# Patient Record
Sex: Female | Born: 1955 | Race: White | Hispanic: No | Smoking: Never smoker
Health system: Southern US, Community
[De-identification: ages and names within clinical notes are randomized; demographics above are authoritative.]

## PROBLEM LIST (undated history)

## (undated) DIAGNOSIS — L02211 Cutaneous abscess of abdominal wall: Secondary | ICD-10-CM

## (undated) DIAGNOSIS — Z9884 Bariatric surgery status: Secondary | ICD-10-CM

## (undated) DIAGNOSIS — E782 Mixed hyperlipidemia: Secondary | ICD-10-CM

## (undated) DIAGNOSIS — I10 Essential (primary) hypertension: Secondary | ICD-10-CM

## (undated) DIAGNOSIS — F419 Anxiety disorder, unspecified: Secondary | ICD-10-CM

## (undated) DIAGNOSIS — M797 Fibromyalgia: Secondary | ICD-10-CM

## (undated) DIAGNOSIS — F4323 Adjustment disorder with mixed anxiety and depressed mood: Secondary | ICD-10-CM

## (undated) HISTORY — PX: TONSILLECTOMY: SUR1361

## (undated) HISTORY — DX: Bariatric surgery status: Z98.84

## (undated) HISTORY — PX: APPENDECTOMY: SHX54

## (undated) HISTORY — DX: Fibromyalgia: M79.7

## (undated) HISTORY — DX: Essential (primary) hypertension: I10

## (undated) HISTORY — DX: Mixed hyperlipidemia: E78.2

## (undated) HISTORY — DX: Adjustment disorder with mixed anxiety and depressed mood: F43.23

## (undated) HISTORY — PX: GASTRIC BYPASS: SHX52

## (undated) HISTORY — DX: Anxiety disorder, unspecified: F41.9

## (undated) HISTORY — PX: WISDOM TOOTH EXTRACTION: SHX21

## (undated) HISTORY — PX: OTHER SURGICAL HISTORY: SHX169

---

## 2001-02-22 ENCOUNTER — Encounter: Admission: RE | Admit: 2001-02-22 | Discharge: 2001-02-22 | Payer: Self-pay | Admitting: Family Medicine

## 2001-02-22 ENCOUNTER — Encounter: Payer: Self-pay | Admitting: Family Medicine

## 2001-03-16 ENCOUNTER — Encounter: Payer: Self-pay | Admitting: Family Medicine

## 2001-03-16 ENCOUNTER — Encounter: Admission: RE | Admit: 2001-03-16 | Discharge: 2001-03-16 | Payer: Self-pay | Admitting: Family Medicine

## 2002-05-02 ENCOUNTER — Encounter: Payer: Self-pay | Admitting: Family Medicine

## 2002-05-02 ENCOUNTER — Encounter: Admission: RE | Admit: 2002-05-02 | Discharge: 2002-05-02 | Payer: Self-pay | Admitting: Family Medicine

## 2003-10-28 ENCOUNTER — Ambulatory Visit (HOSPITAL_COMMUNITY): Admission: RE | Admit: 2003-10-28 | Discharge: 2003-10-28 | Payer: Self-pay | Admitting: Internal Medicine

## 2004-02-07 ENCOUNTER — Other Ambulatory Visit: Admission: RE | Admit: 2004-02-07 | Discharge: 2004-02-07 | Payer: Self-pay | Admitting: Internal Medicine

## 2004-10-18 DIAGNOSIS — M797 Fibromyalgia: Secondary | ICD-10-CM

## 2004-10-18 HISTORY — DX: Fibromyalgia: M79.7

## 2004-10-18 HISTORY — PX: EXPLORATORY LAPAROTOMY: SUR591

## 2004-12-21 ENCOUNTER — Ambulatory Visit (HOSPITAL_COMMUNITY): Admission: RE | Admit: 2004-12-21 | Discharge: 2004-12-21 | Payer: Self-pay | Admitting: Internal Medicine

## 2005-01-05 ENCOUNTER — Ambulatory Visit (HOSPITAL_COMMUNITY): Admission: RE | Admit: 2005-01-05 | Discharge: 2005-01-05 | Payer: Self-pay | Admitting: Surgery

## 2005-01-05 ENCOUNTER — Encounter (INDEPENDENT_AMBULATORY_CARE_PROVIDER_SITE_OTHER): Payer: Self-pay | Admitting: Cardiology

## 2005-01-05 ENCOUNTER — Ambulatory Visit (HOSPITAL_COMMUNITY): Admission: RE | Admit: 2005-01-05 | Discharge: 2005-01-05 | Payer: Self-pay | Admitting: Internal Medicine

## 2005-01-05 ENCOUNTER — Ambulatory Visit (HOSPITAL_BASED_OUTPATIENT_CLINIC_OR_DEPARTMENT_OTHER): Admission: RE | Admit: 2005-01-05 | Discharge: 2005-01-05 | Payer: Self-pay | Admitting: Surgery

## 2005-01-05 ENCOUNTER — Ambulatory Visit: Payer: Self-pay | Admitting: Internal Medicine

## 2005-01-05 ENCOUNTER — Encounter: Admission: RE | Admit: 2005-01-05 | Discharge: 2005-04-05 | Payer: Self-pay | Admitting: Surgery

## 2005-03-02 ENCOUNTER — Inpatient Hospital Stay (HOSPITAL_COMMUNITY): Admission: RE | Admit: 2005-03-02 | Discharge: 2005-03-05 | Payer: Self-pay | Admitting: Surgery

## 2005-03-02 HISTORY — PX: GASTRIC BYPASS: SHX52

## 2005-04-16 ENCOUNTER — Encounter: Admission: RE | Admit: 2005-04-16 | Discharge: 2005-07-15 | Payer: Self-pay | Admitting: Surgery

## 2005-08-13 ENCOUNTER — Encounter: Admission: RE | Admit: 2005-08-13 | Discharge: 2005-09-17 | Payer: Self-pay | Admitting: Surgery

## 2005-08-18 HISTORY — PX: JEJUNOSTOMY REVISION: SHX1854

## 2005-09-10 ENCOUNTER — Inpatient Hospital Stay (HOSPITAL_COMMUNITY): Admission: EM | Admit: 2005-09-10 | Discharge: 2005-09-23 | Payer: Self-pay | Admitting: Emergency Medicine

## 2005-09-11 ENCOUNTER — Ambulatory Visit: Payer: Self-pay | Admitting: Internal Medicine

## 2005-11-22 ENCOUNTER — Encounter: Admission: RE | Admit: 2005-11-22 | Discharge: 2006-02-20 | Payer: Self-pay | Admitting: Surgery

## 2006-01-06 ENCOUNTER — Encounter: Admission: RE | Admit: 2006-01-06 | Discharge: 2006-01-06 | Payer: Self-pay | Admitting: Surgery

## 2006-01-07 ENCOUNTER — Inpatient Hospital Stay (HOSPITAL_COMMUNITY): Admission: RE | Admit: 2006-01-07 | Discharge: 2006-01-15 | Payer: Self-pay | Admitting: Surgery

## 2006-01-20 ENCOUNTER — Ambulatory Visit (HOSPITAL_COMMUNITY): Admission: RE | Admit: 2006-01-20 | Discharge: 2006-01-20 | Payer: Self-pay | Admitting: *Deleted

## 2006-03-29 ENCOUNTER — Encounter: Admission: RE | Admit: 2006-03-29 | Discharge: 2006-03-29 | Payer: Self-pay | Admitting: Surgery

## 2007-02-16 ENCOUNTER — Other Ambulatory Visit: Admission: RE | Admit: 2007-02-16 | Discharge: 2007-02-16 | Payer: Self-pay | Admitting: Internal Medicine

## 2007-02-21 ENCOUNTER — Ambulatory Visit (HOSPITAL_COMMUNITY): Admission: RE | Admit: 2007-02-21 | Discharge: 2007-02-21 | Payer: Self-pay | Admitting: Internal Medicine

## 2007-04-04 ENCOUNTER — Encounter: Admission: RE | Admit: 2007-04-04 | Discharge: 2007-04-04 | Payer: Self-pay | Admitting: Surgery

## 2007-04-07 ENCOUNTER — Encounter: Admission: RE | Admit: 2007-04-07 | Discharge: 2007-04-07 | Payer: Self-pay | Admitting: Internal Medicine

## 2007-06-13 ENCOUNTER — Encounter: Admission: RE | Admit: 2007-06-13 | Discharge: 2007-06-13 | Payer: Self-pay | Admitting: Surgery

## 2011-03-05 NOTE — H&P (Signed)
NAMECHEREESE, CILENTO               ACCOUNT NO.:  [id number]   MEDICAL RECORD NO.:  [id number]          PATIENT TYPE:  INP   LOCATION:  1830                         FACILITY:  [facility]   PHYSICIAN:  Sandria Bales. Ezzard Standing, M.D.  DATE OF BIRTH:  [birth year]   DATE OF ADMISSION:  09/10/2005  DATE OF DISCHARGE:                                HISTORY & PHYSICAL   HISTORY OF PRESENT ILLNESS:  This is a 55 year old white female whose  primary doctor is Dr. Marisue Brooklyn.  She underwent a successful bariatric  Roux-en-Y gastric bypass by Dr. Wenda Low in May, 2006.  Her preoperative  weight was estimated at 311.  She says she currently weighs about 200  pounds, so she has lost over 100 pounds since her initial surgery.  She was  doing well until yesterday evening when she started having severe abdominal  pain.  Now presents to the [facility] emergency room with worsening severe  abdominal pain.  She is accompanied by her partner, Eulogio Bear.   She has had no nausea or vomiting.  She has had no fever.  She has really  done very well from the surgery and has been very pleased with her surgery  until this acute episode.   She spoke with our office earlier today and they called in some Vicodin.  She took some Roxicet, but none of these seemed to help her abdominal pain.   Her only other prior abdominal surgery was an appendectomy.   PAST MEDICAL HISTORY:  She says ERYTHROMYCIN upsets her stomach but is not a  true allergy.  Again, she has been taking pain medicines as really her only  medicines and her bariatric vitamin pills, etc.   REVIEW OF SYSTEMS:  NEUROLOGIC:  No seizure or loss of consciousness.  PULMONARY:  Does not smoke cigarettes.  No pneumonia or tuberculosis.  CARDIAC:  No history of heart disease, chest pain, or hypertension.  GASTROINTESTINAL:  See history of present illness.  UROLOGIC:  No kidney stones or kidney infections.  She is accompanied by her  friend, Eulogio Bear.   PHYSICAL EXAMINATION:  VITAL SIGNS:  Blood pressure 90/60, pulse 120.  GENERAL:  She is alert but complaining of severe abdominal pain.  HEENT:  Unremarkable.  NECK:  Supple.  I find no mass or thyromegaly.  LUNGS:  Clear to auscultation, but poor inspiration secondary to abdominal  pain.  HEART:  Regular rhythm.  She is tachycardic.  ABDOMEN:  Very quiet.  Diffusely tender with guarding and rebound.  Mildly  distended.   I have evaluated her in the emergency room.  I have no labs back nor x-rays,  but she has a surgical abdomen and it is my concern that she has one of  two things.  Most likely, she has a perforated viscus, which is probably  secondary to a marginal ulcer.  We will plan labs and chest x-ray and will  be going immediately to the operating room.  Secondly, she has had ischemic  bowel.  I think with a sudden onset and no nausea or vomiting, I think  it is  more likely a perforated ulcer.   DIAGNOSIS:  1.  Acute surgical abdomen, probably perforated marginal ulcer:  I will plan      to take her to the operating room, first for a laparoscopic exploration.      I explained to the patient and her friend that if I cannot do this      laparoscopically, it has to be open.  There is a chance I would have to      revise her pouch, again, depending on the findings at the time of      surgery.  Risks include bleeding, infection (which she already has),      bowel resection/injury, and open surgery.  2.  Successful weight loss after Roux-en-Y gastric bypass.  Approximately      110 pounds.      Sandria Bales. Ezzard Standing, M.D.  Electronically Signed     DHN/MEDQ  D:  09/10/2005  T:  09/10/2005  Job:  [id number]   cc:   Lovenia Kim, D.O.  Fax: [phone]   [place] Daphine Deutscher, MD  [address], [address]  [place]  [place] [address]

## 2011-03-05 NOTE — Procedures (Signed)
Nicole Padilla, Nicole Padilla               ACCOUNT NO.:  [id number]   MEDICAL RECORD NO.:  [id number]          PATIENT TYPE:  OUT   LOCATION:  SLEEP CENTER                 FACILITY:  [facility]   PHYSICIAN:  Clinton D. Maple Hudson, M.D. DATE OF BIRTH:  [birth year]   DATE OF STUDY:  01/05/2005                              NOCTURNAL POLYSOMNOGRAM   REFERRING PHYSICIAN:  Dr. Luretha Murphy.   INDICATION FOR STUDY:  Hypersomnia with sleep apnea.   EPWORTH SLEEPINESS SCORE:  16/24   BMI:  51.   WEIGHT:  302 pounds.   SLEEP ARCHITECTURE:  Total sleep time 381 minutes with sleep efficiency 83%.  Stage I was 17%, Stage II 68%, Stages III and IV 2%, REM was 13% of total  sleep time. Sleep latency 13 minutes, REM latency 277 minutes.  Awake after  sleep onset 65 minutes, arousal index 18.   RESPIRATORY DATA:  Split study protocol.  Respiratory disturbance index  (RDI) 54.7 obstructive events per hour indicating severe obstructive sleep  apnea/hypopnea syndrome before CPAP.  There were 14 obstructive apneas and  105 hypopneas before CPAP control.  Most events and most initial sleep were  while supine.  REM RDI 3.6.  CPAP was titrated to 18 CWP mainly to control  snoring.  Adequate control of obstructive events was achieved by 12 CWP,  which would be more comfortable.  A medium Respironics Comfort II full face  mask was used with heated humidifier.   OXYGEN DATA:  The patient arrived with nasal congestion and a cough.  She  said it is chronic. She took Nyquil to help with this at the beginning of  sleep.  Snoring was noted with oxygen desaturation during studies to a nadir  of 85% with events.  After CPAP control, oxygen saturation held 96-98% on  room air.   CARDIAC DATA:  Sinus rhythm with occasional PAC.   MOVEMENT/PARASOMNIA:  A total of 122 limb jerks were recorded, of which 14  were associated with arousal or awakening for periodic limb movement with  arousal index of 2.2/hr, which is probably not  significant.   IMPRESSION/RECOMMENDATION:  1.  Severe obstructive sleep apnea/hypopnea syndrome.  Respiratory      disturbance index 54.7/hr with oxygen desaturation to 85% with snoring.  2.  CPAP titration to a suggested initial pressure of 12 CWP, respiratory      disturbance index 0/hr using a medium Respironics Comfort II full face      mask with heated humidifier.  3.  No comments about nasal congestion and cough, for which the patient took      Nyquil at the beginning of the study.      CDY/MEDQ  D:  01/10/2005 10:44:42  T:  01/11/2005 07:24:45  Job:  [id number]

## 2011-03-05 NOTE — Consult Note (Signed)
Nicole Padilla, Nicole Padilla               ACCOUNT NO.:  [id number]   MEDICAL RECORD NO.:  [id number]          PATIENT TYPE:  INP   LOCATION:  2310                         FACILITY:  [facility]   PHYSICIAN:  Madeleine B. Vanstory, M.D.DATE OF BIRTH:  24-May-1956   DATE OF CONSULTATION:  09/15/2005  DATE OF DISCHARGE:                                   CONSULTATION   CHIEF COMPLAINT:  Acute renal failure.   HISTORY OF PRESENT ILLNESS:  This is a 55 year old female admitted with  severe abdominal pain thought to be a perforated marginal ulcer. She is  status post gastric bypass in May 2006. The patient received surgical repair  of her jejunal anastomosis perforation with omental patch on September 10, 2005. The patient was septic after the procedure. The patient developed  acute renal failure. Today's creatinine was 4.0, down from 4.3, from 4.1,  baseline creatinine was 0.7 in May 2006. The patient is not currently on any  nephrotoxins and has not received contrast. The patient does have a history  of hypertension, somewhat uncontrolled and occasional NSAID use about 800 mg  no more than twice a week. No previous kidney disease.   PAST MEDICAL HISTORY:  1.  Obstructive sleep apnea.  2.  Obesity.  3.  Status post gastric bypass in May 2006.  4.  Hypertension.   MEDICATIONS:  The patient's medications in the hospital include Xigris,  heparin, hydrocortisone, Protonix, Zosyn, morphine. The patient has received  Lasix 80 mg IV times three; was previously on Levophed which has now been  weaned off. The patient was on lisinopril as an outpatient.   FAMILY HISTORY:  Negative for kidney disease. Questionable CAD and  hypertension.   SOCIAL HISTORY:  The patient lives with partner, Kennyth Arnold; enjoys gardening.   REVIEW OF SYSTEMS:  She is thirsty and has diffuse abdominal pain, but no  chest pain or shortness of breath, fever, chills.   PHYSICAL EXAMINATION:  VITAL SIGNS: Temperature 98.2, blood  pressure 110 to  130 over 50s to 60s, heart rate 70s, respiratory rate 15. She is 100% on two  liters nasal cannula.  GENERAL: She is a Caucasian female in no acute distress, alert and oriented,  but tangential.  HEENT: Dry mucous membranes. Extraocular movements intact. Pupils equal,  round, and reactive to light.  CARDIOVASCULAR: Regular rate and rhythm. No murmurs, rubs, or gallops.  RESPIRATORY: Clear to auscultation bilaterally, anteriorly.  ABDOMEN: Her dressing is clean, dry, and intact. She is appropriately tender  status post surgery.  EXTREMITIES: No edema.  NEUROLOGIC: Cranial nerves II-XII intact.  MUSCULOSKELETAL: 5+ strength bilaterally.   LABORATORY DATA:  Sodium 150, potassium 2.7, creatinine 4.0, glucose 123,  calcium 7.6.  Hemoglobin 8.6, white count 8.4.   ASSESSMENT/PLAN:  A 55 year old Caucasian female with:  1.  Acute renal failure likely prerenal in a patient with abdominal pain and      decreased p.o. intake prior to admission. She is status post surgery,      likely dehydrated. The patient is KVO with dry mucous membranes.      Creatinine baseline of  0.7 and currently 4.0. Urine output was scant,      previously oliguric on the 24th. Now, urine output 200 mL to 300 mL an      hour. She has been out 1 liter in the last 8 hours. The patient will      improve with IV fluids and/or increased p.o. Will follow creatinine and      urine output.  2.  Hypokalemia. Repleting her primary potassium.  3.  Sepsis. On Xigris for ___________.  4.  Anemia. Will follow.      Tawnya Crook Erenest Rasher, M.D.     MBV/MEDQ  D:  09/15/2005  T:  09/15/2005  Job:  [id number]

## 2011-03-05 NOTE — Discharge Summary (Signed)
NAMECRYSTALYNN, Nicole Padilla               ACCOUNT NO.:  [id number]   MEDICAL RECORD NO.:  [id number]          PATIENT TYPE:  INP   LOCATION:  1506                         FACILITY:  [facility]   PHYSICIAN:  Thornton Park. Daphine Deutscher, MD  DATE OF BIRTH:  November 06, 1955   DATE OF ADMISSION:  01/07/2006  DATE OF DISCHARGE:  01/15/2006                                 DISCHARGE SUMMARY   CHIEF COMPLAINT:  Pelvic infection.   HISTORY:  This is a 55 year old white female who got a perforated  jejunojejunostomy 6 months out from a lap gastric bypass.  She was operated  on by Dr. Ovidio Kin at [facility] in November with laparotomy and  irrigation.  Recently, she has been feeling tired and slow and began having  more pain in her pelvis, and a CT scan showed fluid collections consistent  with possible abscess, and she was admitted for aspiration and potential  drainage.   COURSE IN THE HOSPITAL:  The patient underwent an aspiration initially and  was put on broad spectrum antibiotics including Unasyn and subsequently for  a while vancomycin, thinking she might be growing some MRSA, but this did  not prove to be the case seeing as her gram-positive cocci in her pelvis  were probably microaerophilic strep and spectrum labs unfortunately could  give [place] no better delineation of their species than that.  She was kept on  Unasyn throughout her hospitalization.  Her drains were removed on January 14, 2006, which had been placed and draining two areas of loculation; one  inferior and deeper and another one was more superficial that this one came  in the left lower quadrant.  She was discharged to home on Augmentin elixir  875 b.i.d., Roxicet elixir for pain, to follow up in the office, and to have  radiology repeat CT scan on January 20, 2006.   FINAL DIAGNOSIS:  Pelvic abscess, status post percutaneous aspiration and  drainage, growing microaerophilic strep.      Thornton Park Daphine Deutscher, MD  Electronically Signed     MBM/MEDQ  D:  01/23/2006  T:  01/24/2006  Job:  [id number]   cc:   Lovenia Kim, D.O.  Fax: [phone]

## 2011-03-05 NOTE — Op Note (Signed)
Nicole Padilla, DELANCEY               ACCOUNT NO.:  [id number]   MEDICAL RECORD NO.:  [id number]          PATIENT TYPE:  INP   LOCATION:  2550                         FACILITY:  [facility]   PHYSICIAN:  Sandria Bales. Ezzard Standing, M.D.  DATE OF BIRTH:  April 07, 1956   DATE OF PROCEDURE:  09/10/2005  DATE OF DISCHARGE:                                 OPERATIVE REPORT   PREOPERATIVE DIAGNOSIS:  Pneumoperitoneum/Surgical abdomen   POSTOPERATIVE DIAGNOSIS:  Perforation at jejunojejunal anastomosis with an  approximate 1-cm defect.   PROCEDURES:  1.  Laparoscopic converted into open laparotomy, oversew of jejunojejunal      perforation with omental patch, irrigation of approximately 25 L of      saline.  2.  Esophagogastroscopy.   SURGEON:  Sandria Bales. Ezzard Standing, M.D.   FIRST ASSISTANT:  Angelia Mould. Derrell Lolling, M.D.   ANESTHESIA:  General endotracheal anesthesia.   ESTIMATED BLOOD LOSS:  100 mL.   DRAINS LEFT IN:  None.   INDICATION FOR PROCEDURE:  Mr. Bentivegna is a 55 year old white female who  underwent a laparoscopic Roux-en-Y gastric bypass by Dr. Wenda Low in May  of 2006 for morbid obesity.  She has lost approximately 110 pounds from an  initial weight of 311 down to approximately 200 pounds  now.  She was doing  well until yesterday, when she developed acute abdominal pain, which has  worsened.  She presented today to the emergency room after talking to me on  the phone.  She had diffuse peritoneal signs and I felt she had a risk of  either perforated viscus or ischemic bowel, and we talked about going  straight to the operating room.   The indications and potential complications were explained to the patient;  the potential complications include, but are not limited to, bleeding,  infection, the need for a trial at laparoscopic or possible open surgery and  the life-threatening nature of this illness.   She is with her significant-other.   OPERATIVE NOTE:  The patient was placed in a supine  position and given a  general endotracheal anesthetic and supervised by Dr. Sharee Holster.  Both  her arms were out to her side.  She had a Foley catheter in place and PAS  stockings in place.  Dr. Jacklynn Bue put in a central line for [place] initially at  the procedure.   I prepped her abdomen with Betadine solution and sterilely draped it.  I  started out with an infraumbilical incision and with sharp dissection,  carried it down to the abdominal cavity and inserted a 10-mm laparoscope  through a 12-mm Hasson trocar, and the Hasson trocar was secured with a 0  Vicryl suture.  I explored her abdomen; she had massive contamination of  foul-smelling bile-stained fluid in the left upper quadrant, right upper  quadrant, left lower quadrant and right lower quadrant.  I put a 10-mm in  the right upper quadrant to see if I could at all identify where the  perforation was, but she had so much contamination, it was clear for her to  be adequately irrigated out and have  to do an open laparotomy, so I  converted her to an open laparotomy.   I took out the trocars, made an extended midline incision and with sharp  dissection, carried down to the abdominal cavity.  Upon entering the  abdominal cavity, the patient had an adhesive band to her jejunojejunostomy  and it was at this point where she had a perforation of about a 1- to 1.2-cm  hole in the anterior bowel.  There was no evidence of ischemia, there was no  evidence of any obstruction and this perforation was on the antimesenteric  side of the bowel at the site of the anastomosis of the jejuno-jejunostomy.  The jejuno-jejunostomy was widely patent.   The first thing I did was to control the soilage, I placed about 5 or 6  sutures of 2-0 silk to close the perforation.  I then spent the next hour  irrigating out the abdomen, placing about 25 L of saline.  She had  particulate matter, food matter, broccoli, corn, grapes, all kind of food  matter in  all 4 quadrants.  After irrigating the abdomen out thoroughly, I  then was able to run the bowel.  I was able to get up into the gastric  pouch, which looked good.  Her gastrojejunostomy looked good.  I ran her  small bowel down to the jejunojejunostomy; this all looked good; again,  there was no dilatation, there was no obstruction, there was no obvious  inflammation.  I then ran back to the ligament of Treitz with the biliary  limbs, which were unremarkable, then went distally down to the terminal  ileum and this was unremarkable.  The reason for the perforation was unclear  to me from what I could see.   I then did an upper endoscopy to make sure her gastric pouch looked okay and  there was no other evidence of ulcer disease.  I was able to scope her with  an Olympus endoscope passed per oral; this took about 10 minutes.  I got  down into the gastric pouch.  Dr. Derrell Lolling clamped off the small bowel so I  did not distend her whole small bowel.  I visualized her esophagogastric  junction at about 38-39 cm.  I identified her gastrojejunal anastomosis at  about 45 cm for about a 6-cm pouch.  The pouch was tubular, I did not think  overly large and the gastric mucosa and the anastomosis looked good without  any evidence of ulceration.  I then cannulated both the blind pouch and the  distal pouch and went down probably 15-20 cm into the small bowel, and saw  no ulcer disease or inflammation in this area of small bowel.   So I saw no proximal cause of her ulcer disease.  I then withdraw the scope  while we decompressed the air.  I then rescrubbed.  We had already closed  the defect.  I then placed about 2.5 mL of Tisseel over the perforation, and  then I put a patch of omentum over this.   I then closed the abdomen in 2 running #1 PDS sutures.  I put some  interrupted #1 PDS sutures to help as internal retentions, then I placed 3 retention sutures using #5 Ethibond sutures over bridges.   Because of the  massive contamination, I left her wound packed opened with Betadine gauze.  I closed the single port here the 10-mm had been to look in the belly with a  single 2-0  nylon suture.   The patient tolerated the procedure well, was extubated and transported to  the recovery room.  Sponge and needle counts were correct during the case.  I think the patient is a lot better postop.      Sandria Bales. Ezzard Standing, M.D.  Electronically Signed     DHN/MEDQ  D:  09/10/2005  T:  09/11/2005  Job:  [id number]   cc:   Lovenia Kim, D.O.  Fax: [phone]   Thornton Park Daphine Deutscher, MD  [address]  [place]  [place] [address]

## 2013-07-05 ENCOUNTER — Other Ambulatory Visit (HOSPITAL_COMMUNITY)
Admission: RE | Admit: 2013-07-05 | Discharge: 2013-07-05 | Disposition: A | Discharge status: Home / Self Care | Payer: Managed Care, Other (non HMO) | Source: Ambulatory Visit | Attending: Internal Medicine | Admitting: Internal Medicine

## 2013-07-05 DIAGNOSIS — Z01419 Encounter for gynecological examination (general) (routine) without abnormal findings: Secondary | ICD-10-CM | POA: Insufficient documentation

## 2013-09-20 ENCOUNTER — Encounter: Payer: Self-pay | Admitting: Emergency Medicine

## 2013-11-18 ENCOUNTER — Encounter: Payer: Self-pay | Admitting: *Deleted

## 2013-11-18 DIAGNOSIS — Z9884 Bariatric surgery status: Secondary | ICD-10-CM | POA: Insufficient documentation

## 2013-11-18 DIAGNOSIS — M797 Fibromyalgia: Secondary | ICD-10-CM | POA: Insufficient documentation

## 2013-11-22 ENCOUNTER — Ambulatory Visit (INDEPENDENT_AMBULATORY_CARE_PROVIDER_SITE_OTHER): Payer: Managed Care, Other (non HMO) | Admitting: Emergency Medicine

## 2013-11-22 ENCOUNTER — Encounter: Payer: Self-pay | Admitting: Emergency Medicine

## 2013-11-22 VITALS — BP 150/98 | HR 94 | Temp 98.6°F | Resp 18 | Ht 63.75 in | Wt 254.0 lb

## 2013-11-22 DIAGNOSIS — E782 Mixed hyperlipidemia: Secondary | ICD-10-CM

## 2013-11-22 DIAGNOSIS — R5381 Other malaise: Secondary | ICD-10-CM

## 2013-11-22 DIAGNOSIS — R5383 Other fatigue: Secondary | ICD-10-CM

## 2013-11-22 DIAGNOSIS — L918 Other hypertrophic disorders of the skin: Secondary | ICD-10-CM

## 2013-11-22 DIAGNOSIS — L919 Hypertrophic disorder of the skin, unspecified: Secondary | ICD-10-CM

## 2013-11-22 DIAGNOSIS — I10 Essential (primary) hypertension: Secondary | ICD-10-CM

## 2013-11-22 DIAGNOSIS — R7309 Other abnormal glucose: Secondary | ICD-10-CM

## 2013-11-22 DIAGNOSIS — R350 Frequency of micturition: Secondary | ICD-10-CM

## 2013-11-22 DIAGNOSIS — N3000 Acute cystitis without hematuria: Secondary | ICD-10-CM

## 2013-11-22 DIAGNOSIS — E559 Vitamin D deficiency, unspecified: Secondary | ICD-10-CM

## 2013-11-22 DIAGNOSIS — Z79899 Other long term (current) drug therapy: Secondary | ICD-10-CM

## 2013-11-22 DIAGNOSIS — L909 Atrophic disorder of skin, unspecified: Secondary | ICD-10-CM

## 2013-11-22 DIAGNOSIS — D229 Melanocytic nevi, unspecified: Secondary | ICD-10-CM

## 2013-11-22 DIAGNOSIS — D239 Other benign neoplasm of skin, unspecified: Secondary | ICD-10-CM

## 2013-11-22 DIAGNOSIS — F4323 Adjustment disorder with mixed anxiety and depressed mood: Secondary | ICD-10-CM

## 2013-11-22 LAB — CBC WITH DIFFERENTIAL/PLATELET
Basophils Absolute: 0 10*3/uL (ref 0.0–0.1)
Basophils Relative: 0 % (ref 0–1)
Eosinophils Absolute: 0 10*3/uL (ref 0.0–0.7)
Eosinophils Relative: 1 % (ref 0–5)
HEMATOCRIT: 41.4 % (ref 36.0–46.0)
Hemoglobin: 13.8 g/dL (ref 12.0–15.0)
LYMPHS PCT: 27 % (ref 12–46)
Lymphs Abs: 2.2 10*3/uL (ref 0.7–4.0)
MCH: 27.1 pg (ref 26.0–34.0)
MCHC: 33.3 g/dL (ref 30.0–36.0)
MCV: 81.2 fL (ref 78.0–100.0)
MONO ABS: 0.4 10*3/uL (ref 0.1–1.0)
Monocytes Relative: 5 % (ref 3–12)
Neutro Abs: 5.5 10*3/uL (ref 1.7–7.7)
Neutrophils Relative %: 67 % (ref 43–77)
Platelets: 302 10*3/uL (ref 150–400)
RBC: 5.1 MIL/uL (ref 3.87–5.11)
RDW: 14.7 % (ref 11.5–15.5)
WBC: 8.1 10*3/uL (ref 4.0–10.5)

## 2013-11-22 LAB — HEMOGLOBIN A1C
HEMOGLOBIN A1C: 5.9 % — AB (ref <5.7)
MEAN PLASMA GLUCOSE: 123 mg/dL — AB (ref <117)

## 2013-11-22 MED ORDER — CIPROFLOXACIN HCL 500 MG PO TABS: 500.0000 mg | ORAL_TABLET | Freq: Two times a day (BID) | ORAL | Status: AC

## 2013-11-22 MED ORDER — CIPROFLOXACIN HCL 500 MG PO TABS
500.0000 mg | ORAL_TABLET | Freq: Two times a day (BID) | ORAL | Status: DC
Start: 2013-11-22 — End: 2013-11-22

## 2013-11-22 MED ORDER — DIAZEPAM 2 MG PO TABS: 2.0000 mg | ORAL_TABLET | Freq: Three times a day (TID) | ORAL | Status: DC | PRN

## 2013-11-22 NOTE — Progress Notes (Signed)
Subjective:    Patient ID: Nicole Padilla, female    DOB: [birth year], 58 y.o.   MRN: [id number]  HPI Comments: 58 yo obese female presents for 3 month F/U for Cholesterol, Pre-Dm, D. Deficient. She is trying to eat healthier. She has tried to increase activity outside of work but does not seem to keep routine schedule. She is very active with work. LAST LABS T 180 TG 62 L 101 MAG 2 A1C 5.8 D 89   She also wants/ needs several moles/ skin tags removed. She was noted to have dark moles at last OV and needs to have them removed and sent for pathology. She notes + change of pigment. She notes the skin tags are underneath left breast and stay irritated due to her bra rubbing them.  She has had increased stress with 2nd job which increases her FM pain and makes her feel in less control. She notes these increase her anxiety. She notes she has a good life and she needs to d/c 2nd job but feels guilty about letting it go.   She has been more fatigued with stress but has also noted increased urine frequency with mild odor. She denies any other UTI symptoms or fever. She has been drinking more water and trying to decrease soda/ tea.     Review of Systems  Constitutional: Positive for fatigue.  Genitourinary: Positive for frequency.  Musculoskeletal: Positive for arthralgias and myalgias.  Skin: Positive for color change.  Psychiatric/Behavioral: Negative for suicidal ideas. The patient is nervous/anxious.   All other systems reviewed and are negative.       Objective:   Physical Exam  Nursing note and vitals reviewed. Constitutional: She is oriented to person, place, and time. She appears well-developed and well-nourished. No distress.  Obese  HENT:  Head: Normocephalic and atraumatic.  Right Ear: External ear normal.  Left Ear: External ear normal.  Nose: Nose normal.  Mouth/Throat: Oropharynx is clear and moist.  Eyes: Conjunctivae and EOM are normal.  Neck: Normal range of motion.  Neck supple. No JVD present. No thyromegaly present.  Cardiovascular: Normal rate, regular rhythm, normal heart sounds and intact distal pulses.   Pulmonary/Chest: Effort normal and breath sounds normal.  Abdominal: Soft. Bowel sounds are normal. She exhibits no distension and no mass. There is no tenderness. There is no rebound and no guarding.  Musculoskeletal: Normal range of motion. She exhibits no edema and no tenderness.  Lymphadenopathy:    She has no cervical adenopathy.  Neurological: She is alert and oriented to person, place, and time. No cranial nerve deficit.  Skin: Skin is warm and dry. No rash noted. No erythema. No pallor.     Moles-Right post shoulder and right chest flat dark irreg borders approx 3-29mm Each. Skin tags- Erythematous with bra irritation present. Under left breast x 3 removed with electrocautery   Psychiatric: She has a normal mood and affect. Her behavior is normal. Judgment and thought content normal.  Tearful, but appropriate responses. She seems overwhelmed.           Assessment & Plan:  1.  Irreg/ atypical moles- Verbal permission obtained. Areas prepped with alcohol. 1% lidocaine used at each area approx 1 cc ea. Shave excision performed with clear borders obtained and sent for pathology. Area bandaged with Neosporin/ guaze/ paper tape. Wound hygiene given. SX of infection explained pt w/c with any concerns. 2. Skin tags- Area prepped with Alcohol and .5cc of Lidocaine and electrocautery used to  remove. Areas bandaged as above. 3. 3 month F/U for Obesity, Cholesterol, Pre-Dm, D. Deficient. Needs healthy diet, cardio QD and obtain healthy weight. Check Labs, Check BP if >130/80 call office 4. FM/ Depression/ anxiety vs Fatigue- check labs, increase activity and H2O. Advised needs counseling and to d/c 2nd job which seems to be increasing stress load. May switch from [facility] to valium to see if any improvement with symptoms vs add on mood stabilizer if no  relief 5. Frequency- Check labs, increase H20, bland diet   OVER 40 minutes of exam, counseling, chart review, referral performed

## 2013-11-22 NOTE — Patient Instructions (Signed)
Wound Care Wound care helps prevent pain and infection.  You may need a tetanus shot if:  You cannot remember when you had your last tetanus shot.  You have never had a tetanus shot.  The injury broke your skin. If you need a tetanus shot and you choose not to have one, you may get tetanus. Sickness from tetanus can be serious. HOME CARE   Only take medicine as told by your doctor.  Clean the wound daily with mild soap and water.  Change any bandages (dressings) as told by your doctor.  Put medicated cream and a bandage on the wound as told by your doctor.  Change the bandage if it gets wet, dirty, or starts to smell.  Take showers. Do not take baths, swim, or do anything that puts your wound under water.  Rest and raise (elevate) the wound until the pain and puffiness (swelling) are better.  Keep all doctor visits as told. GET HELP RIGHT AWAY IF:   Yellowish-white fluid (pus) comes from the wound.  Medicine does not lessen your pain.  There is a red streak going away from the wound.  You have a fever. MAKE SURE YOU:   Understand these instructions.  Will watch your condition.  Will get help right away if you are not doing well or get worse. Document Released: 07/13/2008 Document Revised: 12/27/2011 Document Reviewed: 02/07/2011 [employer] Patient Information 2014 [employer].  

## 2013-11-23 LAB — IRON AND TIBC
%SAT: 8 % — ABNORMAL LOW (ref 20–55)
Iron: 32 ug/dL — ABNORMAL LOW (ref 42–145)
TIBC: 380 ug/dL (ref 250–470)
UIBC: 348 ug/dL (ref 125–400)

## 2013-11-23 LAB — BASIC METABOLIC PANEL WITHOUT GFR
BUN: 18 mg/dL (ref 6–23)
CO2: 29 meq/L (ref 19–32)
Calcium: 9.6 mg/dL (ref 8.4–10.5)
Chloride: 102 meq/L (ref 96–112)
Creat: 0.73 mg/dL (ref 0.50–1.10)
GFR, Est African American: >89 mL/min
GFR, Est Non African American: >89 mL/min
Glucose, Bld: 85 mg/dL (ref 70–99)
Potassium: 5 meq/L (ref 3.5–5.3)
Sodium: 139 meq/L (ref 135–145)

## 2013-11-23 LAB — MAGNESIUM: Magnesium: 1.8 mg/dL (ref 1.5–2.5)

## 2013-11-23 LAB — HEPATIC FUNCTION PANEL
ALT: 16 U/L (ref 0–35)
AST: 18 U/L (ref 0–37)
Albumin: 3.9 g/dL (ref 3.5–5.2)
Alkaline Phosphatase: 73 U/L (ref 39–117)
Bilirubin, Direct: 0.1 mg/dL (ref 0.0–0.3)
Indirect Bilirubin: 0.1 mg/dL — ABNORMAL LOW (ref 0.2–1.2)
Total Bilirubin: 0.2 mg/dL (ref 0.2–1.2)
Total Protein: 6.8 g/dL (ref 6.0–8.3)

## 2013-11-23 LAB — LIPID PANEL
CHOLESTEROL: 204 mg/dL — AB (ref 0–200)
HDL: 78 mg/dL (ref >39)
LDL Cholesterol: 109 mg/dL — ABNORMAL HIGH (ref 0–99)
TRIGLYCERIDES: 84 mg/dL (ref <150)
Total CHOL/HDL Ratio: 2.6 Ratio
VLDL: 17 mg/dL (ref 0–40)

## 2013-11-23 LAB — TSH: TSH: 1.528 u[IU]/mL (ref 0.350–4.500)

## 2013-11-23 LAB — VITAMIN D 25 HYDROXY (VIT D DEFICIENCY, FRACTURES): Vit D, 25-Hydroxy: 81 ng/mL (ref 30–89)

## 2013-11-23 LAB — URINALYSIS, ROUTINE W REFLEX MICROSCOPIC
BILIRUBIN URINE: NEGATIVE
GLUCOSE, UA: NEGATIVE mg/dL
HGB URINE DIPSTICK: NEGATIVE
Leukocytes, UA: NEGATIVE
Nitrite: NEGATIVE
PROTEIN: NEGATIVE mg/dL
Specific Gravity, Urine: 1.027 (ref 1.005–1.030)
Urobilinogen, UA: 0.2 mg/dL (ref 0.0–1.0)
pH: 6 (ref 5.0–8.0)

## 2013-11-23 LAB — URINE CULTURE
Colony Count: NO GROWTH
ORGANISM ID, BACTERIA: NO GROWTH

## 2013-11-23 LAB — VITAMIN B12: Vitamin B-12: 357 pg/mL (ref 211–911)

## 2013-11-23 LAB — INSULIN, FASTING: Insulin fasting, serum: 12 u[IU]/mL (ref 3–28)

## 2013-11-25 ENCOUNTER — Encounter: Payer: Self-pay | Admitting: Emergency Medicine

## 2013-11-25 DIAGNOSIS — F4323 Adjustment disorder with mixed anxiety and depressed mood: Secondary | ICD-10-CM | POA: Insufficient documentation

## 2013-11-25 DIAGNOSIS — E782 Mixed hyperlipidemia: Secondary | ICD-10-CM

## 2013-11-25 HISTORY — DX: Adjustment disorder with mixed anxiety and depressed mood: F43.23

## 2013-11-25 HISTORY — DX: Mixed hyperlipidemia: E78.2

## 2013-11-29 NOTE — Progress Notes (Signed)
LMOM TO CALL TO SCHEDULE  

## 2014-01-01 NOTE — Progress Notes (Signed)
[facility] FOR PT TO CALL

## 2014-01-01 NOTE — Progress Notes (Signed)
NO RESPONSE

## 2014-01-10 ENCOUNTER — Telehealth: Payer: Self-pay | Admitting: *Deleted

## 2014-01-10 NOTE — Telephone Encounter (Signed)
REFILLS   CYMBALTA 60MG   CYMBALTA 30MG       90 DAY SUPPLY. BOTH STRENGTHS PTS ON 90MG   AS NEEDED NEEDS BOTH CALLED IN    [facility] [address]  [phone]

## 2014-01-11 ENCOUNTER — Other Ambulatory Visit: Payer: Self-pay | Admitting: Emergency Medicine

## 2014-01-11 MED ORDER — DULOXETINE HCL 60 MG PO CPEP: 60.0000 mg | ORAL_CAPSULE | Freq: Every day | ORAL | Status: DC

## 2014-01-11 MED ORDER — DULOXETINE HCL 30 MG PO CPEP: 30.0000 mg | ORAL_CAPSULE | Freq: Every day | ORAL | Status: DC

## 2014-01-15 ENCOUNTER — Other Ambulatory Visit: Payer: Self-pay | Admitting: Emergency Medicine

## 2014-01-15 DIAGNOSIS — R6889 Other general symptoms and signs: Secondary | ICD-10-CM

## 2014-01-15 DIAGNOSIS — D649 Anemia, unspecified: Secondary | ICD-10-CM

## 2014-01-16 ENCOUNTER — Other Ambulatory Visit: Payer: Self-pay

## 2014-01-24 ENCOUNTER — Other Ambulatory Visit: Payer: Managed Care, Other (non HMO)

## 2014-01-24 DIAGNOSIS — D649 Anemia, unspecified: Secondary | ICD-10-CM

## 2014-01-24 DIAGNOSIS — R6889 Other general symptoms and signs: Secondary | ICD-10-CM

## 2014-01-24 LAB — MAGNESIUM: MAGNESIUM: 2 mg/dL (ref 1.5–2.5)

## 2014-01-24 LAB — IRON AND TIBC
%SAT: 26 % (ref 20–55)
IRON: 92 ug/dL (ref 42–145)
TIBC: 359 ug/dL (ref 250–470)
UIBC: 267 ug/dL (ref 125–400)

## 2014-01-24 LAB — VITAMIN B12: Vitamin B-12: 375 pg/mL (ref 211–911)

## 2014-03-21 ENCOUNTER — Encounter: Payer: Self-pay | Admitting: Emergency Medicine

## 2014-03-21 ENCOUNTER — Ambulatory Visit (INDEPENDENT_AMBULATORY_CARE_PROVIDER_SITE_OTHER): Payer: Managed Care, Other (non HMO) | Admitting: Emergency Medicine

## 2014-03-21 VITALS — BP 166/92 | HR 86 | Temp 98.4°F | Resp 18 | Ht 63.75 in | Wt 256.0 lb

## 2014-03-21 DIAGNOSIS — R5381 Other malaise: Secondary | ICD-10-CM

## 2014-03-21 DIAGNOSIS — I10 Essential (primary) hypertension: Secondary | ICD-10-CM

## 2014-03-21 DIAGNOSIS — R5383 Other fatigue: Secondary | ICD-10-CM

## 2014-03-21 DIAGNOSIS — E782 Mixed hyperlipidemia: Secondary | ICD-10-CM

## 2014-03-21 DIAGNOSIS — E559 Vitamin D deficiency, unspecified: Secondary | ICD-10-CM

## 2014-03-21 DIAGNOSIS — D649 Anemia, unspecified: Secondary | ICD-10-CM

## 2014-03-21 DIAGNOSIS — Z79899 Other long term (current) drug therapy: Secondary | ICD-10-CM

## 2014-03-21 DIAGNOSIS — R7309 Other abnormal glucose: Secondary | ICD-10-CM

## 2014-03-21 DIAGNOSIS — E538 Deficiency of other specified B group vitamins: Secondary | ICD-10-CM

## 2014-03-21 LAB — CBC WITH DIFFERENTIAL/PLATELET
Basophils Absolute: 0 10*3/uL (ref 0.0–0.1)
Basophils Relative: 0 % (ref 0–1)
EOS ABS: 0.1 10*3/uL (ref 0.0–0.7)
Eosinophils Relative: 1 % (ref 0–5)
HCT: 41.8 % (ref 36.0–46.0)
HEMOGLOBIN: 14.1 g/dL (ref 12.0–15.0)
LYMPHS ABS: 2.1 10*3/uL (ref 0.7–4.0)
Lymphocytes Relative: 32 % (ref 12–46)
MCH: 28.1 pg (ref 26.0–34.0)
MCHC: 33.7 g/dL (ref 30.0–36.0)
MCV: 83.4 fL (ref 78.0–100.0)
MONOS PCT: 5 % (ref 3–12)
Monocytes Absolute: 0.3 10*3/uL (ref 0.1–1.0)
NEUTROS PCT: 62 % (ref 43–77)
Neutro Abs: 4 10*3/uL (ref 1.7–7.7)
Platelets: 270 10*3/uL (ref 150–400)
RBC: 5.01 MIL/uL (ref 3.87–5.11)
RDW: 14.9 % (ref 11.5–15.5)
WBC: 6.5 10*3/uL (ref 4.0–10.5)

## 2014-03-21 LAB — HEMOGLOBIN A1C
Hgb A1c MFr Bld: 5.7 % — ABNORMAL HIGH (ref <5.7)
Mean Plasma Glucose: 117 mg/dL — ABNORMAL HIGH (ref <117)

## 2014-03-21 LAB — IRON AND TIBC
%SAT: 16 % — ABNORMAL LOW (ref 20–55)
Iron: 56 ug/dL (ref 42–145)
TIBC: 347 ug/dL (ref 250–470)
UIBC: 291 ug/dL (ref 125–400)

## 2014-03-21 LAB — MAGNESIUM: Magnesium: 2 mg/dL (ref 1.5–2.5)

## 2014-03-21 MED ORDER — FUROSEMIDE 40 MG PO TABS: 40.0000 mg | ORAL_TABLET | Freq: Every day | ORAL | Status: DC

## 2014-03-21 MED ORDER — NAPROXEN-ESOMEPRAZOLE 500-20 MG PO TBEC: DELAYED_RELEASE_TABLET | ORAL | Status: DC

## 2014-03-21 MED ORDER — DIAZEPAM 2 MG PO TABS: 2.0000 mg | ORAL_TABLET | Freq: Three times a day (TID) | ORAL | Status: AC | PRN

## 2014-03-21 MED ORDER — VITAMIN D (ERGOCALCIFEROL) 1.25 MG (50000 UNIT) PO CAPS: 50000.0000 [IU] | ORAL_CAPSULE | Freq: Every day | ORAL | Status: DC

## 2014-03-21 NOTE — Progress Notes (Signed)
Subjective:    Patient ID: Nicole Padilla, female    DOB: 11/24/1955, 59 y.o.   MRN: [id number]  HPI Comments: 58 yo WF presents for 3 month F/U for HTN, Cholesterol, Pre-Dm, D. Deficient. She is not exercising but keeps busy. She is not eating healthy. She is always tired. She has had negative sleep study in past but notes it was borderline.  She has noticed increased swelling in right hand and foot usually in a.m. She has noticed increased dull sensation in fingers. She has had false + autoimmune panel with evaluation by Rheum. She has sister with MS diagnosed at age 74. She notes she feels better with warmer temperatures. She notes grandmother had raynaud's.  WBC             8.1   11/22/2013 HGB            13.8   11/22/2013 HCT            41.4   11/22/2013 PLT             302   11/22/2013 GLUCOSE          85   11/22/2013 CHOL            204   11/22/2013 TRIG             84   11/22/2013 HDL              78   11/22/2013 LDLCALC         109   11/22/2013 ALT              16   11/22/2013 AST              18   11/22/2013 NA              139   11/22/2013 K               5.0   11/22/2013 CL              102   11/22/2013 CREATININE     0.73   11/22/2013 BUN              18   11/22/2013 CO2              29   11/22/2013 TSH           1.528   11/22/2013 HGBA1C          5.9   11/22/2013   Anxiety       Medication List       This list is accurate as of: 03/21/14 11:53 AM.  Always use your most recent med list.               CALCIUM 1200 PO  Take by mouth daily.     diazepam 2 MG tablet  Commonly known as:  VALIUM  Take 1 tablet (2 mg total) by mouth every 8 (eight) hours as needed for anxiety.     DULoxetine 60 MG capsule  Commonly known as:  CYMBALTA  Take 1 capsule (60 mg total) by mouth daily.     DULoxetine 30 MG capsule  Commonly known as:  CYMBALTA  Take 1 capsule (30 mg total) by mouth daily.     ferrous sulfate dried 160 (50 FE) MG Tbcr SR tablet  Commonly known as:  SLOW FE  Take 160 mg by mouth  daily.  Magnesium 500 MG Caps  Take 500 mg by mouth 2 (two) times daily.     multivitamin with minerals tablet  Take 1 tablet by mouth daily.     Vitamin D (Ergocalciferol) 50000 UNITS Caps capsule  Commonly known as:  DRISDOL  Take 50,000 Units by mouth daily.       Allergies  Allergen Reactions  . E-Mycin [Erythromycin]     GI upset   Past Medical History  Diagnosis Date  . Fibromyalgia   . H/O gastric bypass   . Mixed hyperlipidemia 11/25/2013  . Adjustment disorder with mixed anxiety and depressed mood 11/25/2013      Review of Systems  Constitutional: Positive for fatigue.  Musculoskeletal: Positive for arthralgias and myalgias.  Neurological: Positive for numbness.  All other systems reviewed and are negative.  BP 166/92  Pulse 86  Temp(Src) 98.4 F (36.9 C) (Temporal)  Resp 18  Ht 5' 3.75" (1.619 m)  Wt 256 lb (116.121 kg)  BMI 44.30 kg/m2 Re check 148/92    Objective:   Physical Exam  Nursing note and vitals reviewed. Constitutional: She is oriented to person, place, and time. She appears well-developed and well-nourished. No distress.  obese  HENT:  Head: Normocephalic and atraumatic.  Right Ear: External ear normal.  Left Ear: External ear normal.  Nose: Nose normal.  Mouth/Throat: Oropharynx is clear and moist.  Eyes: Conjunctivae and EOM are normal.  Neck: Normal range of motion. Neck supple. No JVD present. No thyromegaly present.  Cardiovascular: Normal rate, regular rhythm, normal heart sounds and intact distal pulses.   Pulmonary/Chest: Effort normal and breath sounds normal.  Abdominal: Soft. Bowel sounds are normal. She exhibits no distension and no mass. There is no tenderness. There is no rebound and no guarding.  Musculoskeletal: Normal range of motion. She exhibits no edema and no tenderness.  Lymphadenopathy:    She has no cervical adenopathy.  Neurological: She is alert and oriented to person, place, and time. She has normal  reflexes. No cranial nerve deficit. Coordination normal.  Skin: Skin is warm and dry. No rash noted. No erythema. No pallor.  Psychiatric: Her behavior is normal. Judgment and thought content normal.  tearful           Assessment & Plan:  1.  3 month F/U for HTN, Cholesterol, Pre-Dm, D. Deficient. Needs healthy diet, cardio QD and obtain healthy weight. Check Labs, Check BP if >130/80 call office Start Benicar 20 SX #14 ADD Lasix 40 1 qd AD  2. Fatigue vs anemia vs ? OSA with obesity vs depression- check labs, increase activity and H2O, get sleep study  3. ? Autoimmune vs Arthralgias/ myalgias- Use Vimovo20/500 BID sparingly. Advised water exercise 3 x week, declines referral to Rheum vs neuro

## 2014-03-21 NOTE — Patient Instructions (Signed)
Hypertension Hypertension is another name for high blood pressure. High blood pressure may mean that your heart needs to work harder to pump blood. Blood pressure consists of two numbers, which includes a higher number over a lower number (example: 110/72). HOME CARE   Make lifestyle changes as told by your doctor. This may include weight loss and exercise.  Take your blood pressure medicine every day.  Limit how much salt you use.  Stop smoking if you smoke.  Do not use drugs.  Talk to your doctor if you are using decongestants or birth control pills. These medicines might make blood pressure higher.  Females should not drink more than 1 alcoholic drink per day. Males should not drink more than 2 alcoholic drinks per day.  See your doctor as told. GET HELP RIGHT AWAY IF:   You have a blood pressure reading with a top number of 180 or higher.  You get a very bad headache.  You get blurred or changing vision.  You feel confused.  You feel weak, numb, or faint.  You get chest or belly (abdominal) pain.  You throw up (vomit).  You cannot breathe very well. MAKE SURE YOU:   Understand these instructions.  Will watch your condition.  Will get help right away if you are not doing well or get worse. Document Released: 03/22/2008 Document Revised: 12/27/2011 Document Reviewed: 03/22/2008 [employer] Patient Information 2014 [employer].  

## 2014-03-22 ENCOUNTER — Other Ambulatory Visit: Payer: Self-pay | Admitting: Emergency Medicine

## 2014-03-22 DIAGNOSIS — R5381 Other malaise: Secondary | ICD-10-CM

## 2014-03-22 DIAGNOSIS — R5383 Other fatigue: Principal | ICD-10-CM

## 2014-03-22 LAB — BASIC METABOLIC PANEL WITH GFR
BUN: 10 mg/dL (ref 6–23)
CO2: 26 meq/L (ref 19–32)
Calcium: 9.5 mg/dL (ref 8.4–10.5)
Chloride: 104 mEq/L (ref 96–112)
Creat: 0.62 mg/dL (ref 0.50–1.10)
GFR, Est Non African American: >89 mL/min
GLUCOSE: 83 mg/dL (ref 70–99)
Potassium: 4.7 mEq/L (ref 3.5–5.3)
SODIUM: 140 meq/L (ref 135–145)

## 2014-03-22 LAB — HEPATIC FUNCTION PANEL
ALBUMIN: 3.9 g/dL (ref 3.5–5.2)
ALT: 15 U/L (ref 0–35)
AST: 19 U/L (ref 0–37)
Alkaline Phosphatase: 70 U/L (ref 39–117)
BILIRUBIN TOTAL: 0.4 mg/dL (ref 0.2–1.2)
Bilirubin, Direct: 0.1 mg/dL (ref 0.0–0.3)
Indirect Bilirubin: 0.3 mg/dL (ref 0.2–1.2)
TOTAL PROTEIN: 6.7 g/dL (ref 6.0–8.3)

## 2014-03-22 LAB — LIPID PANEL
Cholesterol: 181 mg/dL (ref 0–200)
HDL: 62 mg/dL (ref >39)
LDL Cholesterol: 97 mg/dL (ref 0–99)
Total CHOL/HDL Ratio: 2.9 Ratio
Triglycerides: 110 mg/dL (ref <150)
VLDL: 22 mg/dL (ref 0–40)

## 2014-03-22 LAB — INSULIN, FASTING: INSULIN FASTING, SERUM: 10 u[IU]/mL (ref 3–28)

## 2014-03-22 LAB — VITAMIN D 25 HYDROXY (VIT D DEFICIENCY, FRACTURES): Vit D, 25-Hydroxy: 71 ng/mL (ref 30–89)

## 2014-03-22 LAB — TSH: TSH: 1.489 u[IU]/mL (ref 0.350–4.500)

## 2014-03-22 LAB — VITAMIN B12: VITAMIN B 12: 469 pg/mL (ref 211–911)

## 2014-03-26 ENCOUNTER — Encounter: Payer: Self-pay | Admitting: Emergency Medicine

## 2014-04-11 ENCOUNTER — Telehealth: Payer: Self-pay | Admitting: *Deleted

## 2014-04-11 NOTE — Telephone Encounter (Signed)
Pt is calling said she has been exposed to c-diff. Said she cleaned a womans house completely that had it & now has nausea,vomiting & diarrhea. Said she doesn't think she could drive here can you call in something?

## 2014-04-29 ENCOUNTER — Encounter: Payer: Self-pay | Admitting: Emergency Medicine

## 2014-05-28 MED ORDER — OLMESARTAN MEDOXOMIL 40 MG PO TABS
40.0000 mg | ORAL_TABLET | Freq: Every day | ORAL | Status: DC
Start: 2014-05-28 — End: 2015-04-14

## 2014-07-18 ENCOUNTER — Ambulatory Visit: Payer: Self-pay | Admitting: Physician Assistant

## 2014-08-22 ENCOUNTER — Ambulatory Visit: Payer: Self-pay | Admitting: Physician Assistant

## 2014-08-22 ENCOUNTER — Ambulatory Visit (INDEPENDENT_AMBULATORY_CARE_PROVIDER_SITE_OTHER): Payer: Managed Care, Other (non HMO) | Admitting: Emergency Medicine

## 2014-08-22 ENCOUNTER — Encounter: Payer: Self-pay | Admitting: Emergency Medicine

## 2014-08-22 VITALS — BP 142/90 | HR 102 | Temp 98.4°F | Resp 18 | Ht 63.75 in | Wt 252.0 lb

## 2014-08-22 DIAGNOSIS — R200 Anesthesia of skin: Secondary | ICD-10-CM

## 2014-08-22 DIAGNOSIS — R202 Paresthesia of skin: Secondary | ICD-10-CM

## 2014-08-22 DIAGNOSIS — D649 Anemia, unspecified: Secondary | ICD-10-CM

## 2014-08-22 DIAGNOSIS — E782 Mixed hyperlipidemia: Secondary | ICD-10-CM

## 2014-08-22 DIAGNOSIS — R739 Hyperglycemia, unspecified: Secondary | ICD-10-CM

## 2014-08-22 DIAGNOSIS — I10 Essential (primary) hypertension: Secondary | ICD-10-CM

## 2014-08-22 NOTE — Progress Notes (Signed)
Subjective:    Patient ID: Nicole Padilla, female    DOB: 1956-05-10, 58 y.o.   MRN: [id number]  HPI Comments: 58 yo WF presents for 3 month F/U for HTN, Cholesterol, Pre-Dm, D. Deficient. BP 130s/ 80s with 1/2 tablet at home. She has decreased dose due to some low BP and dizziness issues, which resolved with lower dose. She is up to 28 lbs with black cohosh for hotflashes but has lost 6 lbs since d/c supplement.  She had to increase Cymbalta to 60 mg QD and has had improvement with symptoms of depression/ pain. She has had flare ups occasional.   Left arm tingling x 1 month on/off without injury or CP. She notes left middle finger is numb x 1 month on/ off. She denies neck history but does move furniture often.   Lab Results      Component                Value               Date                      WBC                      6.5                 03/21/2014                HGB                      14.1                03/21/2014                HCT                      41.8                03/21/2014                PLT                      270                 03/21/2014                GLUCOSE                  83                  03/21/2014                CHOL                     181                 03/21/2014                TRIG                     110                 03/21/2014                HDL  62                  03/21/2014                LDLCALC                  97                  03/21/2014                ALT                      15                  03/21/2014                AST                      19                  03/21/2014                NA                       140                 03/21/2014                K                        4.7                 03/21/2014                CL                       104                 03/21/2014                CREATININE               0.62                03/21/2014                BUN                      10                   03/21/2014                CO2                      26                  03/21/2014                TSH                      1.489               03/21/2014                HGBA1C                   5.7*  03/21/2014             Hypertension Pertinent negatives include no chest pain, neck pain or shortness of breath.     Medication List       This list is accurate as of: 08/22/14  3:14 PM.  Always use your most recent med list.               CALCIUM 1200 PO  Take by mouth daily.     diazepam 2 MG tablet  Commonly known as:  VALIUM  Take 1 tablet (2 mg total) by mouth every 8 (eight) hours as needed for anxiety.     DULoxetine 60 MG capsule  Commonly known as:  CYMBALTA  Take 1 capsule (60 mg total) by mouth daily.     ferrous sulfate dried 160 (50 FE) MG Tbcr SR tablet  Commonly known as:  SLOW FE  Take 160 mg by mouth daily.     furosemide 40 MG tablet  Commonly known as:  LASIX  Take 1 tablet (40 mg total) by mouth daily.     Magnesium 500 MG Caps  Take 500 mg by mouth 2 (two) times daily.     multivitamin with minerals tablet  Take 1 tablet by mouth daily.     olmesartan 40 MG tablet  Commonly known as:  BENICAR  Take 1 tablet (40 mg total) by mouth daily.     Vitamin D (Ergocalciferol) 50000 UNITS Caps capsule  Commonly known as:  DRISDOL  Take 1 capsule (50,000 Units total) by mouth daily.       Allergies  Allergen Reactions  . E-Mycin [Erythromycin]     GI upset   Past Medical History  Diagnosis Date  . Fibromyalgia   . H/O gastric bypass   . Mixed hyperlipidemia 11/25/2013  . Adjustment disorder with mixed anxiety and depressed mood 11/25/2013     Review of Systems  Respiratory: Negative for shortness of breath.   Cardiovascular: Negative for chest pain.  Musculoskeletal: Negative for neck pain.  Neurological: Positive for numbness.  All other systems reviewed and are negative.  BP 142/90 mmHg  Pulse 102  Temp(Src) 98.4 F (36.9  C) (Temporal)  Resp 18  Ht 5' 3.75" (1.619 m)  Wt 252 lb (114.306 kg)  BMI 43.61 kg/m2     Objective:   Physical Exam  Constitutional: She is oriented to person, place, and time. She appears well-developed and well-nourished. No distress.  Obese  HENT:  Head: Normocephalic and atraumatic.  Right Ear: External ear normal.  Left Ear: External ear normal.  Nose: Nose normal.  Mouth/Throat: Oropharynx is clear and moist.  Eyes: Conjunctivae and EOM are normal.  Neck: Normal range of motion. Neck supple. No JVD present. No thyromegaly present.  Cardiovascular: Normal rate, regular rhythm, normal heart sounds and intact distal pulses.   Pulmonary/Chest: Effort normal and breath sounds normal.  Abdominal: Soft. Bowel sounds are normal. There is no tenderness. There is no rebound.  Musculoskeletal: Normal range of motion. She exhibits no edema or tenderness.  Lymphadenopathy:    She has no cervical adenopathy.  Neurological: She is alert and oriented to person, place, and time. A cranial nerve deficit is present.  Decreased sensation mild in left middle finger  Skin: Skin is warm and dry. No rash noted. No erythema. No pallor.  Psychiatric: She has a normal mood and affect. Her behavior is normal. Judgment and thought content normal.  Nursing note and vitals reviewed.  EKG NSCSPT WNL     Assessment & Plan:  1.  3 month F/U for HTN, Cholesterol, Pre-Dm, D. Deficient. Needs healthy diet, cardio QD and obtain healthy weight. Check Labs, Check BP if >130/80 call office   2. Left arm tingling/ numbness declines Xray cervical and left shoulder. ER if symptoms increase. Will check labs and try wave pillow, if no relief w/c for x-rays request

## 2014-08-22 NOTE — Patient Instructions (Signed)
  FYI Cervical Radiculopathy Cervical radiculopathy happens when a nerve in the neck is pinched or bruised by a slipped (herniated) disk or by arthritic changes in the bones of the cervical spine. This can occur due to an injury or as part of the normal aging process. Pressure on the cervical nerves can cause pain or numbness that runs from your neck all the way down into your arm and fingers. CAUSES  There are many possible causes, including:  Injury.  Muscle tightness in the neck from overuse.  Swollen, painful joints (arthritis).  Breakdown or degeneration in the bones and joints of the spine (spondylosis) due to aging.  Bone spurs that may develop near the cervical nerves. SYMPTOMS  Symptoms include pain, weakness, or numbness in the affected arm and hand. Pain can be severe or irritating. Symptoms may be worse when extending or turning the neck. DIAGNOSIS  Your caregiver will ask about your symptoms and do a physical exam. He or she may test your strength and reflexes. X-rays, CT scans, and MRI scans may be needed in cases of injury or if the symptoms do not go away after a period of time. Electromyography (EMG) or nerve conduction testing may be done to study how your nerves and muscles are working. TREATMENT  Your caregiver may recommend certain exercises to help relieve your symptoms. Cervical radiculopathy can, and often does, get better with time and treatment. If your problems continue, treatment options may include:  Wearing a soft collar for short periods of time.  Physical therapy to strengthen the neck muscles.  Medicines, such as nonsteroidal anti-inflammatory drugs (NSAIDs), oral corticosteroids, or spinal injections.  Surgery. Different types of surgery may be done depending on the cause of your problems. HOME CARE INSTRUCTIONS   Put ice on the affected area.  Put ice in a plastic bag.  Place a towel between your skin and the bag.  Leave the ice on for 15-20  minutes, 03-04 times a day or as directed by your caregiver.  If ice does not help, you can try using heat. Take a warm shower or bath, or use a hot water bottle as directed by your caregiver.  You may try a gentle neck and shoulder massage.  Use a flat pillow when you sleep.  Only take over-the-counter or prescription medicines for pain, discomfort, or fever as directed by your caregiver.  If physical therapy was prescribed, follow your caregiver's directions.  If a soft collar was prescribed, use it as directed. SEEK IMMEDIATE MEDICAL CARE IF:   Your pain gets much worse and cannot be controlled with medicines.  You have weakness or numbness in your hand, arm, face, or leg.  You have a high fever or a stiff, rigid neck.  You lose bowel or bladder control (incontinence).  You have trouble with walking, balance, or speaking. MAKE SURE YOU:   Understand these instructions.  Will watch your condition.  Will get help right away if you are not doing well or get worse. Document Released: 06/29/2001 Document Revised: 12/27/2011 Document Reviewed: 05/18/2011 [facility] Patient Information 2015 [place], [place]. This information is not intended to replace advice given to you by your health care provider. Make sure you discuss any questions you have with your health care provider.

## 2014-08-23 LAB — LIPID PANEL
CHOL/HDL RATIO: 2.9 ratio
CHOLESTEROL: 196 mg/dL (ref 0–200)
HDL: 68 mg/dL (ref >39)
LDL CALC: 108 mg/dL — AB (ref 0–99)
Triglycerides: 101 mg/dL (ref <150)
VLDL: 20 mg/dL (ref 0–40)

## 2014-08-23 LAB — HEPATIC FUNCTION PANEL
ALK PHOS: 77 U/L (ref 39–117)
ALT: 12 U/L (ref 0–35)
AST: 17 U/L (ref 0–37)
Albumin: 4.2 g/dL (ref 3.5–5.2)
Total Bilirubin: 0.2 mg/dL (ref 0.2–1.2)
Total Protein: 6.8 g/dL (ref 6.0–8.3)

## 2014-08-23 LAB — CBC WITH DIFFERENTIAL/PLATELET
Basophils Absolute: 0 10*3/uL (ref 0.0–0.1)
Basophils Relative: 0 % (ref 0–1)
Eosinophils Absolute: 0 10*3/uL (ref 0.0–0.7)
Eosinophils Relative: 0 % (ref 0–5)
HCT: 45 % (ref 36.0–46.0)
HEMOGLOBIN: 15 g/dL (ref 12.0–15.0)
Lymphocytes Relative: 28 % (ref 12–46)
Lymphs Abs: 2.7 10*3/uL (ref 0.7–4.0)
MCH: 28.5 pg (ref 26.0–34.0)
MCHC: 33.3 g/dL (ref 30.0–36.0)
MCV: 85.4 fL (ref 78.0–100.0)
MONOS PCT: 4 % (ref 3–12)
Monocytes Absolute: 0.4 10*3/uL (ref 0.1–1.0)
Neutro Abs: 6.6 10*3/uL (ref 1.7–7.7)
Neutrophils Relative %: 68 % (ref 43–77)
PLATELETS: 295 10*3/uL (ref 150–400)
RBC: 5.27 MIL/uL — ABNORMAL HIGH (ref 3.87–5.11)
RDW: 13.8 % (ref 11.5–15.5)
WBC: 9.7 10*3/uL (ref 4.0–10.5)

## 2014-08-23 LAB — VITAMIN B12: VITAMIN B 12: 449 pg/mL (ref 211–911)

## 2014-08-23 LAB — BASIC METABOLIC PANEL WITH GFR
BUN: 15 mg/dL (ref 6–23)
CO2: 26 mEq/L (ref 19–32)
Calcium: 9.5 mg/dL (ref 8.4–10.5)
Chloride: 97 mEq/L (ref 96–112)
Creat: 0.96 mg/dL (ref 0.50–1.10)
GFR, EST AFRICAN AMERICAN: 75 mL/min
GFR, EST NON AFRICAN AMERICAN: 65 mL/min
Glucose, Bld: 95 mg/dL (ref 70–99)
POTASSIUM: 4.3 meq/L (ref 3.5–5.3)
SODIUM: 137 meq/L (ref 135–145)

## 2014-08-23 LAB — HEMOGLOBIN A1C
HEMOGLOBIN A1C: 5.9 % — AB (ref <5.7)
MEAN PLASMA GLUCOSE: 123 mg/dL — AB (ref <117)

## 2014-08-23 LAB — INSULIN, FASTING: Insulin fasting, serum: 8.3 u[IU]/mL (ref 2.0–19.6)

## 2014-09-05 ENCOUNTER — Encounter: Payer: Self-pay | Admitting: *Deleted

## 2014-12-31 ENCOUNTER — Ambulatory Visit: Payer: Self-pay | Admitting: Physician Assistant

## 2014-12-31 ENCOUNTER — Ambulatory Visit (INDEPENDENT_AMBULATORY_CARE_PROVIDER_SITE_OTHER): Payer: Managed Care, Other (non HMO) | Admitting: Internal Medicine

## 2014-12-31 ENCOUNTER — Encounter: Payer: Self-pay | Admitting: Internal Medicine

## 2014-12-31 VITALS — BP 136/82 | HR 92 | Temp 98.0°F | Resp 18 | Ht 63.75 in | Wt 262.0 lb

## 2014-12-31 DIAGNOSIS — R7309 Other abnormal glucose: Secondary | ICD-10-CM | POA: Insufficient documentation

## 2014-12-31 DIAGNOSIS — Z79899 Other long term (current) drug therapy: Secondary | ICD-10-CM | POA: Insufficient documentation

## 2014-12-31 DIAGNOSIS — R7303 Prediabetes: Secondary | ICD-10-CM

## 2014-12-31 DIAGNOSIS — I10 Essential (primary) hypertension: Secondary | ICD-10-CM | POA: Insufficient documentation

## 2014-12-31 DIAGNOSIS — E559 Vitamin D deficiency, unspecified: Secondary | ICD-10-CM | POA: Insufficient documentation

## 2014-12-31 DIAGNOSIS — E782 Mixed hyperlipidemia: Secondary | ICD-10-CM

## 2014-12-31 LAB — CBC WITH DIFFERENTIAL/PLATELET
BASOS ABS: 0 10*3/uL (ref 0.0–0.1)
BASOS PCT: 0 % (ref 0–1)
Eosinophils Absolute: 0.1 10*3/uL (ref 0.0–0.7)
Eosinophils Relative: 1 % (ref 0–5)
HCT: 42 % (ref 36.0–46.0)
Hemoglobin: 13.8 g/dL (ref 12.0–15.0)
Lymphocytes Relative: 25 % (ref 12–46)
Lymphs Abs: 1.8 10*3/uL (ref 0.7–4.0)
MCH: 27.5 pg (ref 26.0–34.0)
MCHC: 32.9 g/dL (ref 30.0–36.0)
MCV: 83.7 fL (ref 78.0–100.0)
MPV: 10.8 fL (ref 8.6–12.4)
Monocytes Absolute: 0.5 10*3/uL (ref 0.1–1.0)
Monocytes Relative: 7 % (ref 3–12)
NEUTROS ABS: 4.8 10*3/uL (ref 1.7–7.7)
NEUTROS PCT: 67 % (ref 43–77)
PLATELETS: 276 10*3/uL (ref 150–400)
RBC: 5.02 MIL/uL (ref 3.87–5.11)
RDW: 14.1 % (ref 11.5–15.5)
WBC: 7.1 10*3/uL (ref 4.0–10.5)

## 2014-12-31 NOTE — Patient Instructions (Signed)

## 2014-12-31 NOTE — Progress Notes (Signed)
Patient ID: Nicole Padilla, female   DOB: February 10, 1956, 59 y.o.   MRN: [id number]  Assessment and Plan:  Hypertension:  -Continue medication,  -monitor blood pressure at home.  -Continue DASH diet.   -Reminder to go to the ER if any CP, SOB, nausea, dizziness, severe HA, changes vision/speech, left arm numbness and tingling, and jaw pain.  Cholesterol: -Continue diet and exercise.  -Check cholesterol.   Pre-diabetes: -Continue diet and exercise.  -Check A1C  Vitamin D Def: -check level -continue medications.   Continue diet and meds as discussed. Further disposition pending results of labs.  Patient currently off all medications except cymbalta.  Will check labs and adjust medications as needed.   HPI 59 y.o. female  presents for 3 month follow up with hypertension, hyperlipidemia, prediabetes and vitamin D.   Her blood pressure has not been controlled at home, today their BP is BP: 136/82 mmHg.  She reports that it is generally around 140/90 to 140/85.  She stopped taking her Benicar.  She does not workout.  She has an active job.  She does not do any structured activity.  She plans on going to the aquatic center to do swimming and water aerobics this week.  She denies chest pain, shortness of breath, dizziness.   She is not on cholesterol medication and denies myalgias. Her cholesterol is not at goal. The cholesterol last visit was:   Lab Results  Component Value Date   CHOL 196 08/22/2014   HDL 68 08/22/2014   LDLCALC 108* 08/22/2014   TRIG 101 08/22/2014   CHOLHDL 2.9 08/22/2014     She has not been working on diet and exercise for prediabetes, and denies foot ulcerations, hyperglycemia, hypoglycemia , increased appetite, nausea, paresthesia of the feet, polydipsia, polyuria, visual disturbances, vomiting and weight loss. Last A1C in the office was:  Lab Results  Component Value Date   HGBA1C 5.9* 08/22/2014    Patient is on Vitamin D supplement.  Lab Results   Component Value Date   VD25OH 73 03/21/2014     Patient has a history of gastric bypass with complications.    Current Medications:  Current Outpatient Prescriptions on File Prior to Visit  Medication Sig Dispense Refill  . DULoxetine (CYMBALTA) 60 MG capsule Take 1 capsule (60 mg total) by mouth daily. 90 capsule 1  . Calcium Carbonate-Vit D-Min (CALCIUM 1200 PO) Take by mouth daily.    . diazepam (VALIUM) 2 MG tablet Take 1 tablet (2 mg total) by mouth every 8 (eight) hours as needed for anxiety. (Patient not taking: Reported on 12/31/2014) 90 tablet 0  . ferrous sulfate dried (SLOW FE) 160 (50 FE) MG TBCR SR tablet Take 160 mg by mouth daily.    . furosemide (LASIX) 40 MG tablet Take 1 tablet (40 mg total) by mouth daily. (Patient not taking: Reported on 12/31/2014) 30 tablet 11  . Magnesium 500 MG CAPS Take 500 mg by mouth 2 (two) times daily.    . Multiple Vitamins-Minerals (MULTIVITAMIN WITH MINERALS) tablet Take 1 tablet by mouth daily.    Marland Kitchen olmesartan (BENICAR) 40 MG tablet Take 1 tablet (40 mg total) by mouth daily. (Patient not taking: Reported on 12/31/2014) 30 tablet 4  . Vitamin D, Ergocalciferol, (DRISDOL) 50000 UNITS CAPS capsule Take 1 capsule (50,000 Units total) by mouth daily. (Patient not taking: Reported on 12/31/2014) 30 capsule 6   No current facility-administered medications on file prior to visit.    Medical History:  Past Medical  History  Diagnosis Date  . Fibromyalgia   . H/O gastric bypass   . Mixed hyperlipidemia 11/25/2013  . Adjustment disorder with mixed anxiety and depressed mood 11/25/2013    Allergies:  Allergies  Allergen Reactions  . E-Mycin [Erythromycin]     GI upset     Review of Systems:  Review of Systems  Constitutional: Negative for fever, chills and malaise/fatigue.  HENT: Negative for congestion, ear discharge, ear pain, sore throat and tinnitus.   Eyes: Negative.   Respiratory: Negative for cough, sputum production, shortness of  breath and wheezing.   Cardiovascular: Negative for chest pain, palpitations, orthopnea and leg swelling.  Gastrointestinal: Negative for nausea, vomiting, abdominal pain, diarrhea, constipation, blood in stool and melena.  Genitourinary: Negative.   Musculoskeletal: Positive for myalgias.  Neurological: Negative for weakness and headaches.  Psychiatric/Behavioral: Positive for depression. The patient is nervous/anxious.     Family history- Review and unchanged  Social history- Review and unchanged  Physical Exam: BP 136/82 mmHg  Pulse 92  Temp(Src) 98 F (36.7 C) (Temporal)  Resp 18  Ht 5' 3.75" (1.619 m)  Wt 262 lb (118.842 kg)  BMI 45.34 kg/m2 Wt Readings from Last 3 Encounters:  12/31/14 262 lb (118.842 kg)  08/22/14 252 lb (114.306 kg)  03/21/14 256 lb (116.121 kg)    General Appearance: Well nourished well developed, in no apparent distress. Eyes: PERRLA, EOMs, conjunctiva no swelling or erythema ENT/Mouth: Ear canals normal without obstruction, swelling, erythma, discharge.  TMs normal bilaterally.  Oropharynx moist, clear, without exudate, or postoropharyngeal swelling. Neck: Supple, thyroid normal,no cervical adenopathy  Respiratory: Respiratory effort normal, Breath sounds clear A&P without rhonchi, wheeze, or rale.  No retractions, no accessory usage. Cardio: RRR with no MRGs. Brisk peripheral pulses without edema.  Abdomen: Soft, + BS,  Non tender, no guarding, rebound, hernias, masses. Musculoskeletal: Full ROM, 5/5 strength, Normal gait Skin: Warm, dry without rashes, lesions, ecchymosis.  Neuro: Awake and oriented X 3, Cranial nerves intact. Normal muscle tone, no cerebellar symptoms. Psych: Normal affect, Insight and Judgment appropriate.    FORCUCCI, Eleasha Cataldo, PA-C 10:12 AM [facility]

## 2015-01-01 LAB — BASIC METABOLIC PANEL WITH GFR
BUN: 17 mg/dL (ref 6–23)
CHLORIDE: 105 meq/L (ref 96–112)
CO2: 25 meq/L (ref 19–32)
Calcium: 9 mg/dL (ref 8.4–10.5)
Creat: 0.6 mg/dL (ref 0.50–1.10)
GFR, Est African American: >89 mL/min
Glucose, Bld: 82 mg/dL (ref 70–99)
POTASSIUM: 4.3 meq/L (ref 3.5–5.3)
Sodium: 139 mEq/L (ref 135–145)

## 2015-01-01 LAB — HEPATIC FUNCTION PANEL
ALK PHOS: 64 U/L (ref 39–117)
ALT: 9 U/L (ref 0–35)
AST: 14 U/L (ref 0–37)
Albumin: 4 g/dL (ref 3.5–5.2)
BILIRUBIN DIRECT: 0.1 mg/dL (ref 0.0–0.3)
Indirect Bilirubin: 0.2 mg/dL (ref 0.2–1.2)
TOTAL PROTEIN: 6.7 g/dL (ref 6.0–8.3)
Total Bilirubin: 0.3 mg/dL (ref 0.2–1.2)

## 2015-01-01 LAB — VITAMIN D 25 HYDROXY (VIT D DEFICIENCY, FRACTURES): Vit D, 25-Hydroxy: 26 ng/mL — ABNORMAL LOW (ref 30–100)

## 2015-01-01 LAB — HEMOGLOBIN A1C
Hgb A1c MFr Bld: 6 % — ABNORMAL HIGH (ref <5.7)
MEAN PLASMA GLUCOSE: 126 mg/dL — AB (ref <117)

## 2015-01-01 LAB — LIPID PANEL
CHOL/HDL RATIO: 2.7 ratio
Cholesterol: 180 mg/dL (ref 0–200)
HDL: 67 mg/dL (ref >=46)
LDL CALC: 97 mg/dL (ref 0–99)
TRIGLYCERIDES: 82 mg/dL (ref <150)
VLDL: 16 mg/dL (ref 0–40)

## 2015-01-01 LAB — INSULIN, FASTING: INSULIN FASTING, SERUM: 8.7 u[IU]/mL (ref 2.0–19.6)

## 2015-01-01 LAB — MAGNESIUM: Magnesium: 2 mg/dL (ref 1.5–2.5)

## 2015-01-01 LAB — TSH: TSH: 1.693 u[IU]/mL (ref 0.350–4.500)

## 2015-01-05 ENCOUNTER — Encounter: Payer: Self-pay | Admitting: *Deleted

## 2015-01-16 ENCOUNTER — Other Ambulatory Visit: Payer: Self-pay

## 2015-01-16 ENCOUNTER — Encounter: Payer: Self-pay | Admitting: Internal Medicine

## 2015-01-16 MED ORDER — DULOXETINE HCL 60 MG PO CPEP: 60.0000 mg | ORAL_CAPSULE | Freq: Every day | ORAL | Status: DC

## 2015-04-04 ENCOUNTER — Ambulatory Visit: Payer: Self-pay | Admitting: Internal Medicine

## 2015-04-14 ENCOUNTER — Ambulatory Visit (INDEPENDENT_AMBULATORY_CARE_PROVIDER_SITE_OTHER): Payer: Managed Care, Other (non HMO) | Admitting: Internal Medicine

## 2015-04-14 ENCOUNTER — Encounter: Payer: Self-pay | Admitting: Internal Medicine

## 2015-04-14 VITALS — BP 158/88 | HR 90 | Temp 98.0°F | Resp 18 | Ht 63.75 in | Wt 252.0 lb

## 2015-04-14 DIAGNOSIS — R300 Dysuria: Secondary | ICD-10-CM

## 2015-04-14 DIAGNOSIS — E559 Vitamin D deficiency, unspecified: Secondary | ICD-10-CM

## 2015-04-14 DIAGNOSIS — R7303 Prediabetes: Secondary | ICD-10-CM

## 2015-04-14 DIAGNOSIS — F4323 Adjustment disorder with mixed anxiety and depressed mood: Secondary | ICD-10-CM

## 2015-04-14 DIAGNOSIS — I10 Essential (primary) hypertension: Secondary | ICD-10-CM

## 2015-04-14 DIAGNOSIS — Z79899 Other long term (current) drug therapy: Secondary | ICD-10-CM

## 2015-04-14 DIAGNOSIS — E782 Mixed hyperlipidemia: Secondary | ICD-10-CM

## 2015-04-14 DIAGNOSIS — R7309 Other abnormal glucose: Secondary | ICD-10-CM

## 2015-04-14 LAB — BASIC METABOLIC PANEL WITH GFR
BUN: 21 mg/dL (ref 6–23)
CHLORIDE: 105 meq/L (ref 96–112)
CO2: 28 mEq/L (ref 19–32)
CREATININE: 0.95 mg/dL (ref 0.50–1.10)
Calcium: 9.7 mg/dL (ref 8.4–10.5)
GFR, Est African American: 76 mL/min
GFR, Est Non African American: 66 mL/min
Glucose, Bld: 96 mg/dL (ref 70–99)
Potassium: 4.1 mEq/L (ref 3.5–5.3)
Sodium: 142 mEq/L (ref 135–145)

## 2015-04-14 LAB — CBC WITH DIFFERENTIAL/PLATELET
BASOS ABS: 0 10*3/uL (ref 0.0–0.1)
BASOS PCT: 0 % (ref 0–1)
EOS ABS: 0.1 10*3/uL (ref 0.0–0.7)
EOS PCT: 1 % (ref 0–5)
HCT: 42.1 % (ref 36.0–46.0)
HEMOGLOBIN: 13.6 g/dL (ref 12.0–15.0)
Lymphocytes Relative: 23 % (ref 12–46)
Lymphs Abs: 2 10*3/uL (ref 0.7–4.0)
MCH: 27.5 pg (ref 26.0–34.0)
MCHC: 32.3 g/dL (ref 30.0–36.0)
MCV: 85.1 fL (ref 78.0–100.0)
MONO ABS: 0.4 10*3/uL (ref 0.1–1.0)
MPV: 11.2 fL (ref 8.6–12.4)
Monocytes Relative: 5 % (ref 3–12)
Neutro Abs: 6 10*3/uL (ref 1.7–7.7)
Neutrophils Relative %: 71 % (ref 43–77)
Platelets: 255 10*3/uL (ref 150–400)
RBC: 4.95 MIL/uL (ref 3.87–5.11)
RDW: 14.9 % (ref 11.5–15.5)
WBC: 8.5 10*3/uL (ref 4.0–10.5)

## 2015-04-14 LAB — MAGNESIUM: Magnesium: 1.9 mg/dL (ref 1.5–2.5)

## 2015-04-14 LAB — LIPID PANEL
Cholesterol: 168 mg/dL (ref 0–200)
HDL: 61 mg/dL (ref >=46)
LDL CALC: 87 mg/dL (ref 0–99)
Total CHOL/HDL Ratio: 2.8 Ratio
Triglycerides: 99 mg/dL (ref <150)
VLDL: 20 mg/dL (ref 0–40)

## 2015-04-14 LAB — HEPATIC FUNCTION PANEL
ALBUMIN: 4 g/dL (ref 3.5–5.2)
ALT: 14 U/L (ref 0–35)
AST: 19 U/L (ref 0–37)
Alkaline Phosphatase: 62 U/L (ref 39–117)
BILIRUBIN TOTAL: 0.5 mg/dL (ref 0.2–1.2)
Bilirubin, Direct: 0.1 mg/dL (ref 0.0–0.3)
Indirect Bilirubin: 0.4 mg/dL (ref 0.2–1.2)
Total Protein: 6.7 g/dL (ref 6.0–8.3)

## 2015-04-14 LAB — TSH: TSH: 0.829 u[IU]/mL (ref 0.350–4.500)

## 2015-04-14 MED ORDER — NITROFURANTOIN MONOHYD MACRO 100 MG PO CAPS: 100.0000 mg | ORAL_CAPSULE | Freq: Two times a day (BID) | ORAL | Status: DC

## 2015-04-14 NOTE — Progress Notes (Signed)
Patient ID: JULL HARRAL, female   DOB: [birth year], 59 y.o.   MRN: [id number]  Assessment and Plan:  Hypertension:  -Discontinue medication, but monitor at home and if you cont to have elevation than call to the office.  -monitor blood pressure at home.  -Continue DASH diet.   -Reminder to go to the ER if any CP, SOB, nausea, dizziness, severe HA, changes vision/speech, left arm numbness and tingling, and jaw pain.  Cholesterol: -Continue diet and exercise.  -Check cholesterol.   Pre-diabetes: -Continue diet and exercise.  -Check A1C  Vitamin D Def: -check level -continue medications.   Continue diet and meds as discussed. Further disposition pending results of labs.  HPI 59 y.o. female  presents for 3 month follow up with hypertension, hyperlipidemia, prediabetes and vitamin D.   Her blood pressure has been controlled at home, today their BP is BP: (!) 158/88 mmHg.   She does workout.  She reports that she is doing a lot of walking.  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  She is doing a walking tape several times a week.   She denies chest pain, shortness of breath, dizziness.   She is not on cholesterol medication and denies myalgias. Her cholesterol is at goal. The cholesterol last visit was:   Lab Results  Component Value Date   CHOL 180 12/31/2014   HDL 67 12/31/2014   LDLCALC 97 12/31/2014   TRIG 82 12/31/2014   CHOLHDL 2.7 12/31/2014     She has been working on diet and exercise for prediabetes, and denies foot ulcerations, hyperglycemia, hypoglycemia , increased appetite, nausea, paresthesia of the feet, polydipsia, polyuria, visual disturbances, vomiting and weight loss. Last A1C in the office was:  Lab Results  Component Value Date   HGBA1C 6.0* 12/31/2014    Patient is on Vitamin D supplement.  Lab Results  Component Value Date   VD25OH 26* 12/31/2014      Current Medications:  Current  Outpatient Prescriptions on File Prior to Visit  Medication Sig Dispense Refill  . Calcium Carbonate-Vit D-Min (CALCIUM 1200 PO) Take by mouth daily.    . DULoxetine (CYMBALTA) 60 MG capsule Take 1 capsule (60 mg total) by mouth daily. 90 capsule 1  . Multiple Vitamins-Minerals (MULTIVITAMIN WITH MINERALS) tablet Take 1 tablet by mouth daily.     No current facility-administered medications on file prior to visit.    Medical History:  Past Medical History  Diagnosis Date  . Fibromyalgia   . H/O gastric bypass   . Mixed hyperlipidemia 11/25/2013  . Adjustment disorder with mixed anxiety and depressed mood 11/25/2013    Allergies:  Allergies  Allergen Reactions  . E-Mycin [Erythromycin]     GI upset     Review of Systems:  Review of Systems  Constitutional: Negative for fever, chills and malaise/fatigue.  HENT: Negative for congestion, ear pain and sore throat.   Eyes: Negative.   Respiratory: Negative for cough, shortness of breath and wheezing.   Cardiovascular: Negative for chest pain, palpitations and leg swelling.  Gastrointestinal: Negative for diarrhea, constipation, blood in stool and melena.  Genitourinary: Positive for dysuria, urgency and flank pain. Negative for frequency.  Skin: Negative.   Neurological: Negative for dizziness, sensory change, loss of consciousness and headaches.  Psychiatric/Behavioral: Negative for depression. The patient is not nervous/anxious and does not have insomnia.     Family history- Review and unchanged  Social history- Review and unchanged  Physical Exam: BP 158/88 mmHg  Pulse 90  Temp(Src) 98 F (36.7 C) (Temporal)  Resp 18  Ht 5' 3.75" (1.619 m)  Wt 252 lb (114.306 kg)  BMI 43.61 kg/m2 Wt Readings from Last 3 Encounters:  04/14/15 252 lb (114.306 kg)  12/31/14 262 lb (118.842 kg)  08/22/14 252 lb (114.306 kg)    General Appearance: Well nourished well developed, in no apparent distress. Eyes: PERRLA, EOMs, conjunctiva no  swelling or erythema ENT/Mouth: Ear canals normal without obstruction, swelling, erythma, discharge.  TMs normal bilaterally.  Oropharynx moist, clear, without exudate, or postoropharyngeal swelling. Neck: Supple, thyroid normal,no cervical adenopathy  Respiratory: Respiratory effort normal, Breath sounds clear A&P without rhonchi, wheeze, or rale.  No retractions, no accessory usage. Cardio: RRR with no MRGs. Brisk peripheral pulses without edema.  Abdomen: Soft, + BS,  Non tender, no guarding, rebound, hernias, masses. Musculoskeletal: Full ROM, 5/5 strength, Normal gait Skin: Warm, dry without rashes, lesions, ecchymosis.  Neuro: Awake and oriented X 3, Cranial nerves intact. Normal muscle tone, no cerebellar symptoms. Psych: Normal affect, Insight and Judgment appropriate.    FORCUCCI, Clemmie Buelna, PA-C 4:01 PM [facility]

## 2015-04-14 NOTE — Patient Instructions (Signed)
Ways to cut 100 calories  1. Eat your eggs with hot sauce OR salsa instead of cheese.  Eggs are great for breakfast, but many people consider eggs and cheese to be BFFs. Instead of cheese-1 oz. of cheddar has 114 calories-top your eggs with hot sauce, which contains no calories and helps with satiety and metabolism. Salsa is also a great option!!  2. Top your toast, waffles or pancakes with mashed berries instead of jelly or syrup. Half a cup of berries-fresh, frozen or thawed-has about 40 calories, compared with 2 tbsp. of maple syrup or jelly, which both have about 100 calories. The berries will also give you a good punch of fiber, which helps keep you full and satisfied and won't spike blood sugar quickly like the jelly or syrup. 3. Swap the non-fat latte for black coffee with a splash of half-and-half. Contrary to its name, that non-fat latte has 130 calories and a startling 19g of carbohydrates per 16 oz. serving. Replacing that 'light' drinkable dessert with a black coffee with a splash of half-and-half saves you more than 100 calories per 16 oz. serving. 4. Sprinkle salads with freeze-dried raspberries instead of dried cranberries. If you want a sweet addition to your nutritious salad, stay away from dried cranberries. They have a whopping 130 calories per  cup and 30g carbohydrates. Instead, sprinkle freeze-dried raspberries guilt-free and save more than 100 calories per  cup serving, adding 3g of belly-filling fiber. 5. Go for mustard in place of mayo on your sandwich. Mustard can add really nice flavor to any sandwich, and there are tons of varieties, from spicy to honey. A serving of mayo is 95 calories, versus 10 calories in a serving of mustard. 6. Choose a DIY salad dressing instead of the store-bought kind. Mix Dijon or whole grain mustard with low-fat Kefir or red wine vinegar and garlic. 7. Use hummus as a spread instead of a dip. Use hummus as a spread on a high-fiber cracker or  tortilla with a sandwich and save on calories without sacrificing taste. 8. Pick just one salad "accessory." Salad isn't automatically a calorie winner. It's easy to over-accessorize with toppings. Instead of topping your salad with nuts, avocado and cranberries (all three will clock in at 313 calories), just pick one. The next day, choose a different accessory, which will also keep your salad interesting. You don't wear all your jewelry every day, right? 9. Ditch the white pasta in favor of spaghetti squash. One cup of cooked spaghetti squash has about 40 calories, compared with traditional spaghetti, which comes with more than 200. Spaghetti squash is also nutrient-dense. It's a good source of fiber and Vitamins A and C, and it can be eaten just like you would eat pasta-with a great tomato sauce and [place] meatballs or with pesto, tofu and spinach, for example. 10. Dress up your chili, soups and stews with non-fat [place] yogurt instead of sour cream. Just a 'dollop' of sour cream can set you back 115 calories and a whopping 12g of fat-seven of which are of the artery-clogging variety. Added bonus: [place] yogurt is packed with muscle-building protein, calcium and B Vitamins. 11. Mash cauliflower instead of mashed potatoes. One cup of traditional mashed potatoes-in all their creamy goodness-has more than 200 calories, compared to mashed cauliflower, which you can typically eat for less than 100 calories per 1 cup serving. Cauliflower is a great source of the antioxidant indole-3-carbinol (I3C), which may help reduce the risk of some cancers, like breast  cancer. 12. Ditch the ice cream sundae in favor of a Greek yogurt parfait. Instead of a cup of ice cream or fro-yo for dessert, try 1 cup of nonfat Greek yogurt topped with fresh berries and a sprinkle of cacao nibs. Both toppings are packed with antioxidants, which can help reduce cellular inflammation and oxidative damage. And the comparison is a  no-brainer: One cup of ice cream has about 275 calories; one cup of frozen yogurt has about 230; and a cup of Greek yogurt has just 130, plus twice the protein, so you're less likely to return to the freezer for a second helping. 13. Put olive oil in a spray container instead of using it directly from the bottle. Each tablespoon of olive oil is 120 calories and 15g of fat. Use a mister instead of pouring it straight into the pan or onto a salad. This allows for portion control and will save you more than 100 calories. 14. When baking, substitute canned pumpkin for butter or oil. Canned pumpkin-not pumpkin pie mix-is loaded with Vitamin A, which is important for skin and eye health, as well as immunity. And the comparisons are pretty crazy:  cup of canned pumpkin has about 40 calories, compared to butter or oil, which has more than 800 calories. Yes, 800 calories. Applesauce and mashed banana can also serve as good substitutions for butter or oil, usually in a 1:1 ratio. 15. Top casseroles with high-fiber cereal instead of breadcrumbs. Breadcrumbs are typically made with white bread, while breakfast cereals contain 5-9g of fiber per serving. Not only will you save more than 150 calories per  cup serving, the swap will also keep you more full and you'll get a metabolism boost from the added fiber. 16. Snack on pistachios instead of macadamia nuts. Believe it or not, you get the same amount of calories from 35 pistachios (100 calories) as you would from only five macadamia nuts. 17. Chow down on kale chips rather than potato chips. This is my favorite 'don't knock it 'till you try it' swap. Kale chips are so easy to make at home, and you can spice them up with a little grated parmesan or chili powder. Plus, they're a mere fraction of the calories of potato chips, but with the same crunch factor we crave so often. 18. Add seltzer and some fruit slices to your cocktail instead of soda or fruit juice. One  cup of soda or fruit juice can pack on as much as 140 calories. Instead, use seltzer and fruit slices. The fruit provides valuable phytochemicals, such as flavonoids and anthocyanins, which help to combat cancer and stave off the aging process.  We want weight loss that will last so you should lose 1-2 pounds a week.  THAT IS IT! Please pick THREE things a month to change. Once it is a habit check off the item. Then pick another three items off the list to become habits.  If you are already doing a habit on the list GREAT!  Cross that item off! o Don't drink your calories. Ie, alcohol, soda, fruit juice, and sweet tea.  o Drink more water. Drink a glass when you feel hungry or before each meal.  o Eat breakfast - Complex carb and protein (likeDannon light and fit yogurt, oatmeal, fruit, eggs, turkey bacon). o Measure your cereal.  Eat no more than one cup a day. (ie Kashi) o Eat an apple a day. o Add a vegetable a day. o Try a new vegetable   a month. o Use Pam! Stop using oil or butter to cook. o Don't finish your plate or use smaller plates. o Share your dessert. o Eat sugar free Jello for dessert or frozen grapes. o Don't eat 2-3 hours before bed. o Switch to whole wheat bread, pasta, and brown rice. o Make healthier choices when you eat out. No fries! o Pick baked chicken, NOT fried. o Don't forget to SLOW DOWN when you eat. It is not going anywhere.  o Take the stairs. o Park far away in the parking lot o Lift soup cans (or weights) for 10 minutes while watching TV. o Walk at work for 10 minutes during break. o Walk outside 1 time a week with your friend, kids, dog, or significant other. o Start a walking group at church. o Walk the mall as much as you can tolerate.  o Keep a food diary. o Weigh yourself daily. o Walk for 15 minutes 3 days per week. o Cook at home more often and eat out less.  If life happens and you go back to old habits, it is okay.  Just start over. You can do  it!   If you experience chest pain, get short of breath, or tired during the exercise, please stop immediately and inform your doctor.  

## 2015-04-15 LAB — URINALYSIS, ROUTINE W REFLEX MICROSCOPIC
GLUCOSE, UA: NEGATIVE mg/dL
Hgb urine dipstick: NEGATIVE
Ketones, ur: 15 mg/dL — AB
NITRITE: NEGATIVE
Protein, ur: NEGATIVE mg/dL
Specific Gravity, Urine: >1.03 — ABNORMAL HIGH (ref 1.005–1.030)
Urobilinogen, UA: 0.2 mg/dL (ref 0.0–1.0)
pH: 5 (ref 5.0–8.0)

## 2015-04-15 LAB — URINE CULTURE
Colony Count: NO GROWTH
ORGANISM ID, BACTERIA: NO GROWTH

## 2015-04-15 LAB — URINALYSIS, MICROSCOPIC ONLY
Bacteria, UA: NONE SEEN
Casts: NONE SEEN

## 2015-04-15 LAB — VITAMIN D 25 HYDROXY (VIT D DEFICIENCY, FRACTURES): VIT D 25 HYDROXY: 50 ng/mL (ref 30–100)

## 2015-04-15 LAB — INSULIN, RANDOM: Insulin: 6.2 u[IU]/mL (ref 2.0–19.6)

## 2015-04-15 LAB — HEMOGLOBIN A1C
Hgb A1c MFr Bld: 5.8 % — ABNORMAL HIGH (ref <5.7)
MEAN PLASMA GLUCOSE: 120 mg/dL — AB (ref <117)

## 2015-05-05 ENCOUNTER — Other Ambulatory Visit: Payer: Self-pay | Admitting: *Deleted

## 2015-05-05 MED ORDER — DULOXETINE HCL 60 MG PO CPEP: 60.0000 mg | ORAL_CAPSULE | Freq: Every day | ORAL | Status: DC

## 2015-07-15 ENCOUNTER — Ambulatory Visit: Payer: Self-pay | Admitting: Internal Medicine

## 2015-07-29 ENCOUNTER — Ambulatory Visit: Payer: Self-pay | Admitting: Internal Medicine

## 2016-03-01 ENCOUNTER — Ambulatory Visit: Payer: Self-pay | Admitting: Internal Medicine

## 2016-05-19 ENCOUNTER — Ambulatory Visit (INDEPENDENT_AMBULATORY_CARE_PROVIDER_SITE_OTHER): Payer: Managed Care, Other (non HMO) | Admitting: Internal Medicine

## 2016-05-19 ENCOUNTER — Other Ambulatory Visit: Payer: Self-pay | Admitting: Internal Medicine

## 2016-05-19 VITALS — BP 142/78 | HR 68 | Temp 97.5°F | Wt 240.8 lb

## 2016-05-19 DIAGNOSIS — E782 Mixed hyperlipidemia: Secondary | ICD-10-CM

## 2016-05-19 DIAGNOSIS — R7303 Prediabetes: Secondary | ICD-10-CM

## 2016-05-19 DIAGNOSIS — F329 Major depressive disorder, single episode, unspecified: Secondary | ICD-10-CM

## 2016-05-19 DIAGNOSIS — Z79899 Other long term (current) drug therapy: Secondary | ICD-10-CM | POA: Diagnosis not present

## 2016-05-19 DIAGNOSIS — G4700 Insomnia, unspecified: Secondary | ICD-10-CM | POA: Diagnosis not present

## 2016-05-19 DIAGNOSIS — F32A Depression, unspecified: Secondary | ICD-10-CM

## 2016-05-19 DIAGNOSIS — I10 Essential (primary) hypertension: Secondary | ICD-10-CM | POA: Diagnosis not present

## 2016-05-19 DIAGNOSIS — M1990 Unspecified osteoarthritis, unspecified site: Secondary | ICD-10-CM | POA: Diagnosis not present

## 2016-05-19 DIAGNOSIS — E559 Vitamin D deficiency, unspecified: Secondary | ICD-10-CM | POA: Diagnosis not present

## 2016-05-19 DIAGNOSIS — M797 Fibromyalgia: Secondary | ICD-10-CM

## 2016-05-19 DIAGNOSIS — L209 Atopic dermatitis, unspecified: Secondary | ICD-10-CM

## 2016-05-19 DIAGNOSIS — G4733 Obstructive sleep apnea (adult) (pediatric): Secondary | ICD-10-CM

## 2016-05-19 LAB — LIPID PANEL
CHOL/HDL RATIO: 2.3 ratio (ref <=5.0)
CHOLESTEROL: 192 mg/dL (ref 125–200)
HDL: 82 mg/dL (ref >=46)
LDL Cholesterol: 98 mg/dL (ref <130)
TRIGLYCERIDES: 62 mg/dL (ref <150)
VLDL: 12 mg/dL (ref <30)

## 2016-05-19 LAB — HEPATIC FUNCTION PANEL
ALT: 13 U/L (ref 6–29)
AST: 16 U/L (ref 10–35)
Albumin: 4 g/dL (ref 3.6–5.1)
Alkaline Phosphatase: 74 U/L (ref 33–130)
BILIRUBIN DIRECT: 0.1 mg/dL (ref <=0.2)
BILIRUBIN TOTAL: 0.4 mg/dL (ref 0.2–1.2)
Indirect Bilirubin: 0.3 mg/dL (ref 0.2–1.2)
Total Protein: 7.2 g/dL (ref 6.1–8.1)

## 2016-05-19 LAB — CBC WITH DIFFERENTIAL/PLATELET
Basophils Absolute: 0 cells/uL (ref 0–200)
Basophils Relative: 0 %
EOS ABS: 86 {cells}/uL (ref 15–500)
Eosinophils Relative: 1 %
HEMATOCRIT: 41.5 % (ref 35.0–45.0)
HEMOGLOBIN: 13.6 g/dL (ref 11.7–15.5)
LYMPHS ABS: 2150 {cells}/uL (ref 850–3900)
LYMPHS PCT: 25 %
MCH: 25.6 pg — ABNORMAL LOW (ref 27.0–33.0)
MCHC: 32.8 g/dL (ref 32.0–36.0)
MCV: 78.2 fL — AB (ref 80.0–100.0)
MONO ABS: 516 {cells}/uL (ref 200–950)
MPV: 11.4 fL (ref 7.5–12.5)
Monocytes Relative: 6 %
NEUTROS PCT: 68 %
Neutro Abs: 5848 cells/uL (ref 1500–7800)
Platelets: 332 10*3/uL (ref 140–400)
RBC: 5.31 MIL/uL — ABNORMAL HIGH (ref 3.80–5.10)
RDW: 16.1 % — AB (ref 11.0–15.0)
WBC: 8.6 10*3/uL (ref 3.8–10.8)

## 2016-05-19 LAB — BASIC METABOLIC PANEL WITH GFR
BUN: 20 mg/dL (ref 7–25)
CO2: 24 mmol/L (ref 20–31)
Calcium: 9.5 mg/dL (ref 8.6–10.4)
Chloride: 105 mmol/L (ref 98–110)
Creat: 0.76 mg/dL (ref 0.50–0.99)
GFR, Est African American: >89 mL/min (ref >=60)
GFR, Est Non African American: 86 mL/min (ref >=60)
GLUCOSE: 99 mg/dL (ref 65–99)
POTASSIUM: 4.8 mmol/L (ref 3.5–5.3)
Sodium: 141 mmol/L (ref 135–146)

## 2016-05-19 LAB — TSH: TSH: 1.49 mIU/L

## 2016-05-19 MED ORDER — AMITRIPTYLINE HCL 50 MG PO TABS: ORAL_TABLET | ORAL | 0 refills | Status: DC

## 2016-05-19 MED ORDER — DULOXETINE HCL 60 MG PO CPEP: 60.0000 mg | ORAL_CAPSULE | Freq: Every day | ORAL | 1 refills | Status: DC

## 2016-05-19 MED ORDER — CRISABOROLE 2 % EX OINT
1.0000 | TOPICAL_OINTMENT | Freq: Two times a day (BID) | CUTANEOUS | 0 refills | Status: DC
Start: 2016-05-19 — End: 2017-05-05

## 2016-05-19 MED ORDER — MELOXICAM 15 MG PO TABS: 15.0000 mg | ORAL_TABLET | Freq: Every day | ORAL | 0 refills | Status: DC

## 2016-05-19 NOTE — Progress Notes (Signed)
Assessment and Plan:  Hypertension:  -Continue medication,  -monitor blood pressure at home.  -Continue DASH diet.   -Reminder to go to the ER if any CP, SOB, nausea, dizziness, severe HA, changes vision/speech, left arm numbness and tingling, and jaw pain.  Cholesterol: -Continue diet and exercise.  -Check cholesterol.   Pre-diabetes: -Continue diet and exercise.  -Check A1C  Vitamin D Def: -check level -continue medications.   OSA currently untreated -repeat apnea link and potential referral to sleep medicine -may need CPAP  Insomnia -elavil at bedtime -work on sleep hygiene -recheck in a month  Joint pain -mobic  Eczema -eucrisa on hands  Depression -elavil -cont cymbalta -cont valium  Continue diet and meds as discussed. Further disposition pending results of labs.  HPI 60 y.o. female  presents for 3 month follow up with hypertension, hyperlipidemia, prediabetes and vitamin D.   Her blood pressure has been controlled at home, today their BP is BP: (!) 142/78.   She does not workout. She denies chest pain, shortness of breath, dizziness.  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     She is on cholesterol medication and denies myalgias. Her cholesterol is at goal. The cholesterol last visit was:   Lab Results  Component Value Date   CHOL 168 04/14/2015   HDL 61 04/14/2015   LDLCALC 87 04/14/2015   TRIG 99 04/14/2015   CHOLHDL 2.8 04/14/2015     She has not been working on diet and exercise for prediabetes, and denies foot ulcerations, hyperglycemia, hypoglycemia , increased appetite, nausea, paresthesia of the feet, polydipsia, polyuria, visual disturbances, vomiting and weight loss. Last A1C in the office was:  Lab Results  Component Value Date   HGBA1C 5.8 (H) 04/14/2015    Patient is on Vitamin D supplement.  Lab Results  Component Value Date   VD25OH 45 04/14/2015     She is having issues with staying asleep.  She reports  that she feels like part of it is that she is having some pain with sleeping.  She notes that she has a really hard time turing her brain off.  She feels like she is really emotional because she is so tired.  She reports that she does have to take her valium to help with getting her to relax.  She does have morning headaches.  She has not had witnessed apnea.  She does think she snores.  She has been diagnosed with sleep apnea in the past prior to her bypass surgery.    She is having a lot of hot flashes.  She reports that she is having a lot of hot flashes and they are causing her to feel bad and emotional.    She is feeling a little bit of stuffiness.  She has some relief with claritin or zyrtec.  She isn't sure whether this is causing her to have headaches.    She does have some numbness in her pinky and ring fingers on both sides.    She does not she is having some arthritis pain.  She is having   Current Medications:  Current Outpatient Prescriptions on File Prior to Visit  Medication Sig Dispense Refill  . Calcium Carbonate-Vit D-Min (CALCIUM 1200 PO) Take by mouth daily.    . Cholecalciferol (VITAMIN D PO) Take 20,000 Units by mouth daily.    . DULoxetine (CYMBALTA) 60 MG capsule Take 1 capsule (60 mg total) by mouth daily. 90 capsule 1  .  Multiple Vitamins-Minerals (MULTIVITAMIN WITH MINERALS) tablet Take 1 tablet by mouth daily.    . nitrofurantoin, macrocrystal-monohydrate, (MACROBID) 100 MG capsule Take 1 capsule (100 mg total) by mouth 2 (two) times daily. X 7 days 14 capsule 0   No current facility-administered medications on file prior to visit.     Medical History:  Past Medical History:  Diagnosis Date  . Adjustment disorder with mixed anxiety and depressed mood 11/25/2013  . Fibromyalgia   . H/O gastric bypass   . Mixed hyperlipidemia 11/25/2013    Allergies:  Allergies  Allergen Reactions  . E-Mycin [Erythromycin]     GI upset     Review of Systems:  Review of  Systems  Constitutional: Negative for chills, fever and malaise/fatigue.  HENT: Positive for congestion. Negative for ear pain and sore throat.   Eyes: Negative.   Respiratory: Negative for cough, shortness of breath and wheezing.   Cardiovascular: Negative for chest pain, palpitations and leg swelling.  Gastrointestinal: Negative for abdominal pain, blood in stool, constipation, diarrhea, heartburn and melena.  Genitourinary: Negative.   Musculoskeletal: Positive for joint pain.  Skin: Positive for rash.  Neurological: Positive for headaches. Negative for dizziness, sensory change and loss of consciousness.  Psychiatric/Behavioral: Positive for depression. The patient is nervous/anxious and has insomnia.     Family history- Review and unchanged  Social history- Review and unchanged  Physical Exam: BP (!) 142/78   Pulse 68   Temp 97.5 F (36.4 C)   Wt 240 lb 12.8 oz (109.2 kg)   SpO2 96%   BMI 41.66 kg/m  Wt Readings from Last 3 Encounters:  05/19/16 240 lb 12.8 oz (109.2 kg)  04/14/15 252 lb (114.3 kg)  12/31/14 262 lb (118.8 kg)    General Appearance: Well nourished well developed, in no apparent distress. Eyes: PERRLA, EOMs, conjunctiva no swelling or erythema ENT/Mouth: Ear canals normal without obstruction, swelling, erythma, discharge.  TMs normal bilaterally.  Oropharynx moist, clear, without exudate, or postoropharyngeal swelling. Neck: Supple, thyroid normal,no cervical adenopathy  Respiratory: Respiratory effort normal, Breath sounds clear A&P without rhonchi, wheeze, or rale.  No retractions, no accessory usage. Cardio: RRR with no MRGs. Brisk peripheral pulses without edema.  Abdomen: Soft, + BS,  Non tender, no guarding, rebound, hernias, masses. Musculoskeletal: Full ROM, 5/5 strength, Normal gait Skin: Warm, dry without rashes, lesions, ecchymosis.  Neuro: Awake and oriented X 3, Cranial nerves intact. Normal muscle tone, no cerebellar symptoms. Psych: Normal  affect, Insight and Judgment appropriate.    Starlyn Skeans, PA-C 10:33 AM [facility]

## 2016-05-19 NOTE — Patient Instructions (Signed)
Amitriptyline tablets  What is this medicine?  AMITRIPTYLINE (a mee TRIP ti leen) is used to treat depression.  This medicine may be used for other purposes; ask your health care provider or pharmacist if you have questions.  What should I tell my health care provider before I take this medicine?  They need to know if you have any of these conditions:  -an alcohol problem  -asthma, difficulty breathing  -bipolar disorder or schizophrenia  -difficulty passing urine, prostate trouble  -glaucoma  -heart disease or previous heart attack  -liver disease  -over active thyroid  -seizures  -thoughts or plans of suicide, a previous suicide attempt, or family history of suicide attempt  -an unusual or allergic reaction to amitriptyline, other medicines, foods, dyes, or preservatives  -pregnant or trying to get pregnant  -breast-feeding  How should I use this medicine?  Take this medicine by mouth with a drink of water. Follow the directions on the prescription label. You can take the tablets with or without food. Take your medicine at regular intervals. Do not take it more often than directed. Do not stop taking this medicine suddenly except upon the advice of your doctor. Stopping this medicine too quickly may cause serious side effects or your condition may worsen.  A special MedGuide will be given to you by the pharmacist with each prescription and refill. Be sure to read this information carefully each time.  Talk to your pediatrician regarding the use of this medicine in children. Special care may be needed.  Overdosage: If you think you have taken too much of this medicine contact a poison control center or emergency room at once.  NOTE: This medicine is only for you. Do not share this medicine with others.  What if I miss a dose?  If you miss a dose, take it as soon as you can. If it is almost time for your next dose, take only that dose. Do not take double or extra doses.  What may interact with this medicine?  Do not  take this medicine with any of the following medications:  -arsenic trioxide  -certain medicines used to regulate abnormal heartbeat or to treat other heart conditions  -cisapride  -droperidol  -halofantrine  -linezolid  -MAOIs like Carbex, Eldepryl, Marplan, Nardil, and Parnate  -methylene blue  -other medicines for mental depression  -phenothiazines like perphenazine, thioridazine and chlorpromazine  -pimozide  -probucol  -procarbazine  -sparfloxacin  -St. John's Wort  -ziprasidone  This medicine may also interact with the following medications:  -atropine and related drugs like hyoscyamine, scopolamine, tolterodine and others  -barbiturate medicines for inducing sleep or treating seizures, like phenobarbital  -cimetidine  -disulfiram  -ethchlorvynol  -thyroid hormones such as levothyroxine  This list may not describe all possible interactions. Give your health care provider a list of all the medicines, herbs, non-prescription drugs, or dietary supplements you use. Also tell them if you smoke, drink alcohol, or use illegal drugs. Some items may interact with your medicine.  What should I watch for while using this medicine?  Tell your doctor if your symptoms do not get better or if they get worse. Visit your doctor or health care professional for regular checks on your progress. Because it may take several weeks to see the full effects of this medicine, it is important to continue your treatment as prescribed by your doctor.  Patients and their families should watch out for new or worsening thoughts of suicide or depression. Also watch out   for sudden changes in feelings such as feeling anxious, agitated, panicky, irritable, hostile, aggressive, impulsive, severely restless, overly excited and hyperactive, or not being able to sleep. If this happens, especially at the beginning of treatment or after a change in dose, call your health care professional.  You may get drowsy or dizzy. Do not drive, use machinery, or  do anything that needs mental alertness until you know how this medicine affects you. Do not stand or sit up quickly, especially if you are an older patient. This reduces the risk of dizzy or fainting spells. Alcohol may interfere with the effect of this medicine. Avoid alcoholic drinks.  Do not treat yourself for coughs, colds, or allergies without asking your doctor or health care professional for advice. Some ingredients can increase possible side effects.  Your mouth may get dry. Chewing sugarless gum or sucking hard candy, and drinking plenty of water will help. Contact your doctor if the problem does not go away or is severe.  This medicine may cause dry eyes and blurred vision. If you wear contact lenses you may feel some discomfort. Lubricating drops may help. See your eye doctor if the problem does not go away or is severe.  This medicine can cause constipation. Try to have a bowel movement at least every 2 to 3 days. If you do not have a bowel movement for 3 days, call your doctor or health care professional.  This medicine can make you more sensitive to the sun. Keep out of the sun. If you cannot avoid being in the sun, wear protective clothing and use sunscreen. Do not use sun lamps or tanning beds/booths.  What side effects may I notice from receiving this medicine?  Side effects that you should report to your doctor or health care professional as soon as possible:  -allergic reactions like skin rash, itching or hives, swelling of the face, lips, or tongue  -abnormal production of milk in females  -breast enlargement in both males and females  -breathing problems  -confusion, hallucinations  -fast, irregular heartbeat  -fever with increased sweating  -muscle stiffness, or spasms  -pain or difficulty passing urine, loss of bladder control  -seizures  -suicidal thoughts or other mood changes  -swelling of the testicles  -tingling, pain, or numbness in the feet or hands  -yellowing of the eyes or  skin  Side effects that usually do not require medical attention (report to your doctor or health care professional if they continue or are bothersome):  -change in sex drive or performance  -constipation or diarrhea  -nausea, vomiting  -weight gain or loss  This list may not describe all possible side effects. Call your doctor for medical advice about side effects. You may report side effects to FDA at 1-800-FDA-1088.  Where should I keep my medicine?  Keep out of the reach of children.  Store at room temperature between 20 and 25 degrees C (68 and 77 degrees F). Throw away any unused medicine after the expiration date.  NOTE: This sheet is a summary. It may not cover all possible information. If you have questions about this medicine, talk to your doctor, pharmacist, or health care provider.     © 2016, [employer]vier/[employer]. (2012-02-21 13:50:32)

## 2016-05-20 LAB — HEMOGLOBIN A1C
Hgb A1c MFr Bld: 5.9 % — ABNORMAL HIGH (ref <5.7)
Mean Plasma Glucose: 123 mg/dL

## 2016-07-24 ENCOUNTER — Other Ambulatory Visit: Payer: Self-pay | Admitting: Internal Medicine

## 2016-07-24 ENCOUNTER — Encounter: Payer: Self-pay | Admitting: Internal Medicine

## 2016-07-24 MED ORDER — AMITRIPTYLINE HCL 50 MG PO TABS: ORAL_TABLET | ORAL | 4 refills | Status: DC

## 2016-09-01 ENCOUNTER — Other Ambulatory Visit: Payer: Self-pay | Admitting: Internal Medicine

## 2016-09-21 ENCOUNTER — Telehealth: Payer: Self-pay | Admitting: *Deleted

## 2016-09-21 NOTE — Telephone Encounter (Signed)
-----   Message from Unk Pinto, MD sent at 09/01/2016 10:59 PM EST ----- Regarding: appt Patient is due for OV and labs with [facility]

## 2016-09-21 NOTE — Telephone Encounter (Signed)
CALLED SEVERAL TIMES TRYING TO SCHEDULE A F/U OV & NO RESPONSE-

## 2017-04-22 ENCOUNTER — Other Ambulatory Visit: Payer: Self-pay | Admitting: Physician Assistant

## 2017-04-22 MED ORDER — DULOXETINE HCL 60 MG PO CPEP: 60.0000 mg | ORAL_CAPSULE | Freq: Every day | ORAL | 0 refills | Status: DC

## 2017-05-04 NOTE — Progress Notes (Signed)
Assessment and Plan:   Essential hypertension, benign - continue medications, DASH diet, exercise and monitor at home. Call if greater than 130/80.  - MONITOR BP AT HOME, SLIGHTLY ELEVATED TODAY, CALL IF ABOVE 140/90 - close follow up 1 month OV - Go to the ER if any CP, SOB, nausea, dizziness, severe HA, changes vision/speech -     CBC with Differential/Platelet -     BASIC METABOLIC PANEL WITH GFR -     Hepatic function panel -     TSH  Fibromyalgia Get back on cymbalta, start pool exercises, get back on amitriptyline at night  Mixed hyperlipidemia -continue medications, check lipids, decrease fatty foods, increase activity.  -     Lipid panel  Adjustment disorder with mixed anxiety and depressed mood As needed SHORT term -     diazepam (VALIUM) 2 MG tablet; 1/2-1 tablet as needed up to 2 x daily for anxiety or sleep  Prediabetes -     Hemoglobin A1c  Medication management -     Magnesium  OSA (obstructive sleep apnea) Has history of OSA, + fatigue in AM and daytime, + snoring, rule out sleep apnea-     Ambulatory referral to Sleep Studies   De Quervain's tenosynovitis Get thumb spica splint, mobic, ice, if not better will refer to ortho -     meloxicam (MOBIC) 15 MG tablet; Take one daily with food for 2 weeks, can take with tylenol, can not take with aleve, iburpofen, then as needed daily for pain  Anemia, unspecified type -     Iron and TIBC -     Vitamin B12  Continue diet and meds as discussed. Further disposition pending results of labs.  HPI  61 y.o. female  presents for 3 month follow up with hypertension, hyperlipidemia, prediabetes and vitamin D.  She has FM and depression, has had negative autoimmune work up and has seen rheum, suppose to be on cymbalta but ran out due to noncompliance with appointments. 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 She has constant fatigue, has body aches, but bilateral hands and  thumbs have been worse. + pain but no weakness, no lose in grip. Aleve does help.    Her blood pressure has been controlled at home, today their BP is   140/100.   She does not workout. She denies chest pain, shortness of breath, dizziness.    She is on cholesterol medication and denies myalgias. Her cholesterol is at goal. The cholesterol last visit was:   Lab Results  Component Value Date   CHOL 192 05/19/2016   HDL 82 05/19/2016   LDLCALC 98 05/19/2016   TRIG 62 05/19/2016   CHOLHDL 2.3 05/19/2016    She has not been working on diet and exercise for prediabetes, and denies foot ulcerations, hyperglycemia, hypoglycemia , increased appetite, nausea, paresthesia of the feet, polydipsia, polyuria, visual disturbances, vomiting and weight loss. Last A1C in the office was:  Lab Results  Component Value Date   HGBA1C 5.9 (H) 05/19/2016   Patient is on Vitamin D supplement.  Lab Results  Component Value Date   VD25OH 50 04/14/2015     BMI is Body mass index is 43.25 kg/m., she is working on diet and exercise. She has a history of a bypass, was diagnosed with OSA prior to this, not being treated at this time.  Wt Readings from Last 3 Encounters:  05/05/17 250 lb (113.4 kg)  05/19/16 240 lb 12.8 oz (109.2 kg)  04/14/15 252 lb (114.3 kg)     Current Medications:  Current Outpatient Prescriptions on File Prior to Visit  Medication Sig Dispense Refill  . Calcium Carbonate-Vit D-Min (CALCIUM 1200 PO) Take by mouth daily.    . DULoxetine (CYMBALTA) 60 MG capsule Take 1 capsule (60 mg total) by mouth daily. 90 capsule 0  . meloxicam (MOBIC) 15 MG tablet TAKE ONE TABLET BY MOUTH DAILY 90 tablet 0  . Multiple Vitamins-Minerals (MULTIVITAMIN WITH MINERALS) tablet Take 1 tablet by mouth daily.     No current facility-administered medications on file prior to visit.     Medical History:  Past Medical History:  Diagnosis Date  . Adjustment disorder with mixed anxiety and depressed mood  11/25/2013  . Fibromyalgia   . H/O gastric bypass   . Mixed hyperlipidemia 11/25/2013    Allergies:  Allergies  Allergen Reactions  . E-Mycin [Erythromycin]     GI upset     Review of Systems:  Review of Systems  Constitutional: Negative for chills, fever and malaise/fatigue.  HENT: Negative for congestion, ear pain and sore throat.   Eyes: Negative.   Respiratory: Negative for cough, shortness of breath and wheezing.   Cardiovascular: Negative for chest pain, palpitations and leg swelling.  Gastrointestinal: Negative for abdominal pain, blood in stool, constipation, diarrhea, heartburn and melena.  Genitourinary: Negative.   Musculoskeletal: Negative for joint pain.  Skin: Negative for rash.  Neurological: Negative for dizziness, sensory change, loss of consciousness and headaches.  Psychiatric/Behavioral: Positive for depression. The patient is nervous/anxious and has insomnia.     Family history- Review and unchanged  Social history- Review and unchanged  Physical Exam: Pulse 69   Temp 98.1 F (36.7 C)   Resp 16   Ht 5' 3.75" (1.619 m)   Wt 250 lb (113.4 kg)   SpO2 97%   BMI 43.25 kg/m  Wt Readings from Last 3 Encounters:  05/05/17 250 lb (113.4 kg)  05/19/16 240 lb 12.8 oz (109.2 kg)  04/14/15 252 lb (114.3 kg)    General Appearance: Well nourished well developed, in no apparent distress. Eyes: PERRLA, EOMs, conjunctiva no swelling or erythema ENT/Mouth: Ear canals normal without obstruction, swelling, erythma, discharge.  TMs normal bilaterally.  Oropharynx moist, clear, without exudate, or postoropharyngeal swelling. Neck: Supple, thyroid normal,no cervical adenopathy  Respiratory: Respiratory effort normal, Breath sounds clear A&P without rhonchi, wheeze, or rale.  No retractions, no accessory usage. Cardio: RRR with no MRGs. Brisk peripheral pulses without edema.  Abdomen: Soft, + BS,  Non tender, no guarding, rebound, hernias, masses. 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  Neuro: Awake and oriented X 3, Cranial nerves intact. Normal muscle tone, no cerebellar symptoms. Psych: Normal affect, Insight and Judgment appropriate.    Vicie Mutters, PA-C 11:24 AM [facility]

## 2017-05-05 ENCOUNTER — Encounter: Payer: Self-pay | Admitting: Physician Assistant

## 2017-05-05 ENCOUNTER — Ambulatory Visit (INDEPENDENT_AMBULATORY_CARE_PROVIDER_SITE_OTHER): Payer: BLUE CROSS/BLUE SHIELD | Admitting: Physician Assistant

## 2017-05-05 ENCOUNTER — Other Ambulatory Visit: Payer: Self-pay

## 2017-05-05 VITALS — HR 69 | Temp 98.1°F | Resp 16 | Ht 63.75 in | Wt 250.0 lb

## 2017-05-05 DIAGNOSIS — D649 Anemia, unspecified: Secondary | ICD-10-CM | POA: Diagnosis not present

## 2017-05-05 DIAGNOSIS — M797 Fibromyalgia: Secondary | ICD-10-CM | POA: Diagnosis not present

## 2017-05-05 DIAGNOSIS — E782 Mixed hyperlipidemia: Secondary | ICD-10-CM | POA: Diagnosis not present

## 2017-05-05 DIAGNOSIS — G4733 Obstructive sleep apnea (adult) (pediatric): Secondary | ICD-10-CM | POA: Diagnosis not present

## 2017-05-05 DIAGNOSIS — M654 Radial styloid tenosynovitis [de Quervain]: Secondary | ICD-10-CM

## 2017-05-05 DIAGNOSIS — I10 Essential (primary) hypertension: Secondary | ICD-10-CM

## 2017-05-05 DIAGNOSIS — R7303 Prediabetes: Secondary | ICD-10-CM | POA: Diagnosis not present

## 2017-05-05 DIAGNOSIS — F4323 Adjustment disorder with mixed anxiety and depressed mood: Secondary | ICD-10-CM | POA: Diagnosis not present

## 2017-05-05 DIAGNOSIS — Z79899 Other long term (current) drug therapy: Secondary | ICD-10-CM

## 2017-05-05 LAB — CBC WITH DIFFERENTIAL/PLATELET
BASOS ABS: 0 {cells}/uL (ref 0–200)
BASOS PCT: 0 %
Eosinophils Absolute: 69 cells/uL (ref 15–500)
Eosinophils Relative: 1 %
HEMATOCRIT: 40.3 % (ref 35.0–45.0)
Hemoglobin: 13 g/dL (ref 11.7–15.5)
LYMPHS PCT: 31 %
Lymphs Abs: 2139 cells/uL (ref 850–3900)
MCH: 26.5 pg — AB (ref 27.0–33.0)
MCHC: 32.3 g/dL (ref 32.0–36.0)
MCV: 82.1 fL (ref 80.0–100.0)
MONO ABS: 276 {cells}/uL (ref 200–950)
MPV: 11.2 fL (ref 7.5–12.5)
Monocytes Relative: 4 %
NEUTROS ABS: 4416 {cells}/uL (ref 1500–7800)
Neutrophils Relative %: 64 %
Platelets: 293 10*3/uL (ref 140–400)
RBC: 4.91 MIL/uL (ref 3.80–5.10)
RDW: 15.3 % — ABNORMAL HIGH (ref 11.0–15.0)
WBC: 6.9 10*3/uL (ref 3.8–10.8)

## 2017-05-05 MED ORDER — MELOXICAM 15 MG PO TABS: ORAL_TABLET | ORAL | 1 refills | Status: DC

## 2017-05-05 MED ORDER — AMITRIPTYLINE HCL 50 MG PO TABS: ORAL_TABLET | ORAL | 1 refills | Status: DC

## 2017-05-05 MED ORDER — DIAZEPAM 2 MG PO TABS: ORAL_TABLET | ORAL | 0 refills | Status: DC

## 2017-05-05 NOTE — Patient Instructions (Signed)
Get on cymbalta Monitor BP  Get thumb spica splint and wear at night If not better will send to ortho for injection  Take the mobic once daily with food  De Quervain Tenosynovitis Tendons attach muscles to bones. They also help with joint movements. When tendons become irritated or swollen, it is called tendinitis. The extensor pollicis brevis (EPB) tendon connects the EPB muscle to a bone that is near the base of the thumb. The EPB muscle helps to straighten and extend the thumb. De Quervain tenosynovitis is a condition in which the EPB tendon lining (sheath) becomes irritated, thickened, and swollen. This condition is sometimes called stenosing tenosynovitis. This condition causes pain on the thumb side of the back of the wrist. What are the causes? Causes of this condition include:  Activities that repeatedly cause your thumb and wrist to extend.  A sudden increase in activity or change in activity that affects your wrist.  What increases the risk? This condition is more likely to develop in:  Females.  People who have diabetes.  Women who have recently given birth.  People who are over 82 years of age.  People who do activities that involve repeated hand and wrist motions, such as tennis, racquetball, volleyball, gardening, and taking care of children.  People who do heavy labor.  People who have poor wrist strength and flexibility.  People who do not warm up properly before activities.  What are the signs or symptoms? Symptoms of this condition include:  Pain or tenderness over the thumb side of the back of the wrist when your thumb and wrist are not moving.  Pain that gets worse when you straighten your thumb or extend your thumb or wrist.  Pain when the injured area is touched.  Locking or catching of the thumb joint while you bend and straighten your thumb.  Decreased thumb motion due to pain.  Swelling over the affected area.  How is this diagnosed? This  condition is diagnosed with a medical history and physical exam. Your health care provider will ask for details about your injury and ask about your symptoms. How is this treated? Treatment may include the use of icing and medicines to reduce pain and swelling. You may also be advised to wear a splint or brace to limit your thumb and wrist motion. In less severe cases, treatment may also include working with a physical therapist to strengthen your wrist and calm the irritation around your EPB tendon sheath. In severe cases, surgery may be needed. Follow these instructions at home: If you have a splint or brace:  Wear it as told by your health care provider. Remove it only as told by your health care provider.  Loosen the splint or brace if your fingers become numb and tingle, or if they turn cold and blue.  Keep the splint or brace clean and dry. Managing pain, stiffness, and swelling  If directed, apply ice to the injured area. ? Put ice in a plastic bag. ? Place a towel between your skin and the bag. ? Leave the ice on for 20 minutes, 2-3 times per day.  Move your fingers often to avoid stiffness and to lessen swelling.  Raise (elevate) the injured area above the level of your heart while you are sitting or lying down. General instructions  Return to your normal activities as told by your health care provider. Ask your health care provider what activities are safe for you.  Take over-the-counter and prescription medicines only  as told by your health care provider.  Keep all follow-up visits as told by your health care provider. This is important.  Do not drive or operate heavy machinery while taking prescription pain medicine. Contact a health care provider if:  Your pain, tenderness, or swelling gets worse, even if you have had treatment.  You have numbness or tingling in your wrist, hand, or fingers on the injured side. This information is not intended to replace advice given  to you by your health care provider. Make sure you discuss any questions you have with your health care provider. Document Released: 10/04/2005 Document Revised: 03/11/2016 Document Reviewed: 12/10/2014 Elsevier Interactive Patient Education  [employer].  I think it is possible that you have sleep apnea. It can cause interrupted sleep, headaches, frequent awakenings, fatigue, dry mouth, fast/slow heart beats, memory issues, anxiety/depression, swelling, numbness tingling hands/feet, weight gain, shortness of breath, and the list goes on. Sleep apnea needs to be ruled out because if it is left untreated it does eventually lead to abnormal heart beats, lung failure or heart failure as well as increasing the risk of heart attack and stroke. There are masks you can wear OR a mouth piece that I can give you information about. Often times though people feel MUCH better after getting treatment.   Sleep Apnea  Sleep apnea is a sleep disorder characterized by abnormal pauses in breathing while you sleep. When your breathing pauses, the level of oxygen in your blood decreases. This causes you to move out of deep sleep and into light sleep. As a result, your quality of sleep is poor, and the system that carries your blood throughout your body (cardiovascular system) experiences stress. If sleep apnea remains untreated, the following conditions can develop:  High blood pressure (hypertension).  Coronary artery disease.  Inability to achieve or maintain an erection (impotence).  Impairment of your thought process (cognitive dysfunction). There are three types of sleep apnea: 1. Obstructive sleep apnea--Pauses in breathing during sleep because of a blocked airway. 2. Central sleep apnea--Pauses in breathing during sleep because the area of the brain that controls your breathing does not send the correct signals to the muscles that control breathing. 3. Mixed sleep apnea--A combination of both  obstructive and central sleep apnea.  RISK FACTORS The following risk factors can increase your risk of developing sleep apnea:  Being overweight.  Smoking.  Having narrow passages in your nose and throat.  Being of older age.  Being female.  Alcohol use.  Sedative and tranquilizer use.  Ethnicity. Among individuals younger than 35 years, African Americans are at increased risk of sleep apnea. SYMPTOMS   Difficulty staying asleep.  Daytime sleepiness and fatigue.  Loss of energy.  Irritability.  Loud, heavy snoring.  Morning headaches.  Trouble concentrating.  Forgetfulness.  Decreased interest in sex. DIAGNOSIS  In order to diagnose sleep apnea, your caregiver will perform a physical examination. Your caregiver may suggest that you take a home sleep test. Your caregiver may also recommend that you spend the night in a sleep lab. In the sleep lab, several monitors record information about your heart, lungs, and brain while you sleep. Your leg and arm movements and blood oxygen level are also recorded. TREATMENT The following actions may help to resolve mild sleep apnea:  Sleeping on your side.   Using a decongestant if you have nasal congestion.   Avoiding the use of depressants, including alcohol, sedatives, and narcotics.   Losing weight and modifying  your diet if you are overweight. There also are devices and treatments to help open your airway:  Oral appliances. These are custom-made mouthpieces that shift your lower jaw forward and slightly open your bite. This opens your airway.  Devices that create positive airway pressure. This positive pressure "splints" your airway open to help you breathe better during sleep. The following devices create positive airway pressure:  Continuous positive airway pressure (CPAP) device. The CPAP device creates a continuous level of air pressure with an air pump. The air is delivered to your airway through a mask while you  sleep. This continuous pressure keeps your airway open.  Nasal expiratory positive airway pressure (EPAP) device. The EPAP device creates positive air pressure as you exhale. The device consists of single-use valves, which are inserted into each nostril and held in place by adhesive. The valves create very little resistance when you inhale but create much more resistance when you exhale. That increased resistance creates the positive airway pressure. This positive pressure while you exhale keeps your airway open, making it easier to breath when you inhale again.  Bilevel positive airway pressure (BPAP) device. The BPAP device is used mainly in patients with central sleep apnea. This device is similar to the CPAP device because it also uses an air pump to deliver continuous air pressure through a mask. However, with the BPAP machine, the pressure is set at two different levels. The pressure when you exhale is lower than the pressure when you inhale.  Surgery. Typically, surgery is only done if you cannot comply with less invasive treatments or if the less invasive treatments do not improve your condition. Surgery involves removing excess tissue in your airway to create a wider passage way. Document Released: 09/24/2002 Document Revised: 01/29/2013 Document Reviewed: 02/10/2012 [facility] Patient Information 2015 [place], [place]. This information is not intended to replace advice given to you by your health care provider. Make sure you discuss any questions you have with your health care provider.

## 2017-05-06 LAB — HEPATIC FUNCTION PANEL
ALBUMIN: 3.6 g/dL (ref 3.6–5.1)
ALK PHOS: 62 U/L (ref 33–130)
ALT: 12 U/L (ref 6–29)
AST: 16 U/L (ref 10–35)
Bilirubin, Direct: 0.1 mg/dL (ref <=0.2)
Indirect Bilirubin: 0.2 mg/dL (ref 0.2–1.2)
TOTAL PROTEIN: 6.6 g/dL (ref 6.1–8.1)
Total Bilirubin: 0.3 mg/dL (ref 0.2–1.2)

## 2017-05-06 LAB — LIPID PANEL
Cholesterol: 189 mg/dL (ref <200)
HDL: 69 mg/dL (ref >50)
LDL CALC: 106 mg/dL — AB (ref <100)
Total CHOL/HDL Ratio: 2.7 Ratio (ref <5.0)
Triglycerides: 71 mg/dL (ref <150)
VLDL: 14 mg/dL (ref <30)

## 2017-05-06 LAB — BASIC METABOLIC PANEL WITH GFR
BUN: 15 mg/dL (ref 7–25)
CHLORIDE: 105 mmol/L (ref 98–110)
CO2: 21 mmol/L (ref 20–31)
CREATININE: 0.64 mg/dL (ref 0.50–0.99)
Calcium: 9.2 mg/dL (ref 8.6–10.4)
GFR, Est African American: >89 mL/min (ref >=60)
GFR, Est Non African American: >89 mL/min (ref >=60)
GLUCOSE: 83 mg/dL (ref 65–99)
POTASSIUM: 5 mmol/L (ref 3.5–5.3)
Sodium: 140 mmol/L (ref 135–146)

## 2017-05-06 LAB — IRON AND TIBC
%SAT: 14 % (ref 11–50)
IRON: 57 ug/dL (ref 45–160)
TIBC: 406 ug/dL (ref 250–450)
UIBC: 349 ug/dL

## 2017-05-06 LAB — HEMOGLOBIN A1C
HEMOGLOBIN A1C: 5.7 % — AB (ref <5.7)
MEAN PLASMA GLUCOSE: 117 mg/dL

## 2017-05-06 LAB — TSH: TSH: 1.21 mIU/L

## 2017-05-06 LAB — MAGNESIUM: MAGNESIUM: 2 mg/dL (ref 1.5–2.5)

## 2017-05-06 LAB — VITAMIN B12: Vitamin B-12: 316 pg/mL (ref 200–1100)

## 2017-06-02 NOTE — Progress Notes (Signed)
Assessment and Plan:  Essential hypertension, benign -     bisoprolol (ZEBETA) 5 MG tablet; Take 1 tablet (5 mg total) by mouth daily. - Weight loss advised - gets sleep study eval Monday - close follow up 4-6 weeks  Fibromyalgia Continue cymbalta, increase exercise, continue to decrease stress  Adjustment disorder with mixed anxiety and depressed mood -     bisoprolol (ZEBETA) 5 MG tablet; Take 1 tablet (5 mg total) by mouth daily. - get on CPAP  Insomnia, unspecified type Has sleep study eval Monday  Need for shingles vaccine -     Varicella-zoster vaccine subcutaneous   Future Appointments Date Time Provider [place]  06/06/2017 9:00 AM Dohmeier, Asencion Partridge, MD [facility] None  07/13/2017 11:15 AM Vicie Mutters, PA-C [facility] None    HPI 61 y.o.female presents for 1 month follow up for HTN, depression.  She was restarted on her cymbalta last visit and states that this is helping.  BP has still been elevated, will add on BB at night for sleep and BP  She also had thumb pain with + finkelstein and instructed to get a splint.  Has sleep referral and goes to see Dr. Roxy Horseman on Monday. Will take valium as needed, has taken 5 this month, will do if stressed. Has lots of stress trying to pay off credit plan debt.  BMI is Body mass index is 42.87 kg/m., she is working on diet and exercise. Wt Readings from Last 3 Encounters:  06/03/17 247 lb 12.8 oz (112.4 kg)  05/05/17 250 lb (113.4 kg)  05/19/16 240 lb 12.8 oz (109.2 kg)    Past Medical History:  Diagnosis Date  . Adjustment disorder with mixed anxiety and depressed mood 11/25/2013  . Fibromyalgia   . H/O gastric bypass   . Mixed hyperlipidemia 11/25/2013     Allergies  Allergen Reactions  . E-Mycin [Erythromycin]     GI upset    Current Outpatient Prescriptions on File Prior to Visit  Medication Sig  . amitriptyline (ELAVIL) 50 MG tablet Take 1-2 tablets daily at bedtime as needed for sleep  . Calcium  Carbonate-Vit D-Min (CALCIUM 1200 PO) Take by mouth daily.  . diazepam (VALIUM) 2 MG tablet 1/2-1 tablet as needed up to 2 x daily for anxiety or sleep  . DULoxetine (CYMBALTA) 60 MG capsule Take 1 capsule (60 mg total) by mouth daily.  . meloxicam (MOBIC) 15 MG tablet Take one daily with food for 2 weeks, can take with tylenol, can not take with aleve, iburpofen, then as needed daily for pain  . Multiple Vitamins-Minerals (MULTIVITAMIN WITH MINERALS) tablet Take 1 tablet by mouth daily.   No current facility-administered medications on file prior to visit.     ROS: all negative except above.   Physical Exam: Filed Weights   06/03/17 1051  Weight: 247 lb 12.8 oz (112.4 kg)   BP (!) 144/100 Comment: right arm  Pulse 87   Temp 97.7 F (36.5 C)   Resp 16   Ht 5' 3.75" (1.619 m)   Wt 247 lb 12.8 oz (112.4 kg)   SpO2 96%   BMI 42.87 kg/m  General Appearance: Well nourished, in no apparent distress. Eyes: PERRLA, EOMs, conjunctiva no swelling or erythema Sinuses: No Frontal/maxillary tenderness ENT/Mouth: Ext aud canals clear, TMs without erythema, bulging. No erythema, swelling, or exudate on post pharynx.  Tonsils not swollen or erythematous. Hearing normal.  Neck: Supple, thyroid normal.  Respiratory: Respiratory effort normal, BS equal bilaterally without rales,  rhonchi, wheezing or stridor.  Cardio: RRR with no MRGs. Brisk peripheral pulses without edema.  Abdomen: Soft, + BS.  Non tender, no guarding, rebound, hernias, masses. Lymphatics: Non tender without lymphadenopathy.  Musculoskeletal: Full ROM, 5/5 strength, normal gait.  Skin: Warm, dry without rashes, lesions, ecchymosis.  Neuro: Cranial nerves intact. Normal muscle tone, no cerebellar symptoms. Sensation intact.  Psych: Awake and oriented X 3, normal affect, Insight and Judgment appropriate.     Vicie Mutters, PA-C 1:04 PM [facility]

## 2017-06-03 ENCOUNTER — Encounter: Payer: Self-pay | Admitting: Physician Assistant

## 2017-06-03 ENCOUNTER — Ambulatory Visit (INDEPENDENT_AMBULATORY_CARE_PROVIDER_SITE_OTHER): Payer: BLUE CROSS/BLUE SHIELD | Admitting: Physician Assistant

## 2017-06-03 VITALS — BP 144/100 | HR 87 | Temp 97.7°F | Resp 16 | Ht 63.75 in | Wt 247.8 lb

## 2017-06-03 DIAGNOSIS — I10 Essential (primary) hypertension: Secondary | ICD-10-CM | POA: Diagnosis not present

## 2017-06-03 DIAGNOSIS — M797 Fibromyalgia: Secondary | ICD-10-CM | POA: Diagnosis not present

## 2017-06-03 DIAGNOSIS — F4323 Adjustment disorder with mixed anxiety and depressed mood: Secondary | ICD-10-CM | POA: Diagnosis not present

## 2017-06-03 DIAGNOSIS — Z23 Encounter for immunization: Secondary | ICD-10-CM

## 2017-06-03 DIAGNOSIS — G4700 Insomnia, unspecified: Secondary | ICD-10-CM | POA: Diagnosis not present

## 2017-06-03 MED ORDER — BISOPROLOL FUMARATE 5 MG PO TABS: 5.0000 mg | ORAL_TABLET | Freq: Every day | ORAL | 3 refills | Status: DC

## 2017-06-03 NOTE — Patient Instructions (Addendum)
Start ziac 5mg  1 at night for sleep Can still do valium AS needed And can take amitriptyline for sleep  Look up vionic shoes  Monitor your blood pressure at home. Go to the ER if any CP, SOB, nausea, dizziness, severe HA, changes vision/speech  Goal BP:  For patients younger than 60: Goal BP < 140/90. For patients 60 and older: Goal BP < 150/90. For patients with diabetes: Goal BP < 140/90. Your most recent BP: BP: (!) 144/100 (right arm)   Take your medications faithfully as instructed. Maintain a healthy weight. Get at least 150 minutes of aerobic exercise per week. Minimize salt intake. Minimize alcohol intake  DASH Eating Plan DASH stands for "Dietary Approaches to Stop Hypertension." The DASH eating plan is a healthy eating plan that has been shown to reduce high blood pressure (hypertension). Additional health benefits may include reducing the risk of type 2 diabetes mellitus, heart disease, and stroke. The DASH eating plan may also help with weight loss. WHAT DO I NEED TO KNOW ABOUT THE DASH EATING PLAN? For the DASH eating plan, you will follow these general guidelines:  Choose foods with a percent daily value for sodium of less than 5% (as listed on the food label).  Use salt-free seasonings or herbs instead of table salt or sea salt.  Check with your health care provider or pharmacist before using salt substitutes.  Eat lower-sodium products, often labeled as "lower sodium" or "no salt added."  Eat fresh foods.  Eat more vegetables, fruits, and low-fat dairy products.  Choose whole grains. Look for the word "whole" as the first word in the ingredient list.  Choose fish and skinless chicken or [place] more often than red meat. Limit fish, poultry, and meat to 6 oz (170 g) each day.  Limit sweets, desserts, sugars, and sugary drinks.  Choose heart-healthy fats.  Limit cheese to 1 oz (28 g) per day.  Eat more home-cooked food and less restaurant, buffet, and fast  food.  Limit fried foods.  Cook foods using methods other than frying.  Limit canned vegetables. If you do use them, rinse them well to decrease the sodium.  When eating at a restaurant, ask that your food be prepared with less salt, or no salt if possible. WHAT FOODS CAN I EAT? Seek help from a dietitian for individual calorie needs. Grains Whole grain or whole wheat bread. Brown rice. Whole grain or whole wheat pasta. Quinoa, bulgur, and whole grain cereals. Low-sodium cereals. Corn or whole wheat flour tortillas. Whole grain cornbread. Whole grain crackers. Low-sodium crackers. Vegetables Fresh or frozen vegetables (raw, steamed, roasted, or grilled). Low-sodium or reduced-sodium tomato and vegetable juices. Low-sodium or reduced-sodium tomato sauce and paste. Low-sodium or reduced-sodium canned vegetables.  Fruits All fresh, canned (in natural juice), or frozen fruits. Meat and Other Protein Products Ground beef (85% or leaner), grass-fed beef, or beef trimmed of fat. Skinless chicken or [place]. Ground chicken or [place]. Pork trimmed of fat. All fish and seafood. Eggs. Dried beans, peas, or lentils. Unsalted nuts and seeds. Unsalted canned beans. Dairy Low-fat dairy products, such as skim or 1% milk, 2% or reduced-fat cheeses, low-fat ricotta or cottage cheese, or plain low-fat yogurt. Low-sodium or reduced-sodium cheeses. Fats and Oils Tub margarines without trans fats. Light or reduced-fat mayonnaise and salad dressings (reduced sodium). Avocado. Safflower, olive, or canola oils. Natural peanut or almond butter. Other Unsalted popcorn and pretzels. The items listed above may not be a complete list of recommended foods  or beverages. Contact your dietitian for more options. WHAT FOODS ARE NOT RECOMMENDED? Grains White bread. White pasta. White rice. Refined cornbread. Bagels and croissants. Crackers that contain trans fat. Vegetables Creamed or fried vegetables. Vegetables in a  cheese sauce. Regular canned vegetables. Regular canned tomato sauce and paste. Regular tomato and vegetable juices. Fruits Dried fruits. Canned fruit in light or heavy syrup. Fruit juice. Meat and Other Protein Products Fatty cuts of meat. Ribs, chicken wings, bacon, sausage, bologna, salami, chitterlings, fatback, hot dogs, bratwurst, and packaged luncheon meats. Salted nuts and seeds. Canned beans with salt. Dairy Whole or 2% milk, cream, half-and-half, and cream cheese. Whole-fat or sweetened yogurt. Full-fat cheeses or blue cheese. Nondairy creamers and whipped toppings. Processed cheese, cheese spreads, or cheese curds. Condiments Onion and garlic salt, seasoned salt, table salt, and sea salt. Canned and packaged gravies. Worcestershire sauce. Tartar sauce. Barbecue sauce. Teriyaki sauce. Soy sauce, including reduced sodium. Steak sauce. Fish sauce. Oyster sauce. Cocktail sauce. Horseradish. Ketchup and mustard. Meat flavorings and tenderizers. Bouillon cubes. Hot sauce. Tabasco sauce. Marinades. Taco seasonings. Relishes. Fats and Oils Butter, stick margarine, lard, shortening, ghee, and bacon fat. Coconut, palm kernel, or palm oils. Regular salad dressings. Other Pickles and olives. Salted popcorn and pretzels. The items listed above may not be a complete list of foods and beverages to avoid. Contact your dietitian for more information. WHERE CAN I FIND MORE INFORMATION? National Heart, Lung, and Blood Institute: travelstabloid.com Document Released: 09/23/2011 Document Revised: 02/18/2014 Document Reviewed: 08/08/2013 [facility] Patient Information 2015 [place], [place]. This information is not intended to replace advice given to you by your health care provider. Make sure you discuss any questions you have with your health care provider.

## 2017-06-06 ENCOUNTER — Ambulatory Visit (INDEPENDENT_AMBULATORY_CARE_PROVIDER_SITE_OTHER): Payer: BLUE CROSS/BLUE SHIELD | Admitting: Neurology

## 2017-06-06 ENCOUNTER — Encounter: Payer: Self-pay | Admitting: Neurology

## 2017-06-06 VITALS — BP 157/77 | HR 70 | Ht 63.0 in | Wt 252.0 lb

## 2017-06-06 DIAGNOSIS — R7303 Prediabetes: Secondary | ICD-10-CM | POA: Diagnosis not present

## 2017-06-06 DIAGNOSIS — R0683 Snoring: Secondary | ICD-10-CM

## 2017-06-06 DIAGNOSIS — E662 Morbid (severe) obesity with alveolar hypoventilation: Secondary | ICD-10-CM

## 2017-06-06 DIAGNOSIS — R635 Abnormal weight gain: Secondary | ICD-10-CM

## 2017-06-06 DIAGNOSIS — I10 Essential (primary) hypertension: Secondary | ICD-10-CM | POA: Diagnosis not present

## 2017-06-06 DIAGNOSIS — E66813 Obesity, class 3: Secondary | ICD-10-CM

## 2017-06-06 DIAGNOSIS — Z6841 Body Mass Index (BMI) 40.0 and over, adult: Secondary | ICD-10-CM | POA: Diagnosis not present

## 2017-06-06 DIAGNOSIS — Z9884 Bariatric surgery status: Secondary | ICD-10-CM

## 2017-06-06 DIAGNOSIS — K439 Ventral hernia without obstruction or gangrene: Secondary | ICD-10-CM | POA: Diagnosis not present

## 2017-06-06 DIAGNOSIS — R5382 Chronic fatigue, unspecified: Secondary | ICD-10-CM | POA: Diagnosis not present

## 2017-06-06 NOTE — Patient Instructions (Signed)

## 2017-06-06 NOTE — Progress Notes (Signed)
SLEEP MEDICINE CLINIC   Provider:  Larey Seat, M D  Primary Care Physician:  Unk Pinto, MD   Referring Provider: Unk Pinto, MD    Chief Complaint  Patient presents with  . New Patient (Initial Visit)    pt alone, 2006 had a sleep study which she was never told had sleep apnea until she went to the surgery work up for gastric bypass and they stated she did but never gave her any treatment for it. Currently pt says snores if she is exhausted. pt doesnt complain of having difficulty fallling asleep she has problems staying medications. pt says average 5-6 hours of uninterupted sleep. pt admits to not having the best sleep habits.     HPI:  Nicole Padilla is a 61 y.o. female , seen here as in a referral/ revisit  from Dr. Melford Aase for a sleep evaluation,   Chief complaint according to patient : " I may have apnea again, as I gained weight".   I have the pleasure of meeting Nicole Padilla today, on 06/06/2017, who had undergone bariatric surgery in form of a gastric bypass in the year 2006.   When she went for surgery her surgeons and the postoperative recovery room had noted that she had sleep apnea but at the time it was figured that as she loses weight this would resolve although.  The patient was about 350 pounds heavy at the time of her surgery and went down to 135 pounds, but had severe complications. She had an intestinal rupture postoperatively,at 6 months postop. She had to undergo another operation this time with peritonitis. She developed abdominal abscesses and had multiple procedures to drain those, she had to wait for secondary wound healing. Her total recovery was now almost 5 months and she could begin eating regular food, feeling pain free and resumed working. At the time she was full time gainfully employed and in the meantime she has become self-employed an independent but this also means more irregular work hours many more work hours overall at a different  stress. Her past medical history is endorsed for anemia, unspecific type. I think in her case this means both iron absorption and vitamin B12 absorption are tricky, she had de Quervain's tenosynovitis, Obstructive sleep apnea, daytime fatigue and snoring, magnesium deficiency, pre-diabetes with elevated hemoglobin A1c, anxiety treated with Valium, hyperlipidemia, myalgic pain ( rheumatology work up ) , and hypertension.  Sleep habits are as follows:  The patient's bedtime can vary greatly but she usually obtains from mid night to be in bed and sleep. The bedroom as core, quiet and dark. She sleeps alone , no pets in the bed. Over the last couple of months she has developed orthopnea, she now prefers not to sleep in bed but in a recliner or on a couch in a sitting position, barely reclined. She avoids joint pain this way, her habitual sleep position is on her left side, one regular pillow for head support. She infrequently has nocturia, she can usually sleep for about 4-6 hours in one block. Sometimes she will spontaneously wake up but usually she has to arise by about 7 AM and at that time has gotten more than 6 hours of sleep. She does not feel refreshed or restored in the morning it takes a lot of strength to power through the day. Twice a week she may take an afternoon nap of up to 3 hours duration, in spite of setting her alarm for 30 minutes.  Sleep  medical history and family sleep history:  Father Alzheimers, CAD, vein stripping;  Mother had pleurisy/ COPD , anemia, severe osteoporosis, compression facture.  Oldest sister has MS, middle sister osteoarthritis.    Social history: Radiographer, therapeutic. The patient was exposed to passive tobacco or secondhand smoke but has never smoked herself, she does not use any form of tobacco, she does not drink any alcohol, she drinks lots of coffee, iced tea, hot tea and diet soda.  Review of Systems: Out of a complete 14 system review, the  patient complains of only the following symptoms, and all other reviewed systems are negative.  joint pain, muscle pain, achy, fatigue.   Epworth score 9 , Fatigue severity score 26  , depression score 2/ 15    Social History   Social History  . Marital status: Single    Spouse name: N/A  . Number of children: N/A  . Years of education: N/A   Occupational History  . Not on file.   Social History Main Topics  . Smoking status: Never Smoker  . Smokeless tobacco: Never Used  . Alcohol use Not on file  . Drug use: Unknown  . Sexual activity: Not on file   Other Topics Concern  . Not on file   Social History Narrative  . No narrative on file    Family History  Problem Relation Age of Onset  . Hypertension Mother   . Heart disease Mother   . Osteoporosis Mother   . Emphysema Mother   . Alzheimer's disease Father   . Multiple sclerosis Sister     Past Medical History:  Diagnosis Date  . Adjustment disorder with mixed anxiety and depressed mood 11/25/2013  . Fibromyalgia   . H/O gastric bypass   . Mixed hyperlipidemia 11/25/2013    Past Surgical History:  Procedure Laterality Date  . APPENDECTOMY    . GASTRIC BYPASS    . salivary stones removed    . TONSILLECTOMY    . WISDOM TOOTH EXTRACTION      Current Outpatient Prescriptions  Medication Sig Dispense Refill  . amitriptyline (ELAVIL) 50 MG tablet Take 1-2 tablets daily at bedtime as needed for sleep 60 tablet 1  . bisoprolol (ZEBETA) 5 MG tablet Take 1 tablet (5 mg total) by mouth daily. 30 tablet 3  . Calcium Carbonate-Vit D-Min (CALCIUM 1200 PO) Take by mouth daily.    . diazepam (VALIUM) 2 MG tablet 1/2-1 tablet as needed up to 2 x daily for anxiety or sleep 30 tablet 0  . DULoxetine (CYMBALTA) 60 MG capsule Take 1 capsule (60 mg total) by mouth daily. 90 capsule 0  . meloxicam (MOBIC) 15 MG tablet Take one daily with food for 2 weeks, can take with tylenol, can not take with aleve, iburpofen, then as needed  daily for pain 30 tablet 1  . Multiple Vitamins-Minerals (MULTIVITAMIN WITH MINERALS) tablet Take 1 tablet by mouth daily.     No current facility-administered medications for this visit.     Allergies as of 06/06/2017 - Review Complete 06/06/2017  Allergen Reaction Noted  . E-mycin [erythromycin]  11/18/2013    Vitals: BP (!) 157/77 Comment: pt just took her BP medications  Pulse 70   Ht 5\' 3"  (1.6 m)   Wt 252 lb (114.3 kg)   BMI 44.64 kg/m  Last Weight:  Wt Readings from Last 1 Encounters:  06/06/17 252 lb (114.3 kg)   YJE:HUDJ mass index is 44.64 kg/m.  Last Height:   Ht Readings from Last 1 Encounters:  06/06/17 5\' 3"  (1.6 m)    Physical exam:  General: The patient is awake, alert and appears not in acute distress. The patient is well groomed. Head: Normocephalic, atraumatic. Neck is supple. Mallampati 3,  neck circumference:16.5 . Nasal airflow patent , TMJ click mild , but  evident . Retrognathia is not seen.  Cardiovascular:  Regular rate and rhythm, without  murmurs or carotid bruit, and without distended neck veins. Respiratory: Lungs are clear to auscultation. Skin:  Without evidence of edema, or rash Trunk: BMI is 45, again . The patient's posture is erect   Neurologic exam :The patient is awake and alert, oriented to place and time.   Memory subjective described as intact. Attention span & concentration ability appears normal.  Speech is fluent, without dysarthria, dysphonia or aphasia. Mood and affect are appropriate.  Cranial nerves: Taste intact, smell impaired ( reduced over the last 4-3 years). Pupils are equal and briskly reactive to light. Funduscopic exam deferred.  Extraocular movements  in vertical and horizontal planes intact and without nystagmus. Visual fields by finger perimetry are intact. Hearing to finger rub intact. Facial sensation intact to fine touch. Facial motor strength is symmetric and tongue and uvula move midline. Shoulder shrug  was symmetrical.  Motor exam:   Normal tone, muscle bulk and symmetric strength in all extremities. Sensory:  Fine touch, pinprick and vibration were tested in all extremities. Proprioception tested in the upper extremities was normal. Coordination: Rapid alternating movements in the fingers/hands was normal. Finger-to-nose maneuver normal without evidence of ataxia, dysmetria or tremor. Gait and station: Patient walks without assistive device and is able unassisted to climb up to the exam table. Strength within normal limits.Stance is stable and normal.   Deep tendon reflexes: in the  upper and lower extremities are symmetric and intact.     Assessment:  After physical and neurologic examination, review of laboratory studies,  Personal review of imaging studies, reports of other /same  Imaging studies, results of polysomnography and / or neurophysiology testing and pre-existing records as far as provided in visit., my assessment is   1) status post bypass, weight 250 pounds, BMI is back to morbid or almost super obesity. She wakes up with a parched, dry mouth and considers herself a habitual mouth breather even in daytime. I do think that is very likely that she has obstructive sleep apnea, that she snoring and that her daytime fatigue and loss of energy are related. I would like to evaluate this patient because of the status post bypass surgery in a split-night polysomnography, attended study. Status post BiPAP patient's often less tolerant of CPAP and need BiPAP. It would help [place] to determine the right therapeutic module for the patient if he could see her in attended sleep study.  The patient was advised of the nature of the diagnosed disorder , the treatment options and the  risks for general health and wellness arising from not treating the condition.   I spent more than 55 minutes of face to face time with the patient.  Greater than 50% of time was spent in counseling and coordination of care.  We have discussed the diagnosis and differential and I answered the patient's questions.    Plan:  Treatment plan and additional workup :  SPLIT night with capnography, patient has become orthopnoeic, has diaphoresis, has again a BMI that is morbidly obese. We need to evaluate her for CO2 retention,  hypoxemia, and chose a modality in an attended sleep study.   This patient takes Valium , up to 5 mg a day, Cymbalta  for anxiety , mood disorder. These medications are likely to affect her AHI further.   Larey Seat, MD 04/07/9470, 2:52 AM  Certified in Neurology by ABPN Certified in [place] by [facility] [address] [place], [place] [address]

## 2017-07-08 ENCOUNTER — Telehealth: Payer: Self-pay | Admitting: Neurology

## 2017-07-08 NOTE — Telephone Encounter (Signed)
We have attempted to call the patient 3 times to schedule sleep study. Patient has been unavailable at the phone numbers we have on file and has not returned our calls. At this point we will send a letter asking pt to please contact the sleep lab to schedule their sleep study. If patient calls back we will schedule them for their sleep study. ° °

## 2017-07-11 NOTE — Progress Notes (Signed)
Assessment and Plan:  Essential hypertension, benign -     bisoprolol (ZEBETA) 5 MG tablet; Take 1 tablet (5 mg total) by mouth daily continue - add on losartan 50mg  - Weight loss advised - gets sleep study  - close follow up 4-6 weeks  Fibromyalgia Continue cymbalta, increase exercise, continue to decrease stress  Adjustment disorder with mixed anxiety and depressed mood -     bisoprolol (ZEBETA) 5 MG tablet; Take 1 tablet (5 mg total) by mouth daily. Continue cymbalta - get on CPAP  Insomnia, unspecified type Call Dr. Beacher May and set up appt  Future Appointments Date Time Provider [place]  08/09/2017 4:15 PM Vicie Mutters, PA-C [facility] None    HPI 61 y.o.female presents for 1 month follow up for HTN, depression.  She was restarted on her cymbalta last visit and states that this is helping.  BP has still been elevated even with BB at night for sleep/BP and BP BP: 140/86.  Has sleep referral and goes to see Dr. Roxy Horseman on Monday. Will take valium as needed.  Has lots of stress trying to pay off credit plan debt.  She has had cold symptoms x 1 week.  BMI is Body mass index is 46.91 kg/m., she is working on diet and exercise. She has increased water. Has been doing a lot of fast food due to having a lot of shows, driving to [place].  Wt Readings from Last 3 Encounters:  07/13/17 264 lb 12.8 oz (120.1 kg)  06/06/17 252 lb (114.3 kg)  06/03/17 247 lb 12.8 oz (112.4 kg)    Past Medical History:  Diagnosis Date  . Adjustment disorder with mixed anxiety and depressed mood 11/25/2013  . Fibromyalgia   . H/O gastric bypass   . Mixed hyperlipidemia 11/25/2013     Allergies  Allergen Reactions  . E-Mycin [Erythromycin]     GI upset    Current Outpatient Prescriptions on File Prior to Visit  Medication Sig  . bisoprolol (ZEBETA) 5 MG tablet Take 1 tablet (5 mg total) by mouth daily.  . Calcium Carbonate-Vit D-Min (CALCIUM 1200 PO) Take by mouth daily.    . DULoxetine (CYMBALTA) 60 MG capsule Take 1 capsule (60 mg total) by mouth daily.  . Multiple Vitamins-Minerals (MULTIVITAMIN WITH MINERALS) tablet Take 1 tablet by mouth daily.   No current facility-administered medications on file prior to visit.     ROS: all negative except above.   Physical Exam: Filed Weights   07/13/17 1112  Weight: 264 lb 12.8 oz (120.1 kg)   BP 140/86   Pulse 64   Temp 97.9 F (36.6 C)   Resp 18   Ht 5\' 3"  (1.6 m)   Wt 264 lb 12.8 oz (120.1 kg)   SpO2 97%   BMI 46.91 kg/m  General Appearance: Well nourished, in no apparent distress. Eyes: PERRLA, EOMs, conjunctiva no swelling or erythema Sinuses: No Frontal/maxillary tenderness ENT/Mouth: Ext aud canals clear, TMs without erythema, bulging. No erythema, swelling, or exudate on post pharynx.  Tonsils not swollen or erythematous. Hearing normal.  Neck: Supple, thyroid normal.  Respiratory: Respiratory effort normal, BS equal bilaterally without rales, rhonchi, wheezing or stridor.  Cardio: RRR with no MRGs. Brisk peripheral pulses without edema.  Abdomen: Soft, + BS.  Non tender, no guarding, rebound, hernias, masses. Lymphatics: Non tender without lymphadenopathy.  Musculoskeletal: Full ROM, 5/5 strength, normal gait.  Skin: Warm, dry without rashes, lesions, ecchymosis.  Neuro: Cranial nerves intact. Normal muscle tone, no  cerebellar symptoms. Sensation intact.  Psych: Awake and oriented X 3, normal affect, Insight and Judgment appropriate.     Vicie Mutters, PA-C 12:14 PM [facility]

## 2017-07-13 ENCOUNTER — Ambulatory Visit (INDEPENDENT_AMBULATORY_CARE_PROVIDER_SITE_OTHER): Payer: BLUE CROSS/BLUE SHIELD | Admitting: Physician Assistant

## 2017-07-13 VITALS — BP 140/86 | HR 64 | Temp 97.9°F | Resp 18 | Ht 63.0 in | Wt 264.8 lb

## 2017-07-13 DIAGNOSIS — I10 Essential (primary) hypertension: Secondary | ICD-10-CM | POA: Diagnosis not present

## 2017-07-13 DIAGNOSIS — Z79899 Other long term (current) drug therapy: Secondary | ICD-10-CM

## 2017-07-13 DIAGNOSIS — E559 Vitamin D deficiency, unspecified: Secondary | ICD-10-CM

## 2017-07-13 DIAGNOSIS — E782 Mixed hyperlipidemia: Secondary | ICD-10-CM | POA: Diagnosis not present

## 2017-07-13 DIAGNOSIS — R7303 Prediabetes: Secondary | ICD-10-CM

## 2017-07-13 DIAGNOSIS — F4323 Adjustment disorder with mixed anxiety and depressed mood: Secondary | ICD-10-CM | POA: Diagnosis not present

## 2017-07-13 DIAGNOSIS — M797 Fibromyalgia: Secondary | ICD-10-CM

## 2017-07-13 DIAGNOSIS — J0100 Acute maxillary sinusitis, unspecified: Secondary | ICD-10-CM

## 2017-07-13 MED ORDER — AMITRIPTYLINE HCL 50 MG PO TABS: ORAL_TABLET | ORAL | 1 refills | Status: DC

## 2017-07-13 MED ORDER — PREDNISONE 20 MG PO TABS: ORAL_TABLET | ORAL | 0 refills | Status: DC

## 2017-07-13 MED ORDER — BENZONATATE 100 MG PO CAPS: 100.0000 mg | ORAL_CAPSULE | Freq: Three times a day (TID) | ORAL | 0 refills | Status: DC | PRN

## 2017-07-13 MED ORDER — DIAZEPAM 2 MG PO TABS: ORAL_TABLET | ORAL | 0 refills | Status: DC

## 2017-07-13 MED ORDER — LOSARTAN POTASSIUM 50 MG PO TABS: 50.0000 mg | ORAL_TABLET | Freq: Every day | ORAL | 11 refills | Status: DC

## 2017-07-13 MED ORDER — AZITHROMYCIN 250 MG PO TABS: ORAL_TABLET | ORAL | 1 refills | Status: AC

## 2017-07-13 NOTE — Patient Instructions (Addendum)
Call back Dr. Brett Fairy to schedule sleep study  Continue bisoprolol and losartan at night  Do valium AS needed for sleep not nightly  New guidelines suggest the benzodiazepines are best short term, with prolonged use they lead to physical and psychological dependence. In addition, evidence suggest that for insomnia the effectiveness wanes in 4 weeks and the risks out weight their benefits. Use of these agents have been associated with dementia, falls, motor vehicle accidents and physical addiction. Decreasing these medication have been proven to show improvements in cognition, alertness, decrease of falls and daytime sedation.   We will start a slow taper, symptoms of withdrawal include, insomnia, anxiety, irritability, sweating and stomach or intestinal symptoms like diarrhea or nausea.   Make sure you are on an allergy pill, see below for more details. Please take the prednisone as directed below, this is NOT an antibiotic so you do NOT have to finish it. You can take it for a few days and stop it if you are doing better.   Please take the prednisone to help decrease inflammation and therefore decrease symptoms. Take it it with food to avoid GI upset. It can cause increased energy but on the other hand it can make it hard to sleep at night so please take it AT [place], it takes 8-12 hours to start working so it will NOT affect your sleeping if you take it at night with your food!!  If you are diabetic it will increase your sugars so decrease carbs and monitor your sugars closely.      HOW TO TREAT VIRAL COUGH AND COLD SYMPTOMS:  -Symptoms usually last at least 1 week with the worst symptoms being around day 4.  - colds usually start with a sore throat and end with a cough, and the cough can take 2 weeks to get better.  -No antibiotics are needed for colds, flu, sore throats, cough, bronchitis UNLESS symptoms are longer than 7 days OR if you are getting better then get drastically  worse.  -There are a lot of combination medications (Dayquil, Nyquil, Vicks 44, tyelnol cold and sinus, ETC). Please look at the ingredients on the back so that you are treating the correct symptoms and not doubling up on medications/ingredients.    Medicines you can use  Nasal congestion  - pseudoephedrine (Sudafed)- behind the counter, do not use if you have high blood pressure, medicine that have -D in them.  - phenylephrine (Sudafed PE) -Dextormethorphan + chlorpheniramine (Coridcidin HBP)- okay if you have high blood pressure -Oxymetazoline (Afrin) nasal spray- LIMIT to 3 days -Saline nasal spray -Neti pot (used distilled or bottled water)  Ear pain/congestion  -pseudoephedrine (sudafed) - Nasonex/flonase nasal spray  Fever  -Acetaminophen (Tyelnol) -Ibuprofen (Advil, motrin, aleve)  Sore Throat  -Acetaminophen (Tyelnol) -Ibuprofen (Advil, motrin, aleve) -Drink a lot of water -Gargle with salt water - Rest your voice (don't talk) -Throat sprays -Cough drops  Body Aches  -Acetaminophen (Tyelnol) -Ibuprofen (Advil, motrin, aleve)  Headache  -Acetaminophen (Tyelnol) -Ibuprofen (Advil, motrin, aleve) - Exedrin, Exedrin Migraine  Allergy symptoms (cough, sneeze, runny nose, itchy eyes) -Claritin or loratadine cheapest but likely the weakest  -Zyrtec or certizine at night because it can make you sleepy -The strongest is allegra or fexafinadine  Cheapest at [employer], [employer], [employer]  Cough  -Dextromethorphan (Delsym)- medicine that has DM in it -Guafenesin (Mucinex/Robitussin) - cough drops - drink lots of water  Chest Congestion  -Guafenesin (Mucinex/Robitussin)  Red Itchy Eyes  - Naphcon-A  Upset Stomach  - Bland diet (nothing spicy, greasy, fried, and high acid foods like tomatoes, oranges, berries) -OKAY- cereal, bread, soup, crackers, rice -Eat smaller more frequent meals -reduce caffeine, no alcohol -Loperamide (Imodium-AD) if diarrhea -Prevacid for  heart burn  General health when sick  -Hydration -wash your hands frequently -keep surfaces clean -change pillow cases and sheets often -Get fresh air but do not exercise strenuously -Vitamin D, double up on it - Vitamin C -Zinc

## 2017-07-18 ENCOUNTER — Encounter: Payer: Self-pay | Admitting: Physician Assistant

## 2017-08-01 ENCOUNTER — Other Ambulatory Visit: Payer: Self-pay

## 2017-08-01 MED ORDER — BISOPROLOL FUMARATE 5 MG PO TABS: 5.0000 mg | ORAL_TABLET | Freq: Every day | ORAL | 0 refills | Status: DC

## 2017-08-09 ENCOUNTER — Encounter: Payer: Self-pay | Admitting: Physician Assistant

## 2017-08-09 ENCOUNTER — Ambulatory Visit (INDEPENDENT_AMBULATORY_CARE_PROVIDER_SITE_OTHER): Payer: BLUE CROSS/BLUE SHIELD | Admitting: Physician Assistant

## 2017-08-09 VITALS — BP 142/86 | HR 71 | Temp 97.5°F | Resp 14 | Ht 63.0 in | Wt 254.6 lb

## 2017-08-09 DIAGNOSIS — F4323 Adjustment disorder with mixed anxiety and depressed mood: Secondary | ICD-10-CM | POA: Diagnosis not present

## 2017-08-09 DIAGNOSIS — I10 Essential (primary) hypertension: Secondary | ICD-10-CM

## 2017-08-09 MED ORDER — LOSARTAN POTASSIUM 100 MG PO TABS: 100.0000 mg | ORAL_TABLET | Freq: Every day | ORAL | 1 refills | Status: DC

## 2017-08-09 MED ORDER — BUPROPION HCL ER (XL) 150 MG PO TB24: 150.0000 mg | ORAL_TABLET | ORAL | 2 refills | Status: DC

## 2017-08-09 NOTE — Progress Notes (Signed)
Subjective:    Patient ID: Nicole Padilla, female    DOB: [birth year], 61 y.o.   MRN: [id number]  HPI Patient is here for 4 week follow up for BP and mood. She is doing sleep study Nov 14th.  She has been having BP still 140's at home. She has had increased stress, trouble concentrating, requesting something else for mood.   BMI is Body mass index is 45.1 kg/m., she is working on diet and exercise. Wt Readings from Last 3 Encounters:  08/09/17 254 lb 9.6 oz (115.5 kg)  07/13/17 264 lb 12.8 oz (120.1 kg)  06/06/17 252 lb (114.3 kg)     Blood pressure (!) 142/86, pulse 71, temperature (!) 97.5 F (36.4 C), resp. rate 14, height 5\' 3"  (1.6 m), weight 254 lb 9.6 oz (115.5 kg), SpO2 97 %.  Medications Current Outpatient Prescriptions on File Prior to Visit  Medication Sig  . amitriptyline (ELAVIL) 50 MG tablet Take 1-2 tablets daily at bedtime as needed for sleep  . bisoprolol (ZEBETA) 5 MG tablet Take 1 tablet (5 mg total) by mouth daily.  . Calcium Carbonate-Vit D-Min (CALCIUM 1200 PO) Take by mouth daily.  . diazepam (VALIUM) 2 MG tablet 1/2-1 tablet as needed up to 2 x daily for anxiety or sleep  . DULoxetine (CYMBALTA) 60 MG capsule Take 1 capsule (60 mg total) by mouth daily.  Marland Kitchen losartan (COZAAR) 50 MG tablet Take 1 tablet (50 mg total) by mouth daily.  . Multiple Vitamins-Minerals (MULTIVITAMIN WITH MINERALS) tablet Take 1 tablet by mouth daily.   No current facility-administered medications on file prior to visit.     Problem list She has Fibromyalgia; H/O gastric bypass; Mixed hyperlipidemia; Adjustment disorder with mixed anxiety and depressed mood; Prediabetes; Essential hypertension, benign; Medication management; and Vitamin D deficiency on her problem list.  Review of Systems  Constitutional: Positive for fatigue.  HENT: Negative.   Respiratory: Negative.   Cardiovascular: Negative.   Gastrointestinal: Negative.   Genitourinary: Negative.   Musculoskeletal:  Negative.   Skin: Negative.   Neurological: Negative.   Hematological: Negative.   Psychiatric/Behavioral: Positive for sleep disturbance. The patient is nervous/anxious.        Objective:   Physical Exam  Constitutional: She is oriented to person, place, and time. She appears well-developed and well-nourished.  HENT:  Head: Normocephalic and atraumatic.  Right Ear: External ear normal.  Left Ear: External ear normal.  Mouth/Throat: Oropharynx is clear and moist.  Eyes: Pupils are equal, round, and reactive to light. Conjunctivae and EOM are normal.  Neck: Normal range of motion. Neck supple. No thyromegaly present.  Cardiovascular: Normal rate, regular rhythm and normal heart sounds.  Exam reveals no gallop and no friction rub.   No murmur heard. Pulmonary/Chest: Effort normal and breath sounds normal. No respiratory distress. She has no wheezes.  Abdominal: Soft. Bowel sounds are normal. She exhibits no distension and no mass. There is no tenderness. There is no rebound and no guarding.  Musculoskeletal: Normal range of motion.  Lymphadenopathy:    She has no cervical adenopathy.  Neurological: She is alert and oriented to person, place, and time. She displays normal reflexes. No cranial nerve deficit. Coordination normal.  Skin: Skin is warm and dry.  Psychiatric: She has a normal mood and affect.       Assessment & Plan:    Essential hypertension, benign - continue medications, increase losartan to 100mg , continue weight loss, DASH diet, exercise and monitor at home. Call  if greater than 130/80.  -     losartan (COZAAR) 100 MG tablet; Take 1 tablet (100 mg total) by mouth daily.  Anxiety/depression Will add on wellbutrin short term for situational anxiety, will stop if any serotonin syndrome -     buPROPion (WELLBUTRIN XL) 150 MG 24 hr tablet; Take 1 tablet (150 mg total) by mouth every morning.  Morbid Obesity with co morbidities - long discussion about weight loss,  diet, and exercise  Close follow up 4-6 weeks   Future Appointments Date Time Provider [place]  08/28/2017 8:00 PM [facility] None

## 2017-08-09 NOTE — Patient Instructions (Addendum)
Add on wellbutrin 150mg  daily with the cymbalta  If you get shaky, anxious, flushing, diarrhea cut back on the Cymbalta to 30 mg OR stop the wellbutrin Increase losartan to 100mg  and continue the bisoprolol at 5 mg  Serotonin Syndrome Serotonin is a brain chemical that regulates the nervous system, which includes the brain, spinal cord, and nerves. Serotonin appears to play a role in all types of behavior, including appetite, emotions, movement, thinking, and response to stress. Excessively high levels of serotonin in the body can cause serotonin syndrome, which is a very dangerous condition. What are the causes? This condition can be caused by taking medicines or drugs that increase the level of serotonin in your body. These include:  Antidepressant medicines.  Migraine medicines.  Certain pain medicines.  Certain recreational drugs, including ecstasy, LSD, cocaine, and amphetamines.  Over-the-counter cough or cold medicines that contain dextromethorphan.  Certain herbal supplements, including St. John's wort, ginseng, and nutmeg.  This condition usually occurs when you take these medicines or drugs in combination, but it can also happen with a high dose of a single medicine or drug. What increases the risk? This condition is more likely to develop in:  People who have recently increased the dosage of medicine that increases the serotonin level.  People who just started taking medicine that increases the serotonin level.  What are the signs or symptoms? Symptoms of this condition usually happens within several hours of a medicine change. Symptoms include:  Headache.  Muscle twitching or stiffness.  Diarrhea.  Confusion.  Restlessness or agitation.  Shivering or goose bumps.  Loss of muscle coordination.  Rapid heart rate.  Sweating.  Severe cases of serotonin syndromecan cause:  Irregular heartbeat.  Seizures.  Loss of consciousness.  High fever.  How is  this diagnosed? This condition is diagnosed with a medical history and physical exam. You will be asked aboutyour symptoms and your use of medicines and recreational drugs. Your health care provider may also order lab work or additional tests to rule out other causes of your symptoms. How is this treated? The treatment for this condition depends on the severity of your symptoms. For mild cases, stopping the medicine that caused your condition is usually all that is needed. For moderate to severe cases, hospitalization is required to monitor you and to prevent further muscle damage. Follow these instructions at home:  Take over-the-counter and prescription medicines only as told by your health care provider. This is important.  Check with your health care provider before you start taking any new prescriptions, over-the-counter medicines, herbs, or supplements.  Avoid combining any medicines that can cause this condition to occur.  Keep all follow-up visits as told by your health care provider.This is important.  Maintain a healthy lifestyle. ? Eat healthy foods. ? Get plenty of sleep. ? Exercise regularly. ? Do not drink alcohol. ? Do not use recreational drugs. Contact a health care provider if:  Medicines do not seem to be helping.  Your symptoms do not improve or they get worse.  You have trouble taking care of yourself. Get help right away if:  You have worsening confusion, severe headache, chest pain, high fever, seizures, or loss of consciousness.  You have serious thoughts about hurting yourself or others.  You experience serious side effects of medicine, such as swelling of your face, lips, tongue, or throat. This information is not intended to replace advice given to you by your health care provider. Make sure you discuss any  questions you have with your health care provider. Document Released: 11/11/2004 Document Revised: 05/29/2016 Document Reviewed:  10/17/2014 Elsevier Interactive Patient Education  [employer].

## 2017-08-16 ENCOUNTER — Encounter: Payer: Self-pay | Admitting: Physician Assistant

## 2017-08-16 MED ORDER — DULOXETINE HCL 60 MG PO CPEP: 60.0000 mg | ORAL_CAPSULE | Freq: Every day | ORAL | 1 refills | Status: DC

## 2017-08-29 ENCOUNTER — Ambulatory Visit (INDEPENDENT_AMBULATORY_CARE_PROVIDER_SITE_OTHER): Payer: BLUE CROSS/BLUE SHIELD | Admitting: Neurology

## 2017-08-29 DIAGNOSIS — R7303 Prediabetes: Secondary | ICD-10-CM

## 2017-08-29 DIAGNOSIS — I10 Essential (primary) hypertension: Secondary | ICD-10-CM

## 2017-08-29 DIAGNOSIS — R5382 Chronic fatigue, unspecified: Secondary | ICD-10-CM

## 2017-08-29 DIAGNOSIS — K439 Ventral hernia without obstruction or gangrene: Secondary | ICD-10-CM

## 2017-08-29 DIAGNOSIS — R635 Abnormal weight gain: Secondary | ICD-10-CM

## 2017-08-29 DIAGNOSIS — G4733 Obstructive sleep apnea (adult) (pediatric): Secondary | ICD-10-CM | POA: Diagnosis not present

## 2017-08-29 DIAGNOSIS — R0683 Snoring: Secondary | ICD-10-CM

## 2017-09-05 NOTE — Procedures (Signed)
PATIENT'S NAME:  Nicole Padilla, Nicole Padilla DOB:      April 02, 1956      MRN#:    [id number]     DATE OF RECORDING: 08/29/2017 REFERRING M.D.:  Unk Pinto, M.D. Study Performed:   Baseline Polysomnogram HISTORY:  The 61 y.o. female Patient is seen here as in a referral from Dr. Melford Aase for a sleep evaluation, She had undergone bariatric surgery ( gastric bypass , 2006). After surgery her surgeons and the postoperative recovery room had noted that she had sleep apnea -but at the time it was assumed that as she loses weight this would resolve although. Over the last couple of months she has developed orthopnea, she now prefers not to sleep in bed but in a recliner or on a couch in a sitting position, barely reclined.   Dx: Obstructive sleep apnea, daytime fatigue and snoring, magnesium deficiency, pre-diabetes with elevated hemoglobin A1C, anxiety treated with Valium, hyperlipidemia, myalgia pain (rheumatology work up), and hypertension.  The patient endorsed the Epworth Sleepiness Scale at 9 points.  The patient's weight 252 pounds with a height of 63 (inches), resulting in a BMI of 44.5 kg/m2.The patient's neck circumference measured 16.5 inches.  CURRENT MEDICATIONS: Elavil; Zebeta; Calcium/vitamin D; Valium; Cymbalta; Mobic   PROCEDURE:  This is a multichannel digital polysomnogram utilizing the Somnostar 11.2 system.  Electrodes and sensors were applied and monitored per AASM Specifications.   EEG, EOG, Chin and Limb EMG, were sampled at 200 Hz.  ECG, Snore and Nasal Pressure, Thermal Airflow, Respiratory Effort, CPAP Flow and Pressure, Oximetry was sampled at 50 Hz. Digital video and audio were recorded.      BASELINE STUDY : Lights Out was at 21:47 and Lights On at 05:00.  Total recording time (TRT) was 433.5 minutes, with a total sleep time (TST) of 408.5 minutes.   The patient's sleep latency was 20 minutes.  REM latency was 0 minutes.  The sleep efficiency was 94.2 %.     SLEEP ARCHITECTURE: WASO  (Wake after sleep onset) was 5 minutes.  There were 4.5 minutes in Stage N1, 285 minutes Stage N2, 119 minutes Stage N3 and 0 minutes in Stage REM.  The percentage of Stage N1 was 1.1%, Stage N2 was 69.8%, Stage N3 was 29.1% and Stage R (REM sleep) was 0%.   RESPIRATORY ANALYSIS:  There were a total of 0 respiratory events:  0 apneas and 0 hypopneas with 0 respiratory event related arousals (RERAs). The total APNEA/HYPOPNEA INDEX (AHI) was 0.0/hour and the total RESPIRATORY DISTURBANCE INDEX was 0.0 /hour.  0 events occurred in REM sleep and 0 events in NREM. The REM AHI was 0.0 /hour, versus a non-REM AHI of 0.0. The patient spent 408.5 minutes of total sleep time in the supine position and 0 minutes in non-supine. The supine AHI was 0.0 versus a non-supine AHI of 0.0.  OXYGEN SATURATION & C02:  The Wake baseline 02 saturation was 98%, with the lowest being 85%. Time spent below 89% saturation equaled 8 minutes. End Tidal CO2 during sleep was 41.1 torr.  During REM, the average End Tidal CO2 was 0 torr.  During NREM, the average End Tidal CO2 was 41.1 torr.  Total sleep time greater than 40 torr was 75.50 minutes. Peak CO2 was 50 torr, there was no sleep time over 1 minute above 50 torr .    PERIODIC LIMB MOVEMENTS:  The patient had a total of 92 Periodic Limb Movements.  The Periodic Limb Movement (PLM) index was 13.5  and the PLM Arousal index was 3.1/hr. The arousals were noted as: 85 were spontaneous, 21 were associated with PLMs, and 0 were associated with respiratory events.  Audio and video analysis did not show any abnormal or unusual movements, behaviors, phonations or vocalizations.  The patient did not experience nocturia. Moderate Snoring was noted. EKG was in keeping with normal sinus rhythm (NSR).  Post-study, the patient indicated that sleep was the same as usual.    IMPRESSION:  1. No evidence of Obstructive Sleep Apnea (OSA) while patient rested on a wedge pillow, reclined.   2. Mild  Periodic Limb Movement Disorder (PLMD) 3. Moderate Snoring. 4. No hypoxemia or hypercapnia of clinical significance  RECOMMENDATIONS: I found no evidence of sleep apnea being present, nor were the changes in CO2 and SpO2 consistent with obesity hypoventilation.  Snoring can be treated with weight loss and dental device.   1. Remain sleeping in reclined position. 2. Avoid sedative-hypnotics which may worsen sleep apnea, including alcohol and tobacco (as applicable). 3. Advise to lose weight by diet and exercise, if not contraindicated (BMI 44.5). 4. Further information may be obtained from [employer] (www.sleepfoundation.org) or American Sleep Apnea Association (www.sleepapnea.org). 5. Avoid caffeine-containing beverages and chocolate to reduce restless legs / PLMs. 6. A follow up appointment will be scheduled in the Sleep Clinic at [facility]. The referring provider will be notified of the results.     I certify that I have reviewed the entire raw data recording prior to the issuance of this report in accordance with the Standards of Accreditation of the American Academy of Sleep Medicine (AASM)   Larey Seat, MD     09-05-2017 Diplomat, American Board of Psychiatry and Neurology  Diplomat, American Board of [place] Director, [facility] [employer]

## 2017-09-06 ENCOUNTER — Telehealth: Payer: Self-pay | Admitting: Neurology

## 2017-09-06 NOTE — Telephone Encounter (Signed)
I called pt. I advised pt that Dr. Brett Fairy reviewed pt's sleep study and found that patient did not have sleep apnea. Dr. Brett Fairy recommends that pt works on weight loss. I reviewed sleep hygiene recommendations with the pt, including trying to keep a regular sleep wake schedule, avoiding electronics in the bedroom, keeping the bedroom cool, dark, and quiet, and avoiding eating or exercising within 2 hours of bedtime as well as eating in the middle of the night. I advised pt to keep pets away from the bedtime. I discussed with pt the importance of stress relief and to try meditation, deep breathing exercises, and/or a white noise machine or fan to diffuse other noise distracters. I advised pt to not drink alcohol before bedtime and to never mix alcohol and sedating medications. Pt was advised to avoid narcotic pain medication close to bedtime. Pt verbalized understanding of results. Pt had no questions at this time but was encouraged to call back if questions arise.

## 2017-09-07 ENCOUNTER — Ambulatory Visit: Payer: Self-pay | Admitting: Physician Assistant

## 2017-09-22 ENCOUNTER — Encounter: Payer: Self-pay | Admitting: *Deleted

## 2017-10-03 ENCOUNTER — Other Ambulatory Visit: Payer: Self-pay | Admitting: Physician Assistant

## 2017-10-05 ENCOUNTER — Ambulatory Visit: Payer: Self-pay | Admitting: Adult Health

## 2017-10-05 DIAGNOSIS — Z6841 Body Mass Index (BMI) 40.0 and over, adult: Principal | ICD-10-CM

## 2017-10-05 NOTE — Progress Notes (Signed)
FOLLOW UP  Assessment and Plan:   Hypertension Remains consistently elevated with current medications; bisoprolol 5mg , losartan 100 mg - will add HCTZ - has been on before and tolerated well - start at 12.5 mg daily then increase to 25 mg  Monitor blood pressure at home; patient to call if consistently greater than 130/80 Continue DASH diet.   Reminder to go to the ER if any CP, SOB, nausea, dizziness, severe HA, changes vision/speech, left arm numbness and tingling and jaw pain.  Morbid obesity ([place]) Long discussion about weight loss, diet, and exercise Patient will work on trying keto diet - discussed choosing healthy sources of fat - avoiding cookie aisle - if continue to have problems with increased appetite on wellbutrin may discuss coming off Will follow up in 8 weeks  Adjustment disorder with mixed anxiety and depressed mood She reports she does note benefit of added wellbutrin; she is managing an incredible amount of stress- discussed at length -she feels she is able to manage this stress and will improve after this busy Christmas season. She would like to remain on current prescriptions.  Stress management techniques discussed, increase water, good sleep hygiene discussed, increase exercise, and increase veggies.   Pain of bilateral hands Has been evaluated for wide spread pain by rheumatology - reportedly negative- questionable diagnosis of fibromyalgia She has tried mobic in the past for pain which helped for 1 week - currently using large doses of both aleve and tylenol - discussed risks, discussed a topical agent may be beneficial for small joints - she is very willing to try this. Discussed RICE, avoiding unnecessary repetitive movements.   Continue diet and meds as discussed. Further disposition pending results of labs. Discussed med's effects and SE's.   Over 30 minutes of exam, counseling, chart review, and critical decision making was performed.   Future Appointments   Date Time Provider [place]  11/30/2017 11:00 AM Vicie Mutters, PA-C [id number] None    ----------------------------------------------------------------------------------------------------------------------  HPI 61 y.o. female  presents for 2 month follow up on hypertension, cholesterol, prediabetes, morbid obesity, mood and vitamin D deficiency. She is s/p gastric bypass surgery in 2006 though has gained weight back; she was referred to Dr. Brett Fairy for eval for OSA at the last visit but was negative. The patient has ongoing anxiety and is being monitored closely; wellbutrin 150 mg was added at the last visit to previous treatments of cymbalta 60 mg; she is also prescribed valium 1-2 mg BID PRN. 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 She reports wellbutrin did seem to improve "pep in my step" - but she is concerned it may be increasing her appetite as she has found herself snacking a lot more than usual for her. Discussed methods of avoiding unnecessary snacking -    She also endorses ongoing widespread pain; she has a dx of fibromyalgia; she reports pain is worst in her hands - she is constantly doing fine detailed work with her hands and has been unable to rest.   BMI is Body mass index is 46.94 kg/m., she has not been working on diet and exercise. Wt Readings from Last 3 Encounters:  10/06/17 265 lb (120.2 kg)  08/09/17 254 lb 9.6 oz (115.5 kg)  07/13/17 264 lb 12.8 oz (120.1 kg)   Her blood pressure has not been controlled at home - still running 140s/80s at home, today their BP is BP:  138/90  She does not workout - but works long hours moving constantly on her feet.  She denies chest pain, shortness of breath, dizziness.   Current Medications:  Current Outpatient Medications on File Prior to Visit  Medication Sig  . amitriptyline (ELAVIL) 50 MG tablet TAKE 1-2 TABLETS DAILY AT BEDTIME AS  NEEDED FOR SLEEP  . bisoprolol (ZEBETA) 5 MG tablet Take 1 tablet (5 mg total) by mouth daily.  Marland Kitchen buPROPion (WELLBUTRIN XL) 150 MG 24 hr tablet Take 1 tablet (150 mg total) by mouth every morning.  . Calcium Carbonate-Vit D-Min (CALCIUM 1200 PO) Take by mouth daily.  . diazepam (VALIUM) 2 MG tablet 1/2-1 tablet as needed up to 2 x daily for anxiety or sleep  . DULoxetine (CYMBALTA) 60 MG capsule Take 1 capsule (60 mg total) by mouth daily.  Marland Kitchen losartan (COZAAR) 100 MG tablet Take 1 tablet (100 mg total) by mouth daily.  . Multiple Vitamins-Minerals (MULTIVITAMIN WITH MINERALS) tablet Take 1 tablet by mouth daily.   No current facility-administered medications on file prior to visit.      Allergies:  Allergies  Allergen Reactions  . E-Mycin [Erythromycin]     GI upset     Medical History:  Past Medical History:  Diagnosis Date  . Adjustment disorder with mixed anxiety and depressed mood 11/25/2013  . Fibromyalgia   . H/O gastric bypass   . Mixed hyperlipidemia 11/25/2013   Family history- Reviewed and unchanged Social history- Reviewed and unchanged   Review of Systems:  Review of Systems  Constitutional: Negative for malaise/fatigue and weight loss.  HENT: Negative for hearing loss and tinnitus.   Eyes: Negative for blurred vision and double vision.  Respiratory: Negative for cough, shortness of breath and wheezing.   Cardiovascular: Negative for chest pain, palpitations, orthopnea, claudication and leg swelling.  Gastrointestinal: Negative for abdominal pain, blood in stool, constipation, diarrhea, heartburn, melena, nausea and vomiting.  Genitourinary: Negative.   Musculoskeletal: Positive for joint pain. Negative for myalgias.  Skin: Negative for rash.  Neurological: Negative for dizziness, tingling, sensory change, weakness and headaches.  Endo/Heme/Allergies: Negative for polydipsia.  Psychiatric/Behavioral: Negative for depression, substance abuse and suicidal ideas. The  patient is nervous/anxious and has insomnia.   All other systems reviewed and are negative.     Physical Exam: BP 138/90   Pulse 68   Temp (!) 97.5 F (36.4 C)   Ht 5\' 3"  (1.6 m)   Wt 265 lb (120.2 kg)   SpO2 96%   BMI 46.94 kg/m  Wt Readings from Last 3 Encounters:  10/06/17 265 lb (120.2 kg)  08/09/17 254 lb 9.6 oz (115.5 kg)  07/13/17 264 lb 12.8 oz (120.1 kg)   General Appearance: Well nourished, morbidly obese,  in no acute distress. Eyes: PERRLA, EOMs, conjunctiva no swelling or erythema Sinuses: No Frontal/maxillary tenderness ENT/Mouth: Ext aud canals clear, TMs without erythema, bulging. No erythema, swelling, or exudate on post pharynx.  Tonsils not swollen or erythematous. Hearing normal.  Neck: Supple, thyroid normal.  Respiratory: Respiratory effort normal, BS equal bilaterally without rales, rhonchi, wheezing or stridor.  Cardio: RRR with no MRGs. Brisk peripheral pulses without edema.  Abdomen: Soft, + BS.  Non tender, no guarding, rebound, hernias, masses. Lymphatics: Non tender without lymphadenopathy.  Musculoskeletal: Full ROM, 5/5 strength, Normal gait Skin: Warm, dry without rashes, lesions, ecchymosis.  Neuro: Cranial nerves intact. No cerebellar symptoms.  Psych: Awake and oriented X 3, tearful as discussing stressors, smiles at end of visit,  normal speech and thought pattern, Insight and Judgment appropriate. Denies SI.    Izora Ribas, NP 12:29 PM [facility]

## 2017-10-06 ENCOUNTER — Ambulatory Visit (INDEPENDENT_AMBULATORY_CARE_PROVIDER_SITE_OTHER): Payer: BLUE CROSS/BLUE SHIELD | Admitting: Adult Health

## 2017-10-06 ENCOUNTER — Encounter: Payer: Self-pay | Admitting: Adult Health

## 2017-10-06 VITALS — BP 138/90 | HR 68 | Temp 97.5°F | Ht 63.0 in | Wt 265.0 lb

## 2017-10-06 DIAGNOSIS — M79641 Pain in right hand: Secondary | ICD-10-CM | POA: Diagnosis not present

## 2017-10-06 DIAGNOSIS — Z6841 Body Mass Index (BMI) 40.0 and over, adult: Secondary | ICD-10-CM | POA: Diagnosis not present

## 2017-10-06 DIAGNOSIS — F4323 Adjustment disorder with mixed anxiety and depressed mood: Secondary | ICD-10-CM | POA: Diagnosis not present

## 2017-10-06 DIAGNOSIS — M79642 Pain in left hand: Secondary | ICD-10-CM

## 2017-10-06 DIAGNOSIS — I10 Essential (primary) hypertension: Secondary | ICD-10-CM | POA: Diagnosis not present

## 2017-10-06 MED ORDER — HYDROCHLOROTHIAZIDE 25 MG PO TABS: ORAL_TABLET | ORAL | 3 refills | Status: DC

## 2017-10-06 MED ORDER — DICLOFENAC SODIUM 1 % TD GEL: 4.0000 g | Freq: Four times a day (QID) | TRANSDERMAL | 3 refills | Status: DC

## 2017-10-06 NOTE — Patient Instructions (Signed)
Serotonin Syndrome Serotonin is a brain chemical that regulates the nervous system, which includes the brain, spinal cord, and nerves. Serotonin appears to play a role in all types of behavior, including appetite, emotions, movement, thinking, and response to stress. Excessively high levels of serotonin in the body can cause serotonin syndrome, which is a very dangerous condition. What are the causes? This condition can be caused by taking medicines or drugs that increase the level of serotonin in your body. These include:  Antidepressant medicines.  Migraine medicines.  Certain pain medicines.  Certain recreational drugs, including ecstasy, LSD, cocaine, and amphetamines.  Over-the-counter cough or cold medicines that contain dextromethorphan.  Certain herbal supplements, including St. John's wort, ginseng, and nutmeg.  This condition usually occurs when you take these medicines or drugs in combination, but it can also happen with a high dose of a single medicine or drug. What increases the risk? This condition is more likely to develop in:  People who have recently increased the dosage of medicine that increases the serotonin level.  People who just started taking medicine that increases the serotonin level.  What are the signs or symptoms? Symptoms of this condition usually happens within several hours of a medicine change. Symptoms include:  Headache.  Muscle twitching or stiffness.  Diarrhea.  Confusion.  Restlessness or agitation.  Shivering or goose bumps.  Loss of muscle coordination.  Rapid heart rate.  Sweating.  Severe cases of serotonin syndromecan cause:  Irregular heartbeat.  Seizures.  Loss of consciousness.  High fever.  How is this diagnosed? This condition is diagnosed with a medical history and physical exam. You will be asked aboutyour symptoms and your use of medicines and recreational drugs. Your health care provider may also order lab  work or additional tests to rule out other causes of your symptoms. How is this treated? The treatment for this condition depends on the severity of your symptoms. For mild cases, stopping the medicine that caused your condition is usually all that is needed. For moderate to severe cases, hospitalization is required to monitor you and to prevent further muscle damage. Follow these instructions at home:  Take over-the-counter and prescription medicines only as told by your health care provider. This is important.  Check with your health care provider before you start taking any new prescriptions, over-the-counter medicines, herbs, or supplements.  Avoid combining any medicines that can cause this condition to occur.  Keep all follow-up visits as told by your health care provider.This is important.  Maintain a healthy lifestyle. ? Eat healthy foods. ? Get plenty of sleep. ? Exercise regularly. ? Do not drink alcohol. ? Do not use recreational drugs. Contact a health care provider if:  Medicines do not seem to be helping.  Your symptoms do not improve or they get worse.  You have trouble taking care of yourself. Get help right away if:  You have worsening confusion, severe headache, chest pain, high fever, seizures, or loss of consciousness.  You have serious thoughts about hurting yourself or others.  You experience serious side effects of medicine, such as swelling of your face, lips, tongue, or throat. This information is not intended to replace advice given to you by your health care provider. Make sure you discuss any questions you have with your health care provider. Document Released: 11/11/2004 Document Revised: 05/29/2016 Document Reviewed: 10/17/2014 Elsevier Interactive Patient Education  2018 [employer]  

## 2017-10-30 ENCOUNTER — Other Ambulatory Visit: Payer: Self-pay | Admitting: Physician Assistant

## 2017-11-15 ENCOUNTER — Other Ambulatory Visit: Payer: Self-pay | Admitting: Internal Medicine

## 2017-11-15 ENCOUNTER — Other Ambulatory Visit: Payer: Self-pay | Admitting: Physician Assistant

## 2017-11-15 DIAGNOSIS — I10 Essential (primary) hypertension: Secondary | ICD-10-CM

## 2017-11-15 MED ORDER — ATENOLOL 50 MG PO TABS: ORAL_TABLET | ORAL | 1 refills | Status: DC

## 2017-11-29 NOTE — Progress Notes (Signed)
Subjective:    Patient ID: Nicole Padilla, female    DOB: [birth year], 62 y.o.   MRN: [id number]  HPI Patient is here for 4 week follow up for BP and mood.  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 Has had negative rheum work up. Wants to see hand specialist, wearing splints at night.  Negative sleep study in Nov. Her BP looks good, she is on atenolol, losartan and HCTZ 25mg .   BMI is Body mass index is 45.06 kg/m., she is working on diet and exercise. Wt Readings from Last 3 Encounters:  11/30/17 254 lb 6.4 oz (115.4 kg)  10/06/17 265 lb (120.2 kg)  08/09/17 254 lb 9.6 oz (115.5 kg)    Blood pressure 132/70, pulse 67, temperature 97.7 F (36.5 C), resp. rate 16, height 5\' 3"  (1.6 m), weight 254 lb 6.4 oz (115.4 kg), SpO2 96 %.  Medications Current Outpatient Medications on File Prior to Visit  Medication Sig  . amitriptyline (ELAVIL) 50 MG tablet TAKE 1-2 TABLETS DAILY AT BEDTIME AS NEEDED FOR SLEEP  . atenolol (TENORMIN) 50 MG tablet Take 1 tablet daily for BP  . Calcium Carbonate-Vit D-Min (CALCIUM 1200 PO) Take by mouth daily.  . diazepam (VALIUM) 2 MG tablet 1/2-1 tablet as needed up to 2 x daily for anxiety or sleep  . diclofenac sodium (VOLTAREN) 1 % GEL Apply 4 g topically 4 (four) times daily.  . DULoxetine (CYMBALTA) 60 MG capsule Take 1 capsule (60 mg total) by mouth daily.  . hydrochlorothiazide (HYDRODIURIL) 25 MG tablet Take 1/2 - 1 tab by mouth daily  . losartan (COZAAR) 100 MG tablet Take 1 tablet (100 mg total) by mouth daily.  . Multiple Vitamins-Minerals (MULTIVITAMIN WITH MINERALS) tablet Take 1 tablet by mouth daily.   No current facility-administered medications on file prior to visit.     Problem list She has Fibromyalgia; H/O gastric bypass; Mixed hyperlipidemia; Adjustment disorder with mixed anxiety and depressed mood; Prediabetes; Essential hypertension, benign; Medication management;  Vitamin D deficiency; and Morbid obesity with body mass index (BMI) of 40.0 to 44.9 in adult [facility]) on their problem list.  Review of Systems  Constitutional: Positive for fatigue.  HENT: Negative.   Respiratory: Negative.   Cardiovascular: Negative.   Gastrointestinal: Negative.   Genitourinary: Negative.   Musculoskeletal: Negative.   Skin: Negative.   Neurological: Negative.   Hematological: Negative.   Psychiatric/Behavioral: Positive for sleep disturbance. The patient is nervous/anxious.        Objective:   Physical Exam  Constitutional: She is oriented to person, place, and time. She appears well-developed and well-nourished.  HENT:  Head: Normocephalic and atraumatic.  Right Ear: External ear normal.  Left Ear: External ear normal.  Mouth/Throat: Oropharynx is clear and moist.  Eyes: Conjunctivae and EOM are normal. Pupils are equal, round, and reactive to light.  Neck: Normal range of motion. Neck supple. No thyromegaly present.  Cardiovascular: Normal rate, regular rhythm and normal heart sounds. Exam reveals no gallop and no friction rub.  No murmur heard. Pulmonary/Chest: Effort normal and breath sounds normal. No respiratory distress. She has no wheezes.  Abdominal: Soft. Bowel sounds are normal. She exhibits no distension and no mass. There is no tenderness. There is no rebound and no guarding.  Musculoskeletal: Normal range of motion.  Lymphadenopathy:    She has no cervical adenopathy.  Neurological: She is alert and oriented to person,  place, and time. She displays normal reflexes. No cranial nerve deficit. Coordination normal.  Skin: Skin is warm and dry.  Psychiatric: She has a normal mood and affect.       Assessment & Plan:    Essential hypertension, benign - continue medications, increase losartan to 100mg , continue weight loss, DASH diet, exercise and monitor at home. Call if greater than 130/80.  -    Check kidney function - continue medications, BP  at [place]  Anxiety/depression continue wellbutrin  -     buPROPion (WELLBUTRIN XL) 150 MG 24 hr tablet; Take 1 tablet (150 mg total) by mouth every morning.  Morbid Obesity with co morbidities - long discussion about weight loss, diet, and exercise   3 MONTH FOLLOW UP

## 2017-11-30 ENCOUNTER — Ambulatory Visit: Payer: BLUE CROSS/BLUE SHIELD | Admitting: Physician Assistant

## 2017-11-30 ENCOUNTER — Other Ambulatory Visit: Payer: Self-pay

## 2017-11-30 ENCOUNTER — Encounter: Payer: Self-pay | Admitting: Physician Assistant

## 2017-11-30 VITALS — BP 132/70 | HR 67 | Temp 97.7°F | Resp 16 | Ht 63.0 in | Wt 254.4 lb

## 2017-11-30 DIAGNOSIS — Z6841 Body Mass Index (BMI) 40.0 and over, adult: Secondary | ICD-10-CM | POA: Diagnosis not present

## 2017-11-30 DIAGNOSIS — E782 Mixed hyperlipidemia: Secondary | ICD-10-CM

## 2017-11-30 DIAGNOSIS — M797 Fibromyalgia: Secondary | ICD-10-CM

## 2017-11-30 DIAGNOSIS — F4323 Adjustment disorder with mixed anxiety and depressed mood: Secondary | ICD-10-CM | POA: Diagnosis not present

## 2017-11-30 DIAGNOSIS — I10 Essential (primary) hypertension: Secondary | ICD-10-CM

## 2017-11-30 LAB — BASIC METABOLIC PANEL WITH GFR
BUN: 16 mg/dL (ref 7–25)
CALCIUM: 9.4 mg/dL (ref 8.6–10.4)
CO2: 29 mmol/L (ref 20–32)
CREATININE: 0.76 mg/dL (ref 0.50–0.99)
Chloride: 104 mmol/L (ref 98–110)
GFR, Est African American: 97 mL/min/{1.73_m2} (ref >=60)
GFR, Est Non African American: 84 mL/min/{1.73_m2} (ref >=60)
GLUCOSE: 96 mg/dL (ref 65–99)
Potassium: 5.2 mmol/L (ref 3.5–5.3)
Sodium: 141 mmol/L (ref 135–146)

## 2017-11-30 LAB — CBC WITH DIFFERENTIAL/PLATELET
BASOS PCT: 0.5 %
Basophils Absolute: 48 cells/uL (ref 0–200)
EOS ABS: 67 {cells}/uL (ref 15–500)
Eosinophils Relative: 0.7 %
HCT: 41 % (ref 35.0–45.0)
HEMOGLOBIN: 13.5 g/dL (ref 11.7–15.5)
Lymphs Abs: 2242 cells/uL (ref 850–3900)
MCH: 27.7 pg (ref 27.0–33.0)
MCHC: 32.9 g/dL (ref 32.0–36.0)
MCV: 84 fL (ref 80.0–100.0)
MONOS PCT: 5.7 %
MPV: 11.5 fL (ref 7.5–12.5)
NEUTROS ABS: 6603 {cells}/uL (ref 1500–7800)
Neutrophils Relative %: 69.5 %
Platelets: 314 10*3/uL (ref 140–400)
RBC: 4.88 10*6/uL (ref 3.80–5.10)
RDW: 13.6 % (ref 11.0–15.0)
Total Lymphocyte: 23.6 %
WBC: 9.5 10*3/uL (ref 3.8–10.8)
WBCMIX: 542 {cells}/uL (ref 200–950)

## 2017-11-30 LAB — HEPATIC FUNCTION PANEL
AG Ratio: 1.4 (calc) (ref 1.0–2.5)
ALBUMIN MSPROF: 4.2 g/dL (ref 3.6–5.1)
ALT: 16 U/L (ref 6–29)
AST: 17 U/L (ref 10–35)
Alkaline phosphatase (APISO): 65 U/L (ref 33–130)
BILIRUBIN INDIRECT: 0.3 mg/dL (ref 0.2–1.2)
Bilirubin, Direct: 0.1 mg/dL (ref 0.0–0.2)
GLOBULIN: 3 g/dL (ref 1.9–3.7)
TOTAL PROTEIN: 7.2 g/dL (ref 6.1–8.1)
Total Bilirubin: 0.4 mg/dL (ref 0.2–1.2)

## 2017-11-30 MED ORDER — BUPROPION HCL ER (XL) 150 MG PO TB24: 150.0000 mg | ORAL_TABLET | ORAL | 2 refills | Status: DC

## 2017-11-30 NOTE — Patient Instructions (Addendum)
Mini Habits for weight loss- please get and read this book.   Being dehydrated can hurt your kidneys, cause fatigue, headaches, muscle aches, joint pain, and dry skin/nails so please increase your fluids.   Drink 80-100 oz a day of water, measure it out! Eat 3 meals a day, have to do breakfast, eat protein- hard boiled eggs, protein bar like [place] protein bar, greek yogurt like oikos triple zero, chobani 100, or light n fit greek  Can check out plantnanny app on your phone to help you keep track of your water  Diet soda leads to weight gain.  We recently discovered that the artifical sugar in the soda stops an enzyme in your stomach that is suppose to signal that your brain is full. So patients that drink a lot of diet soda will never feel full and tend to over eat. So please cut back on diet soda and it can help with your weight loss.   Intermittent fasting is more about strategy than starvation. It's meant to reset your body in different ways, hopefully with fitness and nutrition changes as a result.  Like any big switchover, though, results may vary when it comes down to the individual level. What works for your friends may not work for you, or vice versa. That's why it's helpful to play around with variations on intermittent fasting and healthy habits and find what works best for you.  WHAT IS INTERMITTENT FASTING AND WHY DO IT?  Intermittent fasting doesn't involve specific foods, but rather, a strict schedule regarding when you eat. Also called "time-restricted eating," the tactic has been praised for its contribution to weight loss, improved body composition, and decreased cravings. Preliminary research also suggests it may be beneficial for glucose tolerance, hormone regulation, better muscle mass and lower body fat.  Part of its appeal is the simplicity of the effort. Unlike some other trends, there's no calculations to intermittent fasting.  You simply eat within a certain  block of time, usually a window of 8-10 hours. In the other big block of time - about 14-16 hours, including when you're asleep - you don't eat anything, not even snacks. You can drink water, coffee, tea or any other beverage that doesn't have calories.  For example, if you like having a late dinner, you might skip breakfast and have your first meal at noon and your last meal of the day at 8 p.m., and then not eat until noon again the next day.  IDEAS FOR GETTING STARTED  If you're new to the strategy, it may be helpful to eat within the typical circadian rhythm and keep eating within daylight hours. This can be especially beneficial if you're looking at intermittent fasting for weight-loss goals.  So first try only eating between 12pm to 8pm.  Outside of this time you may have water, black coffee, and hot tea. You may not eat it drink anything that has carbs, sugars, OR artificial sugars like diet soda.   Like any major eating and fitness shift, it can take time to find the perfect fit, so don't be afraid to experiment with different options - including ditching intermittent fasting altogether if it's simply not for you. But if it is, you may be surprised by some of the benefits that come along with the strategy.  Are you an emotional eater? Do you eat more when you're feeling stressed? Do you eat when you're not hungry or when you're full? Do you eat to feel better (to  calm and soothe yourself when you're sad, mad, bored, anxious, etc.)? Do you reward yourself with food? Do you regularly eat until you've stuffed yourself? Does food make you feel safe? Do you feel like food is a friend? Do you feel powerless or out of control around food?  If you answered yes to some of these questions than it is likely that you are an emotional eater. This is normally a learned behavior and can take time to first recognize the signs and second BREAK THE HABIT. But here is more information and tips to help.    The difference between emotional hunger and physical hunger Emotional hunger can be powerful, so it's easy to mistake it for physical hunger. But there are clues you can look for to help you tell physical and emotional hunger apart.  Emotional hunger comes on suddenly. It hits you in an instant and feels overwhelming and urgent. Physical hunger, on the other hand, comes on more gradually. The urge to eat doesn't feel as dire or demand instant satisfaction (unless you haven't eaten for a very long time).  Emotional hunger craves specific comfort foods. When you're physically hungry, almost anything sounds good-including healthy stuff like vegetables. But emotional hunger craves junk food or sugary snacks that provide an instant rush. You feel like you need cheesecake or pizza, and nothing else will do.  Emotional hunger often leads to mindless eating. Before you know it, you've eaten a whole bag of chips or an entire pint of ice cream without really paying attention or fully enjoying it. When you're eating in response to physical hunger, you're typically more aware of what you're doing.  Emotional hunger isn't satisfied once you're full. You keep wanting more and more, often eating until you're uncomfortably stuffed. Physical hunger, on the other hand, doesn't need to be stuffed. You feel satisfied when your stomach is full.  Emotional hunger isn't located in the stomach. Rather than a growling belly or a pang in your stomach, you feel your hunger as a craving you can't get out of your head. You're focused on specific textures, tastes, and smells.  Emotional hunger often leads to regret, guilt, or shame. When you eat to satisfy physical hunger, you're unlikely to feel guilty or ashamed because you're simply giving your body what it needs. If you feel guilty after you eat, it's likely because you know deep down that you're not eating for nutritional reasons.  Identify your emotional eating  triggers What situations, places, or feelings make you reach for the comfort of food? Most emotional eating is linked to unpleasant feelings, but it can also be triggered by positive emotions, such as rewarding yourself for achieving a goal or celebrating a holiday or happy event. Common causes of emotional eating include:  Stuffing emotions - Eating can be a way to temporarily silence or "stuff down" uncomfortable emotions, including anger, fear, sadness, anxiety, loneliness, resentment, and shame. While you're numbing yourself with food, you can avoid the difficult emotions you'd rather not feel.  Boredom or feelings of emptiness - Do you ever eat simply to give yourself something to do, to relieve boredom, or as a way to fill a void in your life? You feel unfulfilled and empty, and food is a way to occupy your mouth and your time. In the moment, it fills you up and distracts you from underlying feelings of purposelessness and dissatisfaction with your life.  Childhood habits - Think back to your childhood memories of food. Did  your parents reward good behavior with ice cream, take you out for pizza when you got a good report card, or serve you sweets when you were feeling sad? These habits can often carry over into adulthood. Or your eating may be driven by nostalgia-for cherished memories of grilling burgers in the backyard with your dad or baking and eating cookies with your mom.  Social influences - Getting together with other people for a meal is a great way to relieve stress, but it can also lead to overeating. It's easy to overindulge simply because the food is there or because everyone else is eating. You may also overeat in social situations out of nervousness. Or perhaps your family or circle of friends encourages you to overeat, and it's easier to go along with the group.  Stress - Ever notice how stress makes you hungry? It's not just in your mind. When stress is chronic, as it so often is  in our chaotic, fast-paced world, your body produces high levels of the stress hormone, cortisol. Cortisol triggers cravings for salty, sweet, and fried foods-foods that give you a burst of energy and pleasure. The more uncontrolled stress in your life, the more likely you are to turn to food for emotional relief.  Find other ways to feed your feelings If you don't know how to manage your emotions in a way that doesn't involve food, you won't be able to control your eating habits for very long. Diets so often fail because they offer logical nutritional advice which only works if you have conscious control over your eating habits. It doesn't work when emotions hijack the process, demanding an immediate payoff with food.  In order to stop emotional eating, you have to find other ways to fulfill yourself emotionally. It's not enough to understand the cycle of emotional eating or even to understand your triggers, although that's a huge first step. You need alternatives to food that you can turn to for emotional fulfillment.  Alternatives to emotional eating If you're depressed or lonely, call someone who always makes you feel better, play with your dog or cat, or look at a favorite photo or cherished memento.  If you're anxious, expend your nervous energy by dancing to your favorite song, squeezing a stress ball, or taking a brisk walk.  If you're exhausted, treat yourself with a hot cup of tea, take a bath, light some scented candles, or wrap yourself in a warm blanket.  If you're bored, read a good book, watch a comedy show, explore the outdoors, or turn to an activity you enjoy (woodworking, playing the guitar, shooting hoops, scrapbooking, etc.).  What is mindful eating? Mindful eating is a practice that develops your awareness of eating habits and allows you to pause between your triggers and your actions. Most emotional eaters feel powerless over their food cravings. When the urge to eat hits, you  feel an almost unbearable tension that demands to be fed, right now. Because you've tried to resist in the past and failed, you believe that your willpower just isn't up to snuff. But the truth is that you have more power over your cravings than you think.  Take 5 before you give in to a craving Emotional eating tends to be automatic and virtually mindless. Before you even realize what you're doing, you've reached for a tub of ice cream and polished off half of it. But if you can take a moment to pause and reflect when you're hit with a craving,  you give yourself the opportunity to make a different decision.  Can you put off eating for five minutes? Or just start with one minute. Don't tell yourself you can't give in to the craving; remember, the forbidden is extremely tempting. Just tell yourself to wait.  While you're waiting, check in with yourself. How are you feeling? What's going on emotionally? Even if you end up eating, you'll have a better understanding of why you did it. This can help you set yourself up for a different response next time.  How to practice mindful eating Eating while you're also doing other things-such as watching TV, driving, or playing with your phone-can prevent you from fully enjoying your food. Since your mind is elsewhere, you may not feel satisfied or continue eating even though you're no longer hungry. Eating more mindfully can help focus your mind on your food and the pleasure of a meal and curb overeating.   Eat your meals in a calm place with no distractions, aside from any dining companions.  Try eating with your non-dominant hand or using chopsticks instead of a knife and fork. Eating in such a non-familiar way can slow down how fast you eat and ensure your mind stays focused on your food.  Allow yourself enough time not to have to rush your meal. Set a timer for 20 minutes and pace yourself so you spend at least that much time eating.  Take small bites and  chew them well, taking time to notice the different flavors and textures of each mouthful.  Put your utensils down between bites. Take time to consider how you feel-hungry, satiated-before picking up your utensils again.  Try to stop eating before you are full.It takes time for the signal to reach your brain that you've had enough. Don't feel obligated to always clean your plate.  When you've finished your food, take a few moments to assess if you're really still hungry before opting for an extra serving or dessert.  Learn to accept your feelings-even the bad ones  While it may seem that the core problem is that you're powerless over food, emotional eating actually stems from feeling powerless over your emotions. You don't feel capable of dealing with your feelings head on, so you avoid them with food.  Recommended reading   Healthy Eating: A guide to the new nutrition - [employer] Report  10 Tips for Mindful Eating - How mindfulness can help you fully enjoy a meal and the experience of eating-with moderation and restraint. ([facility])  Weight Loss: Gain Control of Emotional Eating - Tips to regain control of your eating habits. [facility])  Why Stress Causes People to Overeat -Tips on controlling stress eating. ([place])  Mindful Eating Meditations -Free online mindfulness meditations. ([facility])    Veggies are great because you can eat a ton! They are low in calories, great to fill you up, and have a ton of vitamins, minerals, and protein.

## 2017-12-04 ENCOUNTER — Encounter: Payer: Self-pay | Admitting: Physician Assistant

## 2017-12-05 MED ORDER — TRAMADOL HCL 50 MG PO TABS
50.0000 mg | ORAL_TABLET | Freq: Four times a day (QID) | ORAL | 0 refills | Status: DC | PRN
Start: 2017-12-05 — End: 2018-11-22

## 2018-02-15 NOTE — Progress Notes (Signed)
Assessment and Plan:   Essential hypertension, benign - continue medications, DASH diet, exercise and monitor at home. Call if greater than 130/80.  - Go to the ER if any CP, SOB, nausea, dizziness, severe HA, changes vision/speech -     CBC with Differential/Platelet -     BASIC METABOLIC PANEL WITH GFR -     Hepatic function panel -     TSH  Fibromyalgia Continue  cymbalta, start pool exercises, get back on amitriptyline at night  Mixed hyperlipidemia -continue medications, check lipids, decrease fatty foods, increase activity.  -     Lipid panel  Adjustment disorder with mixed anxiety and depressed mood Continue medications, doing well  Prediabetes -     Hemoglobin A1c  Medication management -     Magnesium   Continue diet and meds as discussed. Further disposition pending results of labs.  HPI  62 y.o. female  presents for 3 month follow up with hypertension, hyperlipidemia, prediabetes and vitamin D.  She has FM and depression, has had negative autoimmune work up and has seen rheum, she is on cymbalta and wellbutrin and doing well. She is on valium 1-2 x a month.    Her blood pressure has been controlled at home, today their BP is BP: 122/68   She does not workout. She denies chest pain, shortness of breath, dizziness.    She is on cholesterol medication and denies myalgias. Her cholesterol is at goal. The cholesterol last visit was:   Lab Results  Component Value Date   CHOL 189 05/05/2017   HDL 69 05/05/2017   LDLCALC 106 (H) 05/05/2017   TRIG 71 05/05/2017   CHOLHDL 2.7 05/05/2017    She has not been working on diet and exercise for prediabetes, and denies foot ulcerations, hyperglycemia, hypoglycemia , increased appetite, nausea, paresthesia of the feet, polydipsia, polyuria, visual disturbances, vomiting and weight loss. Last A1C in the office was:  Lab Results  Component Value Date   HGBA1C 5.7 (H) 05/05/2017   Patient is on Vitamin D supplement.  Lab  Results  Component Value Date   VD25OH 50 04/14/2015     BMI is Body mass index is 46.23 kg/m., she is working on diet and exercise. She was given information about mini habits, emotional eating, and time She has biggest show next week and hoping that after she will be able.  Wt Readings from Last 3 Encounters:  02/16/18 261 lb (118.4 kg)  11/30/17 254 lb 6.4 oz (115.4 kg)  10/06/17 265 lb (120.2 kg)     Current Medications:  Current Outpatient Medications on File Prior to Visit  Medication Sig Dispense Refill  . amitriptyline (ELAVIL) 50 MG tablet TAKE 1-2 TABLETS DAILY AT BEDTIME AS NEEDED FOR SLEEP 60 tablet 1  . atenolol (TENORMIN) 50 MG tablet Take 1 tablet daily for BP 90 tablet 1  . buPROPion (WELLBUTRIN XL) 150 MG 24 hr tablet Take 1 tablet (150 mg total) by mouth every morning. 90 tablet 2  . Calcium Carbonate-Vit D-Min (CALCIUM 1200 PO) Take by mouth daily.    . diazepam (VALIUM) 2 MG tablet 1/2-1 tablet as needed up to 2 x daily for anxiety or sleep 30 tablet 0  . diclofenac sodium (VOLTAREN) 1 % GEL Apply 4 g topically 4 (four) times daily. 100 g 3  . DULoxetine (CYMBALTA) 60 MG capsule Take 1 capsule (60 mg total) by mouth daily. 90 capsule 1  . hydrochlorothiazide (HYDRODIURIL) 25 MG tablet Take 1/2 -  1 tab by mouth daily 90 tablet 3  . Multiple Vitamins-Minerals (MULTIVITAMIN WITH MINERALS) tablet Take 1 tablet by mouth daily.    . traMADol (ULTRAM) 50 MG tablet Take 1 tablet (50 mg total) by mouth 4 (four) times daily as needed for severe pain. 20 tablet 0   No current facility-administered medications on file prior to visit.     Medical History:  Past Medical History:  Diagnosis Date  . Adjustment disorder with mixed anxiety and depressed mood 11/25/2013  . Fibromyalgia   . H/O gastric bypass   . Mixed hyperlipidemia 11/25/2013    Allergies:  Allergies  Allergen Reactions  . E-Mycin [Erythromycin]     GI upset     Review of Systems:  Review of Systems   Constitutional: Negative for chills, fever and malaise/fatigue.  HENT: Negative for congestion, ear pain and sore throat.   Eyes: Negative.   Respiratory: Negative for cough, shortness of breath and wheezing.   Cardiovascular: Negative for chest pain, palpitations and leg swelling.  Gastrointestinal: Negative for abdominal pain, blood in stool, constipation, diarrhea, heartburn and melena.  Genitourinary: Negative.   Musculoskeletal: Negative for joint pain.  Skin: Negative for rash.  Neurological: Negative for dizziness, sensory change, loss of consciousness and headaches.  Psychiatric/Behavioral: Positive for depression. The patient is nervous/anxious and has insomnia.     Family history- Review and unchanged  Social history- Review and unchanged  Physical Exam: BP 122/68   Pulse 64   Temp 97.7 F (36.5 C)   Resp 14   Ht 5\' 3"  (1.6 m)   Wt 261 lb (118.4 kg)   SpO2 95%   BMI 46.23 kg/m  Wt Readings from Last 3 Encounters:  02/16/18 261 lb (118.4 kg)  11/30/17 254 lb 6.4 oz (115.4 kg)  10/06/17 265 lb (120.2 kg)    General Appearance: Well nourished well developed, in no apparent distress. Eyes: PERRLA, EOMs, conjunctiva no swelling or erythema ENT/Mouth: Ear canals normal without obstruction, swelling, erythma, discharge.  TMs normal bilaterally.  Oropharynx moist, clear, without exudate, or postoropharyngeal swelling. Neck: Supple, thyroid normal,no cervical adenopathy  Respiratory: Respiratory effort normal, Breath sounds clear A&P without rhonchi, wheeze, or rale.  No retractions, no accessory usage. Cardio: RRR with no MRGs. Brisk peripheral pulses without edema.  Abdomen: Soft, + BS,  Non tender, no guarding, rebound, hernias, masses. Musculoskeletal: Full ROM, 5/5 strength, Normal gait,  Skin: Warm, dry without rashes, lesions, ecchymosis.  Neuro: Awake and oriented X 3, Cranial nerves intact. Normal muscle tone, no cerebellar symptoms. Psych: Normal affect,  Insight and Judgment appropriate.    Vicie Mutters, PA-C 11:02 AM [facility]

## 2018-02-16 ENCOUNTER — Encounter: Payer: Self-pay | Admitting: Physician Assistant

## 2018-02-16 ENCOUNTER — Ambulatory Visit: Payer: BLUE CROSS/BLUE SHIELD | Admitting: Physician Assistant

## 2018-02-16 ENCOUNTER — Other Ambulatory Visit: Payer: Self-pay | Admitting: Physician Assistant

## 2018-02-16 VITALS — BP 122/68 | HR 64 | Temp 97.7°F | Resp 14 | Ht 63.0 in | Wt 261.0 lb

## 2018-02-16 DIAGNOSIS — E782 Mixed hyperlipidemia: Secondary | ICD-10-CM

## 2018-02-16 DIAGNOSIS — Z79899 Other long term (current) drug therapy: Secondary | ICD-10-CM | POA: Diagnosis not present

## 2018-02-16 DIAGNOSIS — R7309 Other abnormal glucose: Secondary | ICD-10-CM

## 2018-02-16 DIAGNOSIS — I10 Essential (primary) hypertension: Secondary | ICD-10-CM

## 2018-02-16 DIAGNOSIS — M797 Fibromyalgia: Secondary | ICD-10-CM

## 2018-02-16 LAB — CBC WITH DIFFERENTIAL/PLATELET
BASOS PCT: 0.6 %
Basophils Absolute: 50 cells/uL (ref 0–200)
Eosinophils Absolute: 109 cells/uL (ref 15–500)
Eosinophils Relative: 1.3 %
HCT: 38.7 % (ref 35.0–45.0)
Hemoglobin: 12.8 g/dL (ref 11.7–15.5)
Lymphs Abs: 1966 cells/uL (ref 850–3900)
MCH: 27 pg (ref 27.0–33.0)
MCHC: 33.1 g/dL (ref 32.0–36.0)
MCV: 81.6 fL (ref 80.0–100.0)
MONOS PCT: 5.3 %
MPV: 12.2 fL (ref 7.5–12.5)
Neutro Abs: 5830 cells/uL (ref 1500–7800)
Neutrophils Relative %: 69.4 %
PLATELETS: 314 10*3/uL (ref 140–400)
RBC: 4.74 10*6/uL (ref 3.80–5.10)
RDW: 13.6 % (ref 11.0–15.0)
TOTAL LYMPHOCYTE: 23.4 %
WBC mixed population: 445 cells/uL (ref 200–950)
WBC: 8.4 10*3/uL (ref 3.8–10.8)

## 2018-02-16 LAB — LIPID PANEL
Cholesterol: 180 mg/dL (ref <200)
HDL: 60 mg/dL (ref >50)
LDL Cholesterol (Calc): 96 mg/dL (calc)
NON-HDL CHOLESTEROL (CALC): 120 mg/dL (ref <130)
TRIGLYCERIDES: 138 mg/dL (ref <150)
Total CHOL/HDL Ratio: 3 (calc) (ref <5.0)

## 2018-02-16 LAB — COMPLETE METABOLIC PANEL WITH GFR
AG Ratio: 1.5 (calc) (ref 1.0–2.5)
ALT: 12 U/L (ref 6–29)
AST: 18 U/L (ref 10–35)
Albumin: 3.9 g/dL (ref 3.6–5.1)
Alkaline phosphatase (APISO): 77 U/L (ref 33–130)
BILIRUBIN TOTAL: 0.3 mg/dL (ref 0.2–1.2)
BUN: 15 mg/dL (ref 7–25)
CALCIUM: 9.2 mg/dL (ref 8.6–10.4)
CHLORIDE: 103 mmol/L (ref 98–110)
CO2: 30 mmol/L (ref 20–32)
Creat: 0.8 mg/dL (ref 0.50–0.99)
GFR, EST AFRICAN AMERICAN: 92 mL/min/{1.73_m2} (ref >=60)
GFR, Est Non African American: 79 mL/min/{1.73_m2} (ref >=60)
GLUCOSE: 87 mg/dL (ref 65–99)
Globulin: 2.6 g/dL (calc) (ref 1.9–3.7)
Potassium: 4.9 mmol/L (ref 3.5–5.3)
Sodium: 139 mmol/L (ref 135–146)
TOTAL PROTEIN: 6.5 g/dL (ref 6.1–8.1)

## 2018-02-16 LAB — TSH: TSH: 1.37 mIU/L (ref 0.40–4.50)

## 2018-02-16 NOTE — Patient Instructions (Signed)
The studies for CBD are better for seizure disorders than any other claims for CBD oil.  The biggest issues with it, is CBD oil is NOT regulated. A recent test of 18 over the counter CBD oil showed that 20% had TOO much, 60 % did not have the claimed amount, and 20 % had the claimed amount.   I have had some patients where it interacts with their medications as well and there is not a way for me to check the interactions since it is not a controlled substance.   With any other the counter medication OR supplement, if you try it, try to get from good source and if you have ANY abnormal symptoms over the next 3 months or don't see any benefits from it stop it.   The quality and quanity of the CBD oil varies greatly so I can not endorse patients taking it at this time.  8 Critical Weight-Loss Tips That Aren't Diet and Exercise  1. STARVE THE DISTRACTIONS  All too often when we eat, we're also multitasking: watching TV, answering emails, scrolling through social media. These habits are detrimental to having a strong, clear, healthy relationship with food, and they can hinder our ability to make dietary changes.  In order to truly focus on what you're eating, how much you're eating, why you're eating those specific foods and, most importantly, how those foods make you feel, you need to starve the distractions. That means when you eat, just eat. Focus on your food, the process it went through to end up on your plate, where it came from and how it nourishes you. With this technique, you're more likely to finish a meal feeling satiated.  2.  CONSIDER WHAT YOU'RE NOT WILLING TO DO  This might sound counterintuitive, but it can help provide a "why" when motivation is waning. Declare, in writing, what you are unwilling to do, for example "I am unwilling to be the old dad who cannot play sports with my children".  So consider what you're not willing to accept, write it down, and keep it at the ready.  3.   STOP LABELING FOOD "GOOD" AND "BAD"  You've probably heard someone say they ate something "bad." Maybe you've even said it yourself.  The trouble with 'bad' foods isn't that they'll send you to the grave after a bite or two. The trouble comes when we eat excessive portions of really calorie-dense foods meal after meal, day after day.  Instead of labeling foods as good or bad, think about which foods you can eat a lot of, and which ones you should just eat a little of. Then, plan ways to eat the foods you really like in portions that fit with your overall goals. A good example of this would be having a slice of pizza alongside a club salad with chicken breast, avocado and a bit of dressing. This is vastly different than 3 slices of pizza, 4 breadsticks with cheese sauce and half of a liter of regular soda.  4.  BRUSH YOUR TEETH AFTER YOU EAT  Getting your mindset in order is important, but sometimes small habits can make a big difference. After eating, you still have the taste of food in their mouth, which often causes people to eat more even if they are full or engage in a nibble or two of dessert.  Brushing your teeth will remove the taste of food from your mouth, and the clean, minty freshness will serve as a cue  that mealtime is over.  5.  FOCUS ON CROWDING NOT CUTTING  The most common first step during 'dieting' is to cut. We cut our portion sizes down, we cut out 'bad' foods, we cut out entire food groups. This act of cutting puts [place] and our minds into scarcity mode.  When something is off-limits, even if you're able to avoid it for a while, you could end up bingeing on it later because you've gone so long without it. So, instead of cutting, focus on crowding. If you crowd your plate and fill it up with more foods like veggies and protein, it simply allows less room for the other stuff. In other words, shift your focus away from what you can't eat, and celebrate the foods that will help you  reach your goals.  6.  TAKE TRACKING A STEP FURTHER  Track what you eat, when you ate it, how much you ate and how that food made you feel. Being completely honest with yourself and writing down every single thing that passes through your lips will help you start to notice that maybe you actually do snack, possibly take in more sugar than you thought, eat when you're bored rather than just hungry or maybe that you have a habit of snacking before bed while watching TV.  The difference from simply tracking your food intake is you're taking into account how food makes you feel, as well as what you're doing while you're eating. This is about becoming more mindful of what, when and why you eat.  7.  PRIORITIZE GOOD SLEEP  One of the strongest risk factors for being overweight is poor sleep. When you're feeling tired, you're more likely to choose unhealthy comfort foods and to skip your workout. Additionally, sleep deprivation may slow down your metabolism. [employer]! Therefore, sleeping 7-8 hours per night can help with weight loss without having to change your diet or increase your physical activity. And if you feel you snore and still wake up tired, talk with me about sleep apnea.  8.  SET ASIDE TIME TO DISCONNECT  Just get out there. Disconnect from the electronics and connect to the elements. Not only will this help reduce stress (a major factor in weight gain) by giving your mind a break from the constant stimulation we've all become so accustomed to, but it may also reprogram your brain to connect with yourself and what you're feeling.     When it comes to diets, agreement about the perfect plan isn't easy to find, even among the experts. Experts at the [employer] developed an idea known as the Healthy Eating Plate. Just imagine a plate divided into logical, healthy portions.  The emphasis is on diet quality:  Load up on vegetables and fruits - one-half of your plate: Aim  for color and variety, and remember that potatoes don't count.  Go for whole grains - one-quarter of your plate: Whole wheat, barley, wheat berries, quinoa, oats, brown rice, and foods made with them. If you want pasta, go with whole wheat pasta.  Protein power - one-quarter of your plate: Fish, chicken, beans, and nuts are all healthy, versatile protein sources. Limit red meat.  The diet, however, does go beyond the plate, offering a few other suggestions.  Use healthy plant oils, such as olive, canola, soy, corn, sunflower and peanut. Check the labels, and avoid partially hydrogenated oil, which have unhealthy trans fats.  If you're thirsty, drink water. Coffee and tea are  good in moderation, but skip sugary drinks and limit milk and dairy products to one or two daily servings.  The type of carbohydrate in the diet is more important than the amount. Some sources of carbohydrates, such as vegetables, fruits, whole grains, and beans-are healthier than others.  Finally, stay active.

## 2018-02-17 LAB — HEMOGLOBIN A1C
Hgb A1c MFr Bld: 6 % of total Hgb — ABNORMAL HIGH (ref <5.7)
Mean Plasma Glucose: 126 (calc)
eAG (mmol/L): 7 (calc)

## 2018-03-01 ENCOUNTER — Other Ambulatory Visit: Payer: Self-pay | Admitting: Physician Assistant

## 2018-06-01 ENCOUNTER — Other Ambulatory Visit: Payer: Self-pay | Admitting: Physician Assistant

## 2018-06-01 ENCOUNTER — Other Ambulatory Visit: Payer: Self-pay | Admitting: Internal Medicine

## 2018-06-01 DIAGNOSIS — I10 Essential (primary) hypertension: Secondary | ICD-10-CM

## 2018-06-05 ENCOUNTER — Other Ambulatory Visit: Payer: Self-pay | Admitting: Adult Health

## 2018-07-17 DIAGNOSIS — E669 Obesity, unspecified: Secondary | ICD-10-CM | POA: Insufficient documentation

## 2018-07-17 NOTE — Progress Notes (Deleted)
Complete Physical  Assessment and Plan:  Discussed med's effects and SE's. Screening labs and tests as requested with regular follow-up as recommended. Over 40 minutes of exam, counseling, chart review, and complex, high level critical decision making was performed this visit.   HPI  62 y.o. female  presents for a complete physical and follow up for has Fibromyalgia; H/O gastric bypass; Mixed hyperlipidemia; Adjustment disorder with mixed anxiety and depressed mood; Prediabetes; Essential hypertension, benign; Medication management; and Vitamin D deficiency on their problem list..  Her blood pressure {HAS HAS NOT:18834} been controlled at home, today their BP is   She {DOES_DOES YSA:63016} workout. She denies chest pain, shortness of breath, dizziness.   BMI is There is no height or weight on file to calculate BMI., she is working on diet and exercise. She is s/p gastric bypass. She had a negative sleep study this year.   Wt Readings from Last 3 Encounters:  02/16/18 261 lb (118.4 kg)  11/30/17 254 lb 6.4 oz (115.4 kg)  10/06/17 265 lb (120.2 kg)   She has FM and depression, has had negative autoimmune work up and has seen rheum, she is on cymbalta and wellbutrin and doing well. She is on valium 1-2 x a month.   She {ACTION; IS/IS WFU:93235573} on cholesterol medication and denies myalgias. Her cholesterol {ACTION; IS/IS NOT:21021397} at goal. The cholesterol last visit was:   Lab Results  Component Value Date   CHOL 180 02/16/2018   HDL 60 02/16/2018   LDLCALC 96 02/16/2018   TRIG 138 02/16/2018   CHOLHDL 3.0 02/16/2018    She {Has/has not:18111} been working on diet and exercise for prediabetes, she {ACTION; IS/IS NOT:21021397} on bASA, she {ACTION; IS/IS NOT:21021397} on ACE/ARB and denies {Symptoms; diabetes w/o none:19199}. Last A1C in the office was:  Lab Results  Component Value Date   HGBA1C 6.0 (H) 02/16/2018   Last GFR: Lab Results  Component Value Date   GFRNONAA 79  02/16/2018    Patient is on Vitamin D supplement.   Lab Results  Component Value Date   VD25OH 50 04/14/2015      Current Medications:  Current Outpatient Medications on File Prior to Visit  Medication Sig Dispense Refill  . amitriptyline (ELAVIL) 50 MG tablet TAKE 1-2 TABLETS DAILY AT BEDTIME AS NEEDED FOR SLEEP 60 tablet 1  . atenolol (TENORMIN) 50 MG tablet TAKE 1 TABLET BY MOUTH DAILY. 90 tablet 1  . buPROPion (WELLBUTRIN XL) 150 MG 24 hr tablet TAKE 1 TABLET BY MOUTH EVERY DAY IN THE MORNING 90 tablet 2  . Calcium Carbonate-Vit D-Min (CALCIUM 1200 PO) Take by mouth daily.    . diazepam (VALIUM) 2 MG tablet 1/2-1 tablet as needed up to 2 x daily for anxiety or sleep 30 tablet 0  . diclofenac sodium (VOLTAREN) 1 % GEL Apply 4 g topically 4 (four) times daily. 100 g 3  . DULoxetine (CYMBALTA) 60 MG capsule TAKE 1 CAPSULE BY MOUTH EVERY DAY 90 capsule 1  . hydrochlorothiazide (HYDRODIURIL) 25 MG tablet Take 1/2 - 1 tab by mouth daily 90 tablet 3  . losartan (COZAAR) 100 MG tablet TAKE 1 TABLET BY MOUTH EVERY DAY 90 tablet 1  . Multiple Vitamins-Minerals (MULTIVITAMIN WITH MINERALS) tablet Take 1 tablet by mouth daily.    . traMADol (ULTRAM) 50 MG tablet Take 1 tablet (50 mg total) by mouth 4 (four) times daily as needed for severe pain. 20 tablet 0   No current facility-administered medications on file prior  to visit.    Allergies:  Allergies  Allergen Reactions  . E-Mycin [Erythromycin]     GI upset   Medical History:  She has Fibromyalgia; H/O gastric bypass; Mixed hyperlipidemia; Adjustment disorder with mixed anxiety and depressed mood; Prediabetes; Essential hypertension, benign; Medication management; and Vitamin D deficiency on their problem list. Health Maintenance:   Immunization History  Administered Date(s) Administered  . Pneumococcal-Unspecified 01/01/2004  . Td 10/18/2000  . Tdap 07/05/2013  . Zoster 06/03/2017    Tetanus: 2014 Pneumovax: 2005 Prevnar 13:   Flu vaccine: Zostavax: 2018  Pap: MGM:  DEXA: Colonoscopy: EGD:  Last Dental Exam: Last Eye Exam: Patient Care Team: Unk Pinto, MD as PCP - General (Internal Medicine)  Surgical History:  She has a past surgical history that includes Gastric bypass; Tonsillectomy; Appendectomy; Wisdom tooth extraction; and salivary stones removed. Family History:  Herfamily history includes Alzheimer's disease in her father; Emphysema in her mother; Heart disease in her mother; Hypertension in her mother; Multiple sclerosis in her sister; Osteoporosis in her mother. Social History:  She reports that she has never smoked. She has never used smokeless tobacco.  Review of Systems: ROS  Physical Exam: Estimated body mass index is 46.23 kg/m as calculated from the following:   Height as of 02/16/18: 5\' 3"  (1.6 m).   Weight as of 02/16/18: 261 lb (118.4 kg). There were no vitals taken for this visit. General Appearance: Well nourished, in no apparent distress.  Eyes: PERRLA, EOMs, conjunctiva no swelling or erythema, normal fundi and vessels.  Sinuses: No Frontal/maxillary tenderness  ENT/Mouth: Ext aud canals clear, normal light reflex with TMs without erythema, bulging. Good dentition. No erythema, swelling, or exudate on post pharynx. Tonsils not swollen or erythematous. Hearing normal.  Neck: Supple, thyroid normal. No bruits  Respiratory: Respiratory effort normal, BS equal bilaterally without rales, rhonchi, wheezing or stridor.  Cardio: RRR without murmurs, rubs or gallops. Brisk peripheral pulses without edema.  Chest: symmetric, with normal excursions and percussion.  Breasts: Symmetric, without lumps, nipple discharge, retractions.  Abdomen: Soft, nontender, no guarding, rebound, hernias, masses, or organomegaly.  Lymphatics: Non tender without lymphadenopathy.  Genitourinary:  Musculoskeletal: Full ROM all peripheral extremities,5/5 strength, and normal gait.  Skin: Warm, dry  without rashes, lesions, ecchymosis. Neuro: Cranial nerves intact, reflexes equal bilaterally. Normal muscle tone, no cerebellar symptoms. Sensation intact.  Psych: Awake and oriented X 3, normal affect, Insight and Judgment appropriate.   EKG: WNL no ST changes. AORTA SCAN: WNL   Vicie Mutters 9:50 AM [facility]

## 2018-07-19 ENCOUNTER — Encounter: Payer: Self-pay | Admitting: Physician Assistant

## 2018-09-17 ENCOUNTER — Other Ambulatory Visit: Payer: Self-pay | Admitting: Physician Assistant

## 2018-09-17 DIAGNOSIS — I10 Essential (primary) hypertension: Secondary | ICD-10-CM

## 2018-09-21 NOTE — Progress Notes (Deleted)
Complete Physical  Assessment and Plan:  Discussed med's effects and SE's. Screening labs and tests as requested with regular follow-up as recommended. Over 40 minutes of exam, counseling, chart review, and complex, high level critical decision making was performed this visit.   HPI  62 y.o. female  presents for a complete physical and follow up for has Fibromyalgia; H/O gastric bypass; Mixed hyperlipidemia; Adjustment disorder with mixed anxiety and depressed mood; Prediabetes; Essential hypertension, benign; Medication management; Vitamin D deficiency; and Morbid obesity ([place]) on their problem list..  Her blood pressure {HAS HAS NOT:18834} been controlled at home, today their BP is   She {DOES_DOES CVE:93810} workout. She denies chest pain, shortness of breath, dizziness.  BMI is There is no height or weight on file to calculate BMI., she is working on diet and exercise. Wt Readings from Last 3 Encounters:  02/16/18 261 lb (118.4 kg)  11/30/17 254 lb 6.4 oz (115.4 kg)  10/06/17 265 lb (120.2 kg)   She has depression and FM, is on cymbalta and wellbutrin. She is on elavil for sleep, as well as takes valium AS needed.  She {ACTION; IS/IS FBP:10258527} on cholesterol medication and denies myalgias. Her cholesterol {ACTION; IS/IS NOT:21021397} at goal. The cholesterol last visit was:   Lab Results  Component Value Date   CHOL 180 02/16/2018   HDL 60 02/16/2018   LDLCALC 96 02/16/2018   TRIG 138 02/16/2018   CHOLHDL 3.0 02/16/2018   She {Has/has not:18111} been working on diet and exercise for prediabetes, she {ACTION; IS/IS NOT:21021397} on bASA, she {ACTION; IS/IS NOT:21021397} on ACE/ARB and denies {Symptoms; diabetes w/o none:19199}. Last A1C in the office was:  Lab Results  Component Value Date   HGBA1C 6.0 (H) 02/16/2018   Last GFR: Lab Results  Component Value Date   GFRNONAA 79 02/16/2018   Patient is on Vitamin D supplement.   Lab Results  Component Value Date   VD25OH  50 04/14/2015      Current Medications:  Current Outpatient Medications on File Prior to Visit  Medication Sig Dispense Refill  . amitriptyline (ELAVIL) 50 MG tablet TAKE 1-2 TABLETS DAILY AT BEDTIME AS NEEDED FOR SLEEP 60 tablet 1  . atenolol (TENORMIN) 50 MG tablet TAKE 1 TABLET BY MOUTH DAILY. 90 tablet 1  . buPROPion (WELLBUTRIN XL) 150 MG 24 hr tablet TAKE 1 TABLET BY MOUTH EVERY DAY IN THE MORNING 90 tablet 2  . Calcium Carbonate-Vit D-Min (CALCIUM 1200 PO) Take by mouth daily.    . diazepam (VALIUM) 2 MG tablet 1/2-1 tablet as needed up to 2 x daily for anxiety or sleep 30 tablet 0  . diclofenac sodium (VOLTAREN) 1 % GEL Apply 4 g topically 4 (four) times daily. 100 g 3  . DULoxetine (CYMBALTA) 60 MG capsule TAKE 1 CAPSULE BY MOUTH EVERY DAY 90 capsule 1  . hydrochlorothiazide (HYDRODIURIL) 25 MG tablet Take 1/2 - 1 tab by mouth daily 90 tablet 3  . losartan (COZAAR) 100 MG tablet TAKE 1 TABLET BY MOUTH EVERY DAY 90 tablet 0  . Multiple Vitamins-Minerals (MULTIVITAMIN WITH MINERALS) tablet Take 1 tablet by mouth daily.    . traMADol (ULTRAM) 50 MG tablet Take 1 tablet (50 mg total) by mouth 4 (four) times daily as needed for severe pain. 20 tablet 0   No current facility-administered medications on file prior to visit.    Allergies:  Allergies  Allergen Reactions  . E-Mycin [Erythromycin]     GI upset   Medical History:  She has Fibromyalgia; H/O gastric bypass; Mixed hyperlipidemia; Adjustment disorder with mixed anxiety and depressed mood; Prediabetes; Essential hypertension, benign; Medication management; Vitamin D deficiency; and Morbid obesity ([place]) on their problem list. Health Maintenance:   Immunization History  Administered Date(s) Administered  . Pneumococcal-Unspecified 01/01/2004  . Td 10/18/2000  . Tdap 07/05/2013  . Zoster 06/03/2017   Tetanus: 2014 Pneumovax: 2005 Prevnar 13:  Flu vaccine: Zostavax:  LMP: Pap: MGM:  DEXA: Colonoscopy: EGD:  Last  Dental Exam: Last Eye Exam: Patient Care Team: Unk Pinto, MD as PCP - General (Internal Medicine)  Surgical History:  She has a past surgical history that includes Gastric bypass; Tonsillectomy; Appendectomy; Wisdom tooth extraction; and salivary stones removed. Family History:  Herfamily history includes Alzheimer's disease in her father; Emphysema in her mother; Heart disease in her mother; Hypertension in her mother; Multiple sclerosis in her sister; Osteoporosis in her mother. Social History:  She reports that she has never smoked. She has never used smokeless tobacco.  Review of Systems: ROS  Physical Exam: Estimated body mass index is 46.23 kg/m as calculated from the following:   Height as of 02/16/18: 5\' 3"  (1.6 m).   Weight as of 02/16/18: 261 lb (118.4 kg). There were no vitals taken for this visit. General Appearance: Well nourished, in no apparent distress.  Eyes: PERRLA, EOMs, conjunctiva no swelling or erythema, normal fundi and vessels.  Sinuses: No Frontal/maxillary tenderness  ENT/Mouth: Ext aud canals clear, normal light reflex with TMs without erythema, bulging. Good dentition. No erythema, swelling, or exudate on post pharynx. Tonsils not swollen or erythematous. Hearing normal.  Neck: Supple, thyroid normal. No bruits  Respiratory: Respiratory effort normal, BS equal bilaterally without rales, rhonchi, wheezing or stridor.  Cardio: RRR without murmurs, rubs or gallops. Brisk peripheral pulses without edema.  Chest: symmetric, with normal excursions and percussion.  Breasts: Symmetric, without lumps, nipple discharge, retractions.  Abdomen: Soft, nontender, no guarding, rebound, hernias, masses, or organomegaly.  Lymphatics: Non tender without lymphadenopathy.  Genitourinary:  Musculoskeletal: Full ROM all peripheral extremities,5/5 strength, and normal gait.  Skin: Warm, dry without rashes, lesions, ecchymosis. Neuro: Cranial nerves intact, reflexes equal  bilaterally. Normal muscle tone, no cerebellar symptoms. Sensation intact.  Psych: Awake and oriented X 3, normal affect, Insight and Judgment appropriate.   EKG: WNL no ST changes. AORTA SCAN: WNL   Vicie Mutters 1:02 PM [facility]

## 2018-09-25 ENCOUNTER — Encounter: Payer: Self-pay | Admitting: Physician Assistant

## 2018-10-03 DIAGNOSIS — H25013 Cortical age-related cataract, bilateral: Secondary | ICD-10-CM | POA: Diagnosis not present

## 2018-10-03 DIAGNOSIS — H35033 Hypertensive retinopathy, bilateral: Secondary | ICD-10-CM | POA: Diagnosis not present

## 2018-10-03 DIAGNOSIS — H5200 Hypermetropia, unspecified eye: Secondary | ICD-10-CM | POA: Diagnosis not present

## 2018-10-03 DIAGNOSIS — H524 Presbyopia: Secondary | ICD-10-CM | POA: Diagnosis not present

## 2018-10-03 DIAGNOSIS — H2513 Age-related nuclear cataract, bilateral: Secondary | ICD-10-CM | POA: Diagnosis not present

## 2018-10-03 LAB — HM DIABETES EYE EXAM

## 2018-10-09 ENCOUNTER — Other Ambulatory Visit: Payer: Self-pay | Admitting: Adult Health

## 2018-10-09 DIAGNOSIS — I10 Essential (primary) hypertension: Secondary | ICD-10-CM

## 2018-10-26 ENCOUNTER — Encounter: Payer: Self-pay | Admitting: *Deleted

## 2018-11-20 NOTE — Progress Notes (Signed)
COMPLETE PHYSICAL  Assessment and Plan:   Encounter for general adult medical examination with abnormal findings 1 year Will get MGM, colonoscopy and PAP done today  Essential hypertension, benign - continue medications, DASH diet, exercise and monitor at home. Call if greater than 130/80.  -     CBC with Differential/Platelet -     COMPLETE METABOLIC PANEL WITH GFR -     TSH -     EKG 12-Lead -     Urinalysis, Routine w reflex microscopic -     Microalbumin / creatinine urine ratio  Mixed hyperlipidemia check lipids decrease fatty foods increase activity.  -     Lipid panel  Morbid obesity ([employer]) - follow up 3 months for progress monitoring - increase veggies, decrease carbs - long discussion about weight loss, diet, and exercise Continue weight watchers and exercise  Prediabetes -     Hemoglobin A1c Discussed disease progression and risks Discussed diet/exercise, weight management and risk modification  Fibromyalgia Continue medications  H/O gastric bypass Monitor levels  Adjustment disorder with mixed anxiety and depressed mood Continue medications, well controlled  Medication management -     Magnesium  Vitamin D deficiency -     VITAMIN D 25 Hydroxy (Vit-D Deficiency, Fractures)  Screen for colon cancer -     Ambulatory referral to Gastroenterology  Encounter for hepatitis C screening test for low risk patient -     Hepatitis C antibody  Screening for HIV (human immunodeficiency virus) -     HIV Antibody (routine testing w rflx)  Screening for malignant neoplasm of cervix -     Cytology - PAP  Post-menopausal bleeding -     US PELVIC COMPLETE WITH TRANSVAGINAL; Future - blood on the brush/speculum, will get [place]   Continue diet and meds as discussed. Further disposition pending results of labs.  HPI  63 y.o. female  presents for CPE and 3 month follow up with hypertension, hyperlipidemia, prediabetes and vitamin D.  She has FM and depression,  has had negative autoimmune work up and has seen rheum, she is on cymbalta and wellbutrin and doing well. She is on valium 1-2 x a month.    Her blood pressure has been controlled at home, today their BP is BP: 124/86   She does not workout. She denies chest pain, shortness of breath, dizziness.    She is on cholesterol medication and denies myalgias. Her cholesterol is at goal. The cholesterol last visit was:   Lab Results  Component Value Date   CHOL 180 02/16/2018   HDL 60 02/16/2018   LDLCALC 96 02/16/2018   TRIG 138 02/16/2018   CHOLHDL 3.0 02/16/2018    She has not been working on diet and exercise for prediabetes, and denies foot ulcerations, hyperglycemia, hypoglycemia , increased appetite, nausea, paresthesia of the feet, polydipsia, polyuria, visual disturbances, vomiting and weight loss. Last A1C in the office was:  Lab Results  Component Value Date   HGBA1C 6.0 (H) 02/16/2018   Patient is on Vitamin D supplement.  Lab Results  Component Value Date   VD25OH 50 04/14/2015     BMI is Body mass index is 44.43 kg/m., she is working on diet and exercise. She was given information about mini habits, emotional eating. She is doing weight watchers, and going to start working out again video.  Wt Readings from Last 3 Encounters:  11/22/18 250 lb 12.8 oz (113.8 kg)  02/16/18 261 lb (118.4 kg)  11/30/17 254 lb  6.4 oz (115.4 kg)     Current Medications:  Current Outpatient Medications on File Prior to Visit  Medication Sig Dispense Refill  . amitriptyline (ELAVIL) 50 MG tablet TAKE 1-2 TABLETS DAILY AT BEDTIME AS NEEDED FOR SLEEP 60 tablet 1  . atenolol (TENORMIN) 50 MG tablet TAKE 1 TABLET BY MOUTH DAILY. 90 tablet 1  . buPROPion (WELLBUTRIN XL) 150 MG 24 hr tablet TAKE 1 TABLET BY MOUTH EVERY DAY IN THE MORNING 90 tablet 2  . Calcium Carbonate-Vit D-Min (CALCIUM 1200 PO) Take by mouth daily.    . DULoxetine (CYMBALTA) 60 MG capsule TAKE 1 CAPSULE BY MOUTH EVERY DAY 90 capsule 1   . hydrochlorothiazide (HYDRODIURIL) 25 MG tablet TAKE 1/2 - 1 TAB BY MOUTH DAILY 90 tablet 0  . losartan (COZAAR) 100 MG tablet TAKE 1 TABLET BY MOUTH EVERY DAY 90 tablet 0  . Multiple Vitamins-Minerals (MULTIVITAMIN WITH MINERALS) tablet Take 1 tablet by mouth daily.     No current facility-administered medications on file prior to visit.     Medical History:  Past Medical History:  Diagnosis Date  . Adjustment disorder with mixed anxiety and depressed mood 11/25/2013  . Fibromyalgia   . H/O gastric bypass   . Mixed hyperlipidemia 11/25/2013   Immunization History  Administered Date(s) Administered  . Pneumococcal-Unspecified 01/01/2004  . Td 10/18/2000  . Tdap 07/05/2013  . Zoster 06/03/2017   MGM 2003- OVERDUE Colonoscopy- NEVER- after counseling will get colonoscopy PAP- never abnormal- LONG time ago Influenza get at pharmacy  Sleep study 08/2017- NEGATIVE SLEEP STUDY Echo 2006 Eye exam 09/2018 CXR 2008  Review of Systems:  Review of Systems  Constitutional: Negative for chills, fever and malaise/fatigue.  HENT: Negative for congestion, ear pain and sore throat.   Eyes: Negative.   Respiratory: Negative for cough, shortness of breath and wheezing.   Cardiovascular: Negative for chest pain, palpitations and leg swelling.  Gastrointestinal: Negative for abdominal pain, blood in stool, constipation, diarrhea, heartburn and melena.  Genitourinary: Negative.   Musculoskeletal: Negative for joint pain.  Skin: Negative for rash.  Neurological: Negative for dizziness, sensory change, loss of consciousness and headaches.  Psychiatric/Behavioral: Positive for depression. The patient is nervous/anxious and has insomnia.    Allergies Allergies  Allergen Reactions  . E-Mycin [Erythromycin]     GI upset    SURGICAL HISTORY She  has a past surgical history that includes Gastric bypass; Tonsillectomy; Appendectomy; Wisdom tooth extraction; salivary stones removed; and  Exploratory laparotomy (2006). FAMILY HISTORY Her family history includes Alzheimer's disease in her father; Emphysema in her mother; Heart disease in her father; Multiple sclerosis in her sister; Osteoporosis in her mother. SOCIAL HISTORY She  reports that she has never smoked. She has never used smokeless tobacco. She reports previous alcohol use. She reports that she does not use drugs.   Physical Exam: BP 124/86   Pulse 83   Temp 98.1 F (36.7 C)   Ht 5\' 3"  (1.6 m)   Wt 250 lb 12.8 oz (113.8 kg)   SpO2 95%   BMI 44.43 kg/m  Wt Readings from Last 3 Encounters:  11/22/18 250 lb 12.8 oz (113.8 kg)  02/16/18 261 lb (118.4 kg)  11/30/17 254 lb 6.4 oz (115.4 kg)    General Appearance: Well nourished well developed, in no apparent distress. Eyes: PERRLA, EOMs, conjunctiva no swelling or erythema ENT/Mouth: Ear canals normal without obstruction, swelling, erythma, discharge.  TMs normal bilaterally.  Oropharynx moist, clear, without exudate, or postoropharyngeal swelling.  Neck: Supple, thyroid normal,no cervical adenopathy  Respiratory: Respiratory effort normal, Breath sounds clear A&P without rhonchi, wheeze, or rale.  No retractions, no accessory usage. Cardio: RRR with no MRGs. Brisk peripheral pulses without edema.  Abdomen: Soft, + BS,  Non tender, no guarding, rebound, hernias, masses. Musculoskeletal: Full ROM, 5/5 strength, Normal gait,  Skin: Warm, dry without rashes, lesions, ecchymosis.  GYN VULVA: vulvar erythema, vulvar hypopigmentation, VAGINA: normal appearing vagina with normal color and discharge, no lesions, exam limited by body habitus, unable to visualize cervix well,  PAP: Pap smear done today, had bright red blood on the speculum and brush. Neuro: Awake and oriented X 3, Cranial nerves intact. Normal muscle tone, no cerebellar symptoms. Psych: Normal affect, Insight and Judgment appropriate.   EKG: sinus brady, PRWP, no ST changes  Vicie Mutters, PA-C 10:58  AM [facility]

## 2018-11-22 ENCOUNTER — Other Ambulatory Visit (HOSPITAL_COMMUNITY)
Admission: RE | Admit: 2018-11-22 | Discharge: 2018-11-22 | Disposition: A | Discharge status: Home / Self Care | Payer: BLUE CROSS/BLUE SHIELD | Source: Ambulatory Visit | Attending: Physician Assistant | Admitting: Physician Assistant

## 2018-11-22 ENCOUNTER — Ambulatory Visit (INDEPENDENT_AMBULATORY_CARE_PROVIDER_SITE_OTHER): Payer: BLUE CROSS/BLUE SHIELD | Admitting: Physician Assistant

## 2018-11-22 ENCOUNTER — Encounter: Payer: Self-pay | Admitting: Physician Assistant

## 2018-11-22 VITALS — BP 124/86 | HR 83 | Temp 98.1°F | Ht 63.0 in | Wt 250.8 lb

## 2018-11-22 DIAGNOSIS — Z1329 Encounter for screening for other suspected endocrine disorder: Secondary | ICD-10-CM

## 2018-11-22 DIAGNOSIS — E782 Mixed hyperlipidemia: Secondary | ICD-10-CM

## 2018-11-22 DIAGNOSIS — Z136 Encounter for screening for cardiovascular disorders: Secondary | ICD-10-CM

## 2018-11-22 DIAGNOSIS — Z124 Encounter for screening for malignant neoplasm of cervix: Secondary | ICD-10-CM | POA: Diagnosis not present

## 2018-11-22 DIAGNOSIS — Z0000 Encounter for general adult medical examination without abnormal findings: Secondary | ICD-10-CM | POA: Diagnosis not present

## 2018-11-22 DIAGNOSIS — Z9884 Bariatric surgery status: Secondary | ICD-10-CM

## 2018-11-22 DIAGNOSIS — Z79899 Other long term (current) drug therapy: Secondary | ICD-10-CM | POA: Diagnosis not present

## 2018-11-22 DIAGNOSIS — Z1389 Encounter for screening for other disorder: Secondary | ICD-10-CM | POA: Diagnosis not present

## 2018-11-22 DIAGNOSIS — Z114 Encounter for screening for human immunodeficiency virus [HIV]: Secondary | ICD-10-CM

## 2018-11-22 DIAGNOSIS — N950 Postmenopausal bleeding: Secondary | ICD-10-CM

## 2018-11-22 DIAGNOSIS — R7303 Prediabetes: Secondary | ICD-10-CM

## 2018-11-22 DIAGNOSIS — F4323 Adjustment disorder with mixed anxiety and depressed mood: Secondary | ICD-10-CM

## 2018-11-22 DIAGNOSIS — I10 Essential (primary) hypertension: Secondary | ICD-10-CM | POA: Diagnosis not present

## 2018-11-22 DIAGNOSIS — E559 Vitamin D deficiency, unspecified: Secondary | ICD-10-CM | POA: Diagnosis not present

## 2018-11-22 DIAGNOSIS — Z13220 Encounter for screening for lipoid disorders: Secondary | ICD-10-CM

## 2018-11-22 DIAGNOSIS — Z1159 Encounter for screening for other viral diseases: Secondary | ICD-10-CM

## 2018-11-22 DIAGNOSIS — Z0001 Encounter for general adult medical examination with abnormal findings: Secondary | ICD-10-CM

## 2018-11-22 DIAGNOSIS — Z1211 Encounter for screening for malignant neoplasm of colon: Secondary | ICD-10-CM

## 2018-11-22 DIAGNOSIS — M797 Fibromyalgia: Secondary | ICD-10-CM

## 2018-11-22 NOTE — Patient Instructions (Addendum)
[email or web address] for Nicole Padilla  Going to refer for colonoscopy  Colon cancer is 3rd most diagnosed cancer and 2nd leading cause of death in both men and women 63 years of age and older despite being one of the most preventable and treatable cancers if found early.  4 of out 5 people diagnosed with colon cancer have NO prior family history.  When caught EARLY 90% of colon cancer is curable.  YOU CAN CALL TO MAKE AN ULTRASOUND..  I have put in an order for an ultrasound for you to have You can set them up at your convenience by calling this number [phone] You will likely have the ultrasound at [address]  If you have any issues call our office and we will set this up for you.      HOW TO SCHEDULE A MAMMOGRAM  [facility]  7 a.m.-6:30 p.m., Monday 7 a.m.-5 p.m., Tuesday-Friday Schedule an appointment by calling [phone].  VENOUS INSUFFICIENCY Our lower leg venous system is not the most reliable, the heart does NOT pump fluid up, there is a valve system.  The muscles of the leg squeeze and the blood moves up and a valve opens and close, then they squeeze, blood moves up and valves open and closes keeping the blood moving towards the heart.  Lots can go wrong with this valve system.  If someone is sitting or standing without movement, everyone will get swelling.  THINGS TO DO:  Do not stand or sit in one position for long periods of time. Do not sit with your legs crossed. Rest with your legs raised during the day.  Your legs have to be higher than your heart so that gravity will force the valves to open, so please really elevate your legs.   Wear elastic stockings or support hose. Do not wear other tight, encircling garments around the legs, pelvis, or waist.  ELAST[employer]ER[employer]  has a wide variety of well priced compression stockings. [place], [place] [address] [id number] [place] has a good cheap selection, I  like the socks, they are not as hard to get on  Walk as much as possible to increase blood flow.  Raise the foot of your bed at night with 2-inch blocks.  SEEK MEDICAL CARE IF:   The skin around your ankle starts to break down.  You have pain, redness, tenderness, or hard swelling developing in your leg over a vein.  You are uncomfortable due to leg pain.  If you ever have shortness of breath with exertion or chest pain go to the ER.  WATER IS IMPORTANT  Being dehydrated can hurt your kidneys, cause fatigue, headaches, muscle aches, joint pain, and dry skin/nails so please increase your fluids.   Drink 80-100 oz a day of water, measure it out! Eat 3 meals a day, have to do breakfast, eat protein- hard boiled eggs, protein bar like [place] protein bar, greek yogurt like oikos triple zero, chobani 100, or light n fit greek  Can check out plantnanny app on your phone to help you keep track of your water      Google mindful eating and here are some tips and tricks below.   Rate your hunger before you eat on a scale of 1-10, try to eat closer to a 6 or higher. And if you are at below that, why are you eating? Slow down and listen to  your body.

## 2018-11-24 LAB — CBC WITH DIFFERENTIAL/PLATELET
Absolute Monocytes: 345 cells/uL (ref 200–950)
BASOS ABS: 21 {cells}/uL (ref 0–200)
Basophils Relative: 0.3 %
EOS ABS: 62 {cells}/uL (ref 15–500)
EOS PCT: 0.9 %
HEMATOCRIT: 43.2 % (ref 35.0–45.0)
HEMOGLOBIN: 13.7 g/dL (ref 11.7–15.5)
LYMPHS ABS: 1835 {cells}/uL (ref 850–3900)
MCH: 26.4 pg — AB (ref 27.0–33.0)
MCHC: 31.7 g/dL — AB (ref 32.0–36.0)
MCV: 83.2 fL (ref 80.0–100.0)
MPV: 12.2 fL (ref 7.5–12.5)
Monocytes Relative: 5 %
NEUTROS ABS: 4637 {cells}/uL (ref 1500–7800)
NEUTROS PCT: 67.2 %
Platelets: 275 10*3/uL (ref 140–400)
RBC: 5.19 10*6/uL — ABNORMAL HIGH (ref 3.80–5.10)
RDW: 14 % (ref 11.0–15.0)
Total Lymphocyte: 26.6 %
WBC: 6.9 10*3/uL (ref 3.8–10.8)

## 2018-11-24 LAB — COMPLETE METABOLIC PANEL WITH GFR
AG RATIO: 1.4 (calc) (ref 1.0–2.5)
ALBUMIN MSPROF: 4.2 g/dL (ref 3.6–5.1)
ALKALINE PHOSPHATASE (APISO): 68 U/L (ref 37–153)
ALT: 15 U/L (ref 6–29)
AST: 18 U/L (ref 10–35)
BILIRUBIN TOTAL: 0.5 mg/dL (ref 0.2–1.2)
BUN: 14 mg/dL (ref 7–25)
CHLORIDE: 103 mmol/L (ref 98–110)
CO2: 29 mmol/L (ref 20–32)
CREATININE: 0.74 mg/dL (ref 0.50–0.99)
Calcium: 9.7 mg/dL (ref 8.6–10.4)
GFR, Est African American: 100 mL/min/{1.73_m2} (ref >=60)
GFR, Est Non African American: 86 mL/min/{1.73_m2} (ref >=60)
GLOBULIN: 2.9 g/dL (ref 1.9–3.7)
Glucose, Bld: 88 mg/dL (ref 65–99)
POTASSIUM: 4.2 mmol/L (ref 3.5–5.3)
SODIUM: 140 mmol/L (ref 135–146)
Total Protein: 7.1 g/dL (ref 6.1–8.1)

## 2018-11-24 LAB — LIPID PANEL
Cholesterol: 183 mg/dL (ref <200)
HDL: 61 mg/dL (ref >=50)
LDL Cholesterol (Calc): 102 mg/dL (calc) — ABNORMAL HIGH
Non-HDL Cholesterol (Calc): 122 mg/dL (calc) (ref <130)
Total CHOL/HDL Ratio: 3 (calc) (ref <5.0)
Triglycerides: 103 mg/dL (ref <150)

## 2018-11-24 LAB — URINALYSIS, ROUTINE W REFLEX MICROSCOPIC
BILIRUBIN URINE: NEGATIVE
Bacteria, UA: NONE SEEN /HPF
Glucose, UA: NEGATIVE
Hgb urine dipstick: NEGATIVE
Hyaline Cast: NONE SEEN /LPF
Ketones, ur: NEGATIVE
NITRITE: NEGATIVE
Protein, ur: NEGATIVE
RBC / HPF: NONE SEEN /HPF (ref 0–2)
Specific Gravity, Urine: 1.016 (ref 1.001–1.03)
pH: 6.5 (ref 5.0–8.0)

## 2018-11-24 LAB — HEPATITIS C ANTIBODY
Hepatitis C Ab: NONREACTIVE
SIGNAL TO CUT-OFF: 0.02 (ref <1.00)

## 2018-11-24 LAB — MICROALBUMIN / CREATININE URINE RATIO
Creatinine, Urine: 107 mg/dL (ref 20–275)
Microalb Creat Ratio: 5 mcg/mg creat (ref <30)
Microalb, Ur: 0.5 mg/dL

## 2018-11-24 LAB — HEMOGLOBIN A1C
EAG (MMOL/L): 6.6 (calc)
Hgb A1c MFr Bld: 5.8 % of total Hgb — ABNORMAL HIGH (ref <5.7)
Mean Plasma Glucose: 120 (calc)

## 2018-11-24 LAB — TSH: TSH: 1.56 mIU/L (ref 0.40–4.50)

## 2018-11-24 LAB — HIV ANTIBODY (ROUTINE TESTING W REFLEX): HIV 1&2 Ab, 4th Generation: NONREACTIVE

## 2018-11-24 LAB — MAGNESIUM: Magnesium: 2 mg/dL (ref 1.5–2.5)

## 2018-11-24 LAB — VITAMIN D 25 HYDROXY (VIT D DEFICIENCY, FRACTURES): Vit D, 25-Hydroxy: 31 ng/mL (ref 30–100)

## 2018-11-25 LAB — CYTOLOGY - PAP
Diagnosis: NEGATIVE
HPV: NOT DETECTED

## 2018-12-01 ENCOUNTER — Other Ambulatory Visit: Payer: Self-pay | Admitting: Physician Assistant

## 2018-12-01 DIAGNOSIS — Z1231 Encounter for screening mammogram for malignant neoplasm of breast: Secondary | ICD-10-CM

## 2018-12-03 ENCOUNTER — Other Ambulatory Visit: Payer: Self-pay | Admitting: Adult Health

## 2018-12-04 ENCOUNTER — Ambulatory Visit
Admission: RE | Admit: 2018-12-04 | Discharge: 2018-12-04 | Disposition: A | Discharge status: Home / Self Care | Payer: Managed Care, Other (non HMO) | Source: Ambulatory Visit | Attending: Physician Assistant | Admitting: Physician Assistant

## 2018-12-04 DIAGNOSIS — Z1231 Encounter for screening mammogram for malignant neoplasm of breast: Secondary | ICD-10-CM

## 2018-12-05 ENCOUNTER — Ambulatory Visit
Admission: RE | Admit: 2018-12-05 | Discharge: 2018-12-05 | Disposition: A | Discharge status: Home / Self Care | Payer: BLUE CROSS/BLUE SHIELD | Source: Ambulatory Visit | Attending: Physician Assistant | Admitting: Physician Assistant

## 2018-12-05 ENCOUNTER — Other Ambulatory Visit: Payer: Managed Care, Other (non HMO)

## 2018-12-05 DIAGNOSIS — N950 Postmenopausal bleeding: Secondary | ICD-10-CM | POA: Diagnosis not present

## 2018-12-06 ENCOUNTER — Other Ambulatory Visit: Payer: Self-pay | Admitting: Physician Assistant

## 2018-12-06 DIAGNOSIS — R928 Other abnormal and inconclusive findings on diagnostic imaging of breast: Secondary | ICD-10-CM

## 2018-12-12 ENCOUNTER — Ambulatory Visit
Admission: RE | Admit: 2018-12-12 | Discharge: 2018-12-12 | Disposition: A | Discharge status: Home / Self Care | Payer: BLUE CROSS/BLUE SHIELD | Source: Ambulatory Visit | Attending: Physician Assistant | Admitting: Physician Assistant

## 2018-12-12 ENCOUNTER — Other Ambulatory Visit: Payer: Self-pay | Admitting: Physician Assistant

## 2018-12-12 ENCOUNTER — Ambulatory Visit: Payer: BLUE CROSS/BLUE SHIELD

## 2018-12-12 DIAGNOSIS — R928 Other abnormal and inconclusive findings on diagnostic imaging of breast: Secondary | ICD-10-CM

## 2018-12-12 DIAGNOSIS — N6011 Diffuse cystic mastopathy of right breast: Secondary | ICD-10-CM | POA: Diagnosis not present

## 2018-12-12 DIAGNOSIS — N63 Unspecified lump in unspecified breast: Secondary | ICD-10-CM

## 2018-12-14 ENCOUNTER — Other Ambulatory Visit: Payer: Self-pay | Admitting: Internal Medicine

## 2018-12-14 DIAGNOSIS — I10 Essential (primary) hypertension: Secondary | ICD-10-CM

## 2018-12-17 ENCOUNTER — Other Ambulatory Visit: Payer: Self-pay | Admitting: Physician Assistant

## 2018-12-17 ENCOUNTER — Other Ambulatory Visit: Payer: Self-pay | Admitting: Internal Medicine

## 2018-12-17 DIAGNOSIS — I10 Essential (primary) hypertension: Secondary | ICD-10-CM

## 2018-12-30 ENCOUNTER — Other Ambulatory Visit: Payer: Self-pay | Admitting: Adult Health

## 2019-01-15 ENCOUNTER — Other Ambulatory Visit: Payer: Self-pay | Admitting: Adult Health

## 2019-01-15 DIAGNOSIS — I10 Essential (primary) hypertension: Secondary | ICD-10-CM

## 2019-02-01 DIAGNOSIS — M79641 Pain in right hand: Secondary | ICD-10-CM

## 2019-02-01 DIAGNOSIS — M79642 Pain in left hand: Principal | ICD-10-CM

## 2019-02-01 MED ORDER — DICLOFENAC SODIUM 1 % TD GEL: 4.0000 g | Freq: Four times a day (QID) | TRANSDERMAL | 3 refills | Status: DC

## 2019-03-13 ENCOUNTER — Encounter: Payer: Self-pay | Admitting: Physician Assistant

## 2019-03-23 ENCOUNTER — Other Ambulatory Visit: Payer: Self-pay | Admitting: Physician Assistant

## 2019-03-26 NOTE — Progress Notes (Signed)
Assessment and Plan:   Essential hypertension, benign - continue medications, DASH diet, exercise and monitor at home. Call if greater than 130/80.  - Go to the ER if any CP, SOB, nausea, dizziness, severe HA, changes vision/speech -     CBC with Differential/Platelet -     CMP/GFR -     TSH  Fibromyalgia Continue  cymbalta, start pool exercises, continue amitriptyline at night  Mixed hyperlipidemia -continue medications, check lipids, decrease fatty foods, increase activity.  -     Lipid panel  Adjustment disorder with mixed anxiety and depressed mood Fairly managed by current regimen; she didn't feel much benefit with addition of wellbutrin, she will trial stopping for 2 weeks and evaluate Stress management techniques discussed, increase water, good sleep hygiene discussed, increase exercise, and increase veggies.   Prediabetes Discussed diet/exercise, weight management  -     Hemoglobin A1c  Medication management -     Magnesium  Vitamin D deficiency Continue supplementation, has restarted unknown dose since last visit Check vitamin D level  Morbid obesity Long discussion about weight loss, diet, and exercise Recommended diet heavy in fruits and veggies and low in animal meats, cheeses, and dairy products, appropriate calorie intake Patient will work on having a plan for mindful eating, have a plan for managing stress, restart weight watchers program which has worked well before, weigh weekly  Discussed appropriate weight for height and initial goal (<250lb) Follow up at next visit   Continue diet and meds as discussed. Further disposition pending results of labs.  Future Appointments  Date Time Provider [place]  06/13/2019  8:00 AM [facility]  12/04/2019 10:00 AM Vicie Mutters, PA-C [facility] None     HPI  63 y.o. female  presents for 3 month follow up with hypertension, hyperlipidemia, prediabetes and vitamin D.  She has  FM and depression, has had negative autoimmune work up and has seen rheum, she is on cymbalta, amitryptiline and wellbutrin and doing fairly. She reports wellbutrin was added for focus (? Weight benefit as well), she is unsure if she has noted any improvement since this addition. She is on valium 1-2 x a month.  Last script refilled over a year ago.   BMI is Body mass index is 47.65 kg/m., she has not been working on diet and exercise very stressed with covid 19, admits getting bored and will splurge on multiple brownies in the evening. She was given information about mini habits, emotional eating, and time. She plans to restart weight watchers. She drinks tea, diet soda all day. She does have a glass of water after each soda. She drinks 100+ fluid ounces daily. She walks her dog around the block twice a day, but her work is reportedly quite active, moving furniture and boxes on daily basis.  Wt Readings from Last 3 Encounters:  03/27/19 269 lb (122 kg)  11/22/18 250 lb 12.8 oz (113.8 kg)  02/16/18 261 lb (118.4 kg)    Her blood pressure has been controlled at home, today their BP is BP: 130/80    She does not workout. She denies chest pain, shortness of breath, dizziness.     She is not on cholesterol medication and denies myalgias. Her cholesterol is not at goal. The cholesterol last visit was:   Lab Results  Component Value Date   CHOL 183 11/22/2018   HDL 61 11/22/2018   LDLCALC 102 (H) 11/22/2018   TRIG 103 11/22/2018   CHOLHDL  3.0 11/22/2018    She has not been working on diet and exercise for prediabetes, and denies foot ulcerations, hyperglycemia, hypoglycemia , increased appetite, nausea, paresthesia of the feet, polydipsia, polyuria, visual disturbances, vomiting and weight loss. Last A1C in the office was:  Lab Results  Component Value Date   HGBA1C 5.8 (H) 11/22/2018   Patient is on Vitamin D supplement, was off of vitamin D  Lab Results  Component Value Date   VD25OH 31  11/22/2018       Current Medications:  Current Outpatient Medications on File Prior to Visit  Medication Sig Dispense Refill  . amitriptyline (ELAVIL) 50 MG tablet Take 1 to 2 tablets at Bedtime as needed for Sleep 180 tablet 0  . atenolol (TENORMIN) 50 MG tablet TAKE 1 TABLET BY MOUTH DAILY. 90 tablet 1  . buPROPion (WELLBUTRIN XL) 150 MG 24 hr tablet TAKE 1 TABLET BY MOUTH EVERY DAY IN THE MORNING 90 tablet 2  . Calcium Carbonate-Vit D-Min (CALCIUM 1200 PO) Take by mouth daily.    . diclofenac sodium (VOLTAREN) 1 % GEL Apply 4 g topically 4 (four) times daily. (Patient taking differently: Apply 4 g topically as needed. ) 100 g 3  . DULoxetine (CYMBALTA) 60 MG capsule TAKE 1 CAPSULE BY MOUTH EVERY DAY 90 capsule 1  . hydrochlorothiazide (HYDRODIURIL) 25 MG tablet TAKE 1/2 TO 1 TABLET BY MOUTH DAILY 90 tablet 0  . losartan (COZAAR) 100 MG tablet TAKE 1 TABLET BY MOUTH EVERY DAY 90 tablet 0  . Multiple Vitamins-Minerals (MULTIVITAMIN WITH MINERALS) tablet Take 1 tablet by mouth daily.     No current facility-administered medications on file prior to visit.     Medical History:  Past Medical History:  Diagnosis Date  . Adjustment disorder with mixed anxiety and depressed mood 11/25/2013  . Fibromyalgia   . H/O gastric bypass   . Mixed hyperlipidemia 11/25/2013    Allergies:  Allergies  Allergen Reactions  . E-Mycin [Erythromycin]     GI upset     Review of Systems:  Review of Systems  Constitutional: Negative for chills, fever and malaise/fatigue.  HENT: Negative for congestion, ear pain and sore throat.   Eyes: Negative.   Respiratory: Negative for cough, shortness of breath and wheezing.   Cardiovascular: Negative for chest pain, palpitations and leg swelling.  Gastrointestinal: Negative for abdominal pain, blood in stool, constipation, diarrhea, heartburn and melena.  Genitourinary: Negative.   Musculoskeletal: Negative for joint pain.  Skin: Negative for rash.   Neurological: Negative for dizziness, sensory change, loss of consciousness and headaches.  Psychiatric/Behavioral: Negative for depression. The patient is nervous/anxious. The patient does not have insomnia.     Family history- Review and unchanged  Social history- Review and unchanged  Physical Exam: BP 130/80   Pulse (!) 59   Temp (!) 97.3 F (36.3 C)   Wt 269 lb (122 kg)   SpO2 97%   BMI 47.65 kg/m  Wt Readings from Last 3 Encounters:  03/27/19 269 lb (122 kg)  11/22/18 250 lb 12.8 oz (113.8 kg)  02/16/18 261 lb (118.4 kg)    General Appearance: Well nourished well developed, in no apparent distress. Eyes: PERRLA, EOMs, conjunctiva no swelling or erythema ENT/Mouth: Ear canals normal without obstruction, swelling, erythma, discharge.  TMs normal bilaterally.  Oropharynx moist, clear, without exudate, or postoropharyngeal swelling. Neck: Supple, thyroid normal,no cervical adenopathy  Respiratory: Respiratory effort normal, Breath sounds clear A&P without rhonchi, wheeze, or rale.  No retractions, no  accessory usage. Cardio: RRR with no MRGs. Brisk peripheral pulses without edema.  Abdomen: Soft, + BS,  Non tender, no guarding, rebound, hernias, masses. Musculoskeletal: Full ROM, 5/5 strength, Normal gait,  Skin: Warm, dry without rashes, lesions, ecchymosis.  Neuro: Awake and oriented X 3, Cranial nerves intact. Normal muscle tone, no cerebellar symptoms. Psych: Normal affect, Insight and Judgment appropriate.    Nicole Ribas, NP 9:39 AM [facility]

## 2019-03-27 ENCOUNTER — Ambulatory Visit: Payer: Self-pay | Admitting: Physician Assistant

## 2019-03-27 ENCOUNTER — Encounter: Payer: Self-pay | Admitting: Adult Health

## 2019-03-27 ENCOUNTER — Ambulatory Visit: Payer: BC Managed Care – PPO | Admitting: Adult Health

## 2019-03-27 ENCOUNTER — Other Ambulatory Visit: Payer: Self-pay

## 2019-03-27 VITALS — BP 130/80 | HR 59 | Temp 97.3°F | Wt 269.0 lb

## 2019-03-27 DIAGNOSIS — R7309 Other abnormal glucose: Secondary | ICD-10-CM | POA: Diagnosis not present

## 2019-03-27 DIAGNOSIS — E782 Mixed hyperlipidemia: Secondary | ICD-10-CM | POA: Diagnosis not present

## 2019-03-27 DIAGNOSIS — Z79899 Other long term (current) drug therapy: Secondary | ICD-10-CM | POA: Diagnosis not present

## 2019-03-27 DIAGNOSIS — F4323 Adjustment disorder with mixed anxiety and depressed mood: Secondary | ICD-10-CM

## 2019-03-27 DIAGNOSIS — I10 Essential (primary) hypertension: Secondary | ICD-10-CM | POA: Diagnosis not present

## 2019-03-27 DIAGNOSIS — E559 Vitamin D deficiency, unspecified: Secondary | ICD-10-CM

## 2019-03-27 DIAGNOSIS — M797 Fibromyalgia: Secondary | ICD-10-CM

## 2019-03-27 MED ORDER — LOSARTAN POTASSIUM-HCTZ 100-25 MG PO TABS: 1.0000 | ORAL_TABLET | Freq: Every day | ORAL | 1 refills | Status: DC

## 2019-03-27 NOTE — Patient Instructions (Addendum)
Goals    . Weight (lb) < 250 lb (113.4 kg)      Recommend weighing once weekly and keeping a log  Restart weight watchers program as discussed  Ok to try to stop wellbutrin - if mood/focus getting worse, restart  Switch to combo med for losartan and hydrochlorothiazide once you run out  Try to find good activities to distract from boredom  - a good book, calling a friend or family member, etc - have a plan for when this happens    Drink 1/2 your body weight in fluid ounces of water daily; drink a tall glass of water 30 min before meals  Don't eat until you're stuffed- listen to your stomach and eat until you are 80% full   Try eating off of a salad plate; wait 10 min after finishing before going back for seconds  Start by eating the vegetables on your plate; aim for 50% of your meals to be fruits or vegetables  Then eat your protein - lean meats (grass fed if possible), fish, beans, nuts in moderation  Eat your carbs/starch last ONLY if you still are hungry. If you can, stop before finishing it all  Avoid sugar and flour - the closer it looks to it's original form in nature, typically the better it is for you  Splurge in moderation - "assign" days when you get to splurge and have the "bad stuff" - I like to follow a 80% - 20% plan- "good" choices 80 % of the time, "bad" choices in moderation 20% of the time  Simple equation is: Calories out > calories in = weight loss - even if you eat the bad stuff, if you limit portions, you will still lose weight

## 2019-03-28 LAB — CBC WITH DIFFERENTIAL/PLATELET
Absolute Monocytes: 402 cells/uL (ref 200–950)
Basophils Absolute: 37 cells/uL (ref 0–200)
Basophils Relative: 0.5 %
Eosinophils Absolute: 88 cells/uL (ref 15–500)
Eosinophils Relative: 1.2 %
HCT: 40.4 % (ref 35.0–45.0)
Hemoglobin: 13.2 g/dL (ref 11.7–15.5)
Lymphs Abs: 1745 cells/uL (ref 850–3900)
MCH: 27.7 pg (ref 27.0–33.0)
MCHC: 32.7 g/dL (ref 32.0–36.0)
MCV: 84.9 fL (ref 80.0–100.0)
MPV: 11.8 fL (ref 7.5–12.5)
Monocytes Relative: 5.5 %
Neutro Abs: 5030 cells/uL (ref 1500–7800)
Neutrophils Relative %: 68.9 %
Platelets: 282 10*3/uL (ref 140–400)
RBC: 4.76 10*6/uL (ref 3.80–5.10)
RDW: 14 % (ref 11.0–15.0)
Total Lymphocyte: 23.9 %
WBC: 7.3 10*3/uL (ref 3.8–10.8)

## 2019-03-28 LAB — TSH: TSH: 2.06 mIU/L (ref 0.40–4.50)

## 2019-03-28 LAB — LIPID PANEL
Cholesterol: 186 mg/dL (ref <200)
HDL: 58 mg/dL (ref >=50)
LDL Cholesterol (Calc): 109 mg/dL (calc) — ABNORMAL HIGH
Non-HDL Cholesterol (Calc): 128 mg/dL (calc) (ref <130)
Total CHOL/HDL Ratio: 3.2 (calc) (ref <5.0)
Triglycerides: 101 mg/dL (ref <150)

## 2019-03-28 LAB — COMPLETE METABOLIC PANEL WITH GFR
AG Ratio: 1.4 (calc) (ref 1.0–2.5)
ALT: 16 U/L (ref 6–29)
AST: 18 U/L (ref 10–35)
Albumin: 3.9 g/dL (ref 3.6–5.1)
Alkaline phosphatase (APISO): 68 U/L (ref 37–153)
BUN: 14 mg/dL (ref 7–25)
CO2: 27 mmol/L (ref 20–32)
Calcium: 9.3 mg/dL (ref 8.6–10.4)
Chloride: 104 mmol/L (ref 98–110)
Creat: 0.71 mg/dL (ref 0.50–0.99)
GFR, Est African American: 105 mL/min/{1.73_m2} (ref >=60)
GFR, Est Non African American: 91 mL/min/{1.73_m2} (ref >=60)
Globulin: 2.7 g/dL (calc) (ref 1.9–3.7)
Glucose, Bld: 90 mg/dL (ref 65–99)
Potassium: 4.4 mmol/L (ref 3.5–5.3)
Sodium: 139 mmol/L (ref 135–146)
Total Bilirubin: 0.3 mg/dL (ref 0.2–1.2)
Total Protein: 6.6 g/dL (ref 6.1–8.1)

## 2019-03-28 LAB — HEMOGLOBIN A1C
Hgb A1c MFr Bld: 5.9 % of total Hgb — ABNORMAL HIGH (ref <5.7)
Mean Plasma Glucose: 123 (calc)
eAG (mmol/L): 6.8 (calc)

## 2019-03-28 LAB — MAGNESIUM: Magnesium: 2.1 mg/dL (ref 1.5–2.5)

## 2019-03-28 LAB — VITAMIN D 25 HYDROXY (VIT D DEFICIENCY, FRACTURES): Vit D, 25-Hydroxy: 40 ng/mL (ref 30–100)

## 2019-05-01 ENCOUNTER — Other Ambulatory Visit: Payer: Self-pay | Admitting: *Deleted

## 2019-05-01 MED ORDER — LOSARTAN POTASSIUM-HCTZ 100-25 MG PO TABS: 1.0000 | ORAL_TABLET | Freq: Every day | ORAL | 1 refills | Status: DC

## 2019-06-13 ENCOUNTER — Ambulatory Visit
Admission: RE | Admit: 2019-06-13 | Discharge: 2019-06-13 | Disposition: A | Discharge status: Home / Self Care | Payer: BC Managed Care – PPO | Source: Ambulatory Visit | Attending: Physician Assistant | Admitting: Physician Assistant

## 2019-06-13 ENCOUNTER — Other Ambulatory Visit: Payer: Self-pay

## 2019-06-13 ENCOUNTER — Other Ambulatory Visit: Payer: Self-pay | Admitting: Physician Assistant

## 2019-06-13 DIAGNOSIS — N6489 Other specified disorders of breast: Secondary | ICD-10-CM | POA: Diagnosis not present

## 2019-06-13 DIAGNOSIS — N63 Unspecified lump in unspecified breast: Secondary | ICD-10-CM

## 2019-06-13 DIAGNOSIS — R928 Other abnormal and inconclusive findings on diagnostic imaging of breast: Secondary | ICD-10-CM | POA: Diagnosis not present

## 2019-07-06 ENCOUNTER — Other Ambulatory Visit: Payer: Self-pay | Admitting: Internal Medicine

## 2019-07-24 NOTE — Progress Notes (Deleted)
Assessment and Plan:   Essential hypertension, benign - continue medications, DASH diet, exercise and monitor at home. Call if greater than 130/80.  - Go to the ER if any CP, SOB, nausea, dizziness, severe HA, changes vision/speech -     CBC with Differential/Platelet -     CMP/GFR -     TSH  Fibromyalgia Continue  cymbalta, start pool exercises, continue amitriptyline at night  Mixed hyperlipidemia -continue medications, check lipids, decrease fatty foods, increase activity.  -     Lipid panel  Adjustment disorder with mixed anxiety and depressed mood Fairly managed by current regimen; she didn't feel much benefit with addition of wellbutrin, she will trial stopping for 2 weeks and evaluate Stress management techniques discussed, increase water, good sleep hygiene discussed, increase exercise, and increase veggies.   Prediabetes Discussed disease and risks Discussed diet/exercise, weight management  A1C at CPE; monitor weight, check serum glucose  Medication management -     Magnesium -     CMP/GFR  Vitamin D deficiency Continue supplementation, *** Defer vitamin D level  Morbid obesity/ hx of gastric bypass Long discussion about weight loss, diet, and exercise Recommended diet heavy in fruits and veggies and low in animal meats, cheeses, and dairy products, appropriate calorie intake Patient will work on having a plan for mindful eating, have a plan for managing stress, restart weight watchers program which has worked well before, weigh weekly  Discussed appropriate weight for height and initial goal (<250lb) Follow up at next visit   Continue diet and meds as discussed. Further disposition pending results of labs.  Future Appointments  Date Time Provider Section  07/26/2019  9:30 AM Liane Comber, NP [facility] None  12/04/2019 10:00 AM Vicie Mutters, PA-C [facility] None  12/18/2019  8:20 AM [facility] 1 [facility]  12/18/2019  8:30 AM  [facility] [place] 1 [facility]     HPI  63 y.o. female  presents for 3 month follow up with hypertension, hyperlipidemia, prediabetes and vitamin D.  She has FM and depression, has had negative autoimmune work up and has seen rheum, she is on cymbalta, amitryptiline and wellbutrin and doing fairly. She reports wellbutrin was added for focus (? Weight benefit as well), she is unsure if she has noted any improvement since this addition. She is on valium 1-2 x a month.  Last script refilled over a year ago.   BMI is There is no height or weight on file to calculate BMI., she has not been working on diet and exercise very stressed with covid 19, admits getting bored and will splurge on multiple brownies in the evening. She was given information about mini habits, emotional eating, and time. She plans to restart weight watchers. *** She drinks tea, diet soda all day. She does have a glass of water after each soda. She drinks 100+ fluid ounces daily. She walks her dog around the block twice a day, but her work is reportedly quite active, moving furniture and boxes on daily basis.  Wt Readings from Last 3 Encounters:  03/27/19 269 lb (122 kg)  11/22/18 250 lb 12.8 oz (113.8 kg)  02/16/18 261 lb (118.4 kg)    Her blood pressure has been controlled at home, today their BP is      She does not workout. She denies chest pain, shortness of breath, dizziness.     She is not on cholesterol medication and denies myalgias. Her cholesterol is not at goal. The  cholesterol last visit was:   Lab Results  Component Value Date   CHOL 186 03/27/2019   HDL 58 03/27/2019   LDLCALC 109 (H) 03/27/2019   TRIG 101 03/27/2019   CHOLHDL 3.2 03/27/2019    She has not been working on diet and exercise for prediabetes, and denies foot ulcerations, hyperglycemia, hypoglycemia , increased appetite, nausea, paresthesia of the feet, polydipsia, polyuria, visual disturbances, vomiting and weight loss. Last A1C in the office  was:  Lab Results  Component Value Date   HGBA1C 5.9 (H) 03/27/2019   Patient is on Vitamin D supplement, was off of vitamin D  Lab Results  Component Value Date   VD25OH 40 03/27/2019       Current Medications:  Current Outpatient Medications on File Prior to Visit  Medication Sig Dispense Refill  . amitriptyline (ELAVIL) 50 MG tablet TAKE 1 TO 2 TABLETS BY MOUTH AT BEDTIME AS NEEDED FOR SLEEP 180 tablet 0  . atenolol (TENORMIN) 50 MG tablet TAKE 1 TABLET BY MOUTH DAILY. 90 tablet 1  . buPROPion (WELLBUTRIN XL) 150 MG 24 hr tablet TAKE 1 TABLET BY MOUTH EVERY DAY IN THE MORNING 90 tablet 2  . Calcium Carbonate-Vit D-Min (CALCIUM 1200 PO) Take by mouth daily.    . diclofenac sodium (VOLTAREN) 1 % GEL Apply 4 g topically 4 (four) times daily. (Patient taking differently: Apply 4 g topically as needed. ) 100 g 3  . DULoxetine (CYMBALTA) 60 MG capsule TAKE 1 CAPSULE BY MOUTH EVERY DAY 90 capsule 1  . losartan-hydrochlorothiazide (HYZAAR) 100-25 MG tablet Take 1 tablet by mouth daily. 90 tablet 1  . Multiple Vitamins-Minerals (MULTIVITAMIN WITH MINERALS) tablet Take 1 tablet by mouth daily.     No current facility-administered medications on file prior to visit.     Medical History:  Past Medical History:  Diagnosis Date  . Adjustment disorder with mixed anxiety and depressed mood 11/25/2013  . Fibromyalgia   . H/O gastric bypass   . Mixed hyperlipidemia 11/25/2013    Allergies:  Allergies  Allergen Reactions  . E-Mycin [Erythromycin]     GI upset     Review of Systems:  Review of Systems  Constitutional: Negative for chills, fever and malaise/fatigue.  HENT: Negative for congestion, ear pain and sore throat.   Eyes: Negative.   Respiratory: Negative for cough, shortness of breath and wheezing.   Cardiovascular: Negative for chest pain, palpitations and leg swelling.  Gastrointestinal: Negative for abdominal pain, blood in stool, constipation, diarrhea, heartburn and melena.   Genitourinary: Negative.   Musculoskeletal: Negative for joint pain.  Skin: Negative for rash.  Neurological: Negative for dizziness, sensory change, loss of consciousness and headaches.  Psychiatric/Behavioral: Negative for depression. The patient is nervous/anxious. The patient does not have insomnia.     Family history- Review and unchanged  Social history- Review and unchanged  Physical Exam: There were no vitals taken for this visit. Wt Readings from Last 3 Encounters:  03/27/19 269 lb (122 kg)  11/22/18 250 lb 12.8 oz (113.8 kg)  02/16/18 261 lb (118.4 kg)    General Appearance: Well nourished well developed, in no apparent distress. Eyes: PERRLA, EOMs, conjunctiva no swelling or erythema ENT/Mouth: Ear canals normal without obstruction, swelling, erythma, discharge.  TMs normal bilaterally.  Oropharynx moist, clear, without exudate, or postoropharyngeal swelling. Neck: Supple, thyroid normal,no cervical adenopathy  Respiratory: Respiratory effort normal, Breath sounds clear A&P without rhonchi, wheeze, or rale.  No retractions, no accessory usage. Cardio: RRR with  no MRGs. Brisk peripheral pulses without edema.  Abdomen: Soft, + BS,  Non tender, no guarding, rebound, hernias, masses. Musculoskeletal: Full ROM, 5/5 strength, Normal gait,  Skin: Warm, dry without rashes, lesions, ecchymosis.  Neuro: Awake and oriented X 3, Cranial nerves intact. Normal muscle tone, no cerebellar symptoms. Psych: Normal affect, Insight and Judgment appropriate.    Nicole Ribas, NP 2:07 PM [facility]

## 2019-07-26 ENCOUNTER — Ambulatory Visit: Payer: BC Managed Care – PPO | Admitting: Adult Health

## 2019-07-31 ENCOUNTER — Other Ambulatory Visit: Payer: Self-pay | Admitting: Adult Health

## 2019-07-31 DIAGNOSIS — I10 Essential (primary) hypertension: Secondary | ICD-10-CM

## 2019-08-09 ENCOUNTER — Ambulatory Visit: Payer: BC Managed Care – PPO | Admitting: Adult Health

## 2019-08-24 ENCOUNTER — Other Ambulatory Visit: Payer: Self-pay

## 2019-08-24 MED ORDER — AMITRIPTYLINE HCL 50 MG PO TABS: ORAL_TABLET | ORAL | 0 refills | Status: DC

## 2019-08-27 NOTE — Progress Notes (Signed)
Assessment and Plan:   Essential hypertension, benign - continue medications, DASH diet, exercise and monitor at home. Call if greater than 130/80.  - Go to the ER if any CP, SOB, nausea, dizziness, severe HA, changes vision/speech -     CBC with Differential/Platelet -     CMP/GFR -     TSH  Fibromyalgia Continue cymbalta, consider pool exercises, continue amitriptyline at night  Mixed hyperlipidemia -continue medications, check lipids, decrease fatty foods, increase activity.  -     Lipid panel  Adjustment disorder with mixed anxiety and depressed mood Fairly managed by current regimen;  Stress management techniques discussed, increase water, good sleep hygiene discussed, increase exercise, and increase veggies.   Prediabetes Discussed diet/exercise, weight management  -     Hemoglobin A1c  Medication management -     Magnesium  Vitamin D deficiency Continue supplementation Defer vitamin D level  Morbid obesity (BMI 45) Long discussion about weight loss, diet, and exercise Recommended diet heavy in fruits and veggies and low in animal meats, cheeses, and dairy products, appropriate calorie intake Patient will continue with mindful eating, tracking calories and macros, weighing once a week  Discussed appropriate weight for height and initial goal (<250lb), long term goal of <175lb Follow up at next visit  Need for influenza vaccine  - Quad valent administered without complication   Continue diet and meds as discussed. Further disposition pending results of labs.  Future Appointments  Date Time Provider Clinch  12/04/2019 10:00 AM Vicie Mutters, PA-C [facility] None  12/18/2019  8:20 AM [facility] 1 [facility]  12/18/2019  8:30 AM [facility] [place] 1 [facility]     HPI  63 y.o. female  presents for 3 month follow up with hypertension, hyperlipidemia, prediabetes and vitamin D.  She has FM and depression, has had negative autoimmune  work up and has seen rheum, she is on cymbalta, amitryptiline; she is off of wellbutrin and hasn't noted much difference.   BMI is Body mass index is 45.35 kg/m., she has not been working on diet and exercise very stressed with covid 19, admits getting bored and will splurge on multiple brownies in the evening. She was given information about mini habits, emotional eating, and time. She was up to 277 lb and down to 256 lb. She is considering weight watchers or noom. Counting calories and macros, using a fit bit.  She drinks 100+ fluid ounces daily. She walks her dog around the block twice a day, but her work is reportedly quite active, moving furniture and boxes on daily basis. She would like to get down to 175 lb. She did have gastric bypass in 2006 with 170 lb weight loss down to 135 lb.  Wt Readings from Last 3 Encounters:  08/29/19 256 lb (116.1 kg)  03/27/19 269 lb (122 kg)  11/22/18 250 lb 12.8 oz (113.8 kg)    Her blood pressure has been controlled at home, today their BP is BP: 134/82    She does workout. She denies chest pain, shortness of breath, dizziness.     She is not on cholesterol medication and denies myalgias. Her cholesterol is not at goal. The cholesterol last visit was:   Lab Results  Component Value Date   CHOL 186 03/27/2019   HDL 58 03/27/2019   LDLCALC 109 (H) 03/27/2019   TRIG 101 03/27/2019   CHOLHDL 3.2 03/27/2019    She has not been working on diet and exercise for  prediabetes, and denies foot ulcerations, hyperglycemia, hypoglycemia , increased appetite, nausea, paresthesia of the feet, polydipsia, polyuria, visual disturbances, vomiting and weight loss. Last A1C in the office was:  Lab Results  Component Value Date   HGBA1C 5.9 (H) 03/27/2019   Lab Results  Component Value Date   GFRNONAA 91 03/27/2019   Patient is on Vitamin D supplement, was off of vitamin D at last check Lab Results  Component Value Date   VD25OH 40 03/27/2019       Current  Medications:  Current Outpatient Medications on File Prior to Visit  Medication Sig Dispense Refill  . amitriptyline (ELAVIL) 50 MG tablet TAKE 1 TO 2 TABLETS BY MOUTH AT BEDTIME AS NEEDED FOR SLEEP 180 tablet 0  . atenolol (TENORMIN) 50 MG tablet Take 1 tablet Daily for BP 90 tablet 1  . Calcium Carbonate-Vit D-Min (CALCIUM 1200 PO) Take by mouth daily.    . DULoxetine (CYMBALTA) 60 MG capsule TAKE 1 CAPSULE BY MOUTH EVERY DAY 90 capsule 1  . losartan-hydrochlorothiazide (HYZAAR) 100-25 MG tablet Take 1 tablet by mouth daily. 90 tablet 1  . Multiple Vitamins-Minerals (MULTIVITAMIN WITH MINERALS) tablet Take 1 tablet by mouth daily.    Marland Kitchen buPROPion (WELLBUTRIN XL) 150 MG 24 hr tablet TAKE 1 TABLET BY MOUTH EVERY DAY IN THE MORNING 90 tablet 2  . diclofenac sodium (VOLTAREN) 1 % GEL Apply 4 g topically 4 (four) times daily. (Patient taking differently: Apply 4 g topically as needed. ) 100 g 3   No current facility-administered medications on file prior to visit.     Medical History:  Past Medical History:  Diagnosis Date  . Adjustment disorder with mixed anxiety and depressed mood 11/25/2013  . Fibromyalgia   . H/O gastric bypass   . Mixed hyperlipidemia 11/25/2013    Allergies:  Allergies  Allergen Reactions  . E-Mycin [Erythromycin]     GI upset     Review of Systems:  Review of Systems  Constitutional: Negative for chills, fever and malaise/fatigue.  HENT: Negative for congestion, ear pain and sore throat.   Eyes: Negative.   Respiratory: Negative for cough, shortness of breath and wheezing.   Cardiovascular: Negative for chest pain, palpitations and leg swelling.  Gastrointestinal: Negative for abdominal pain, blood in stool, constipation, diarrhea, heartburn and melena.  Genitourinary: Negative.   Musculoskeletal: Negative for joint pain.  Skin: Negative for rash.  Neurological: Negative for dizziness, sensory change, loss of consciousness and headaches.   Psychiatric/Behavioral: Negative for depression. The patient is nervous/anxious. The patient does not have insomnia.     Family history- Review and unchanged  Social history- Review and unchanged  Physical Exam: BP 134/82   Pulse 67   Temp (!) 97.5 F (36.4 C)   Wt 256 lb (116.1 kg)   SpO2 99%   BMI 45.35 kg/m  Wt Readings from Last 3 Encounters:  08/29/19 256 lb (116.1 kg)  03/27/19 269 lb (122 kg)  11/22/18 250 lb 12.8 oz (113.8 kg)    General Appearance: Well nourished well developed, in no apparent distress. Eyes: PERRLA, EOMs, conjunctiva no swelling or erythema ENT/Mouth: Ear canals normal without obstruction, swelling, erythma, discharge.  TMs normal bilaterally.  Oropharynx moist, clear, without exudate, or postoropharyngeal swelling. Neck: Supple, thyroid normal,no cervical adenopathy  Respiratory: Respiratory effort normal, Breath sounds clear A&P without rhonchi, wheeze, or rale.  No retractions, no accessory usage. Cardio: RRR with no MRGs. Brisk peripheral pulses without edema.  Abdomen: Soft, + BS,  Non tender, no guarding, rebound, hernias, masses. Musculoskeletal: Full ROM, 5/5 strength, Normal gait,  Skin: Warm, dry without rashes, lesions, ecchymosis.  Neuro: Awake and oriented X 3, Cranial nerves intact. Normal muscle tone, no cerebellar symptoms. Psych: Normal affect, Insight and Judgment appropriate.    Izora Ribas, NP 9:55 AM [facility]

## 2019-08-29 ENCOUNTER — Ambulatory Visit: Payer: BC Managed Care – PPO | Admitting: Adult Health

## 2019-08-29 ENCOUNTER — Encounter: Payer: Self-pay | Admitting: Adult Health

## 2019-08-29 ENCOUNTER — Other Ambulatory Visit: Payer: Self-pay

## 2019-08-29 VITALS — BP 134/82 | HR 67 | Temp 97.5°F | Wt 256.0 lb

## 2019-08-29 DIAGNOSIS — Z79899 Other long term (current) drug therapy: Secondary | ICD-10-CM

## 2019-08-29 DIAGNOSIS — Z23 Encounter for immunization: Secondary | ICD-10-CM

## 2019-08-29 DIAGNOSIS — F4323 Adjustment disorder with mixed anxiety and depressed mood: Secondary | ICD-10-CM

## 2019-08-29 DIAGNOSIS — E782 Mixed hyperlipidemia: Secondary | ICD-10-CM | POA: Diagnosis not present

## 2019-08-29 DIAGNOSIS — I10 Essential (primary) hypertension: Secondary | ICD-10-CM | POA: Diagnosis not present

## 2019-08-29 DIAGNOSIS — Z9884 Bariatric surgery status: Secondary | ICD-10-CM | POA: Diagnosis not present

## 2019-08-29 DIAGNOSIS — R7309 Other abnormal glucose: Secondary | ICD-10-CM

## 2019-08-29 DIAGNOSIS — E559 Vitamin D deficiency, unspecified: Secondary | ICD-10-CM | POA: Diagnosis not present

## 2019-08-29 MED ORDER — DULOXETINE HCL 60 MG PO CPEP: ORAL_CAPSULE | ORAL | 2 refills | Status: DC

## 2019-08-29 NOTE — Patient Instructions (Addendum)
Goals    . Weight (lb) < 250 lb (113.4 kg)       Look into vionix shoes     Preventing High Cholesterol Cholesterol is a white, waxy substance similar to fat that the human body needs to help build cells. The liver makes all the cholesterol that a person's body needs. Having high cholesterol (hypercholesterolemia) increases a person's risk for heart disease and stroke. Extra (excess) cholesterol comes from the food the person eats. High cholesterol can often be prevented with diet and lifestyle changes. If you already have high cholesterol, you can control it with diet and lifestyle changes and with medicine. How can high cholesterol affect me? If you have high cholesterol, deposits (plaques) may build up on the walls of your arteries. The arteries are the blood vessels that carry blood away from your heart. Plaques make the arteries narrower and stiffer. This can limit or block blood flow and cause blood clots to form. Blood clots:  Are tiny balls of cells that form in your blood.  Can move to the heart or brain, causing a heart attack or stroke. Plaques in arteries greatly increase your risk for heart attack and stroke.Making diet and lifestyle changes can reduce your risk for these conditions that may threaten your life. What can increase my risk? This condition is more likely to develop in people who:  Eat foods that are high in saturated fat or cholesterol. Saturated fat is mostly found in: ? Foods that contain animal fat, such as red meat and some dairy products. ? Certain fatty foods made from plants, such as tropical oils.  Are overweight.  Are not getting enough exercise.  Have a family history of high cholesterol. What actions can I take to prevent this? Nutrition   Eat less saturated fat.  Avoid trans fats (partially hydrogenated oils). These are often found in margarine and in some baked goods, fried foods, and snacks bought in packages.  Avoid precooked or cured  meat, such as sausages or meat loaves.  Avoid foods and drinks that have added sugars.  Eat more fruits, vegetables, and whole grains.  Choose healthy sources of protein, such as fish, poultry, lean cuts of red meat, beans, peas, lentils, and nuts.  Choose healthy sources of fat, such as: ? Nuts. ? Vegetable oils, especially olive oil. ? Fish that have healthy fats (omega-3 fatty acids), such as mackerel or salmon. The items listed above may not be a complete list of recommended foods and beverages. Contact a dietitian for more information. Lifestyle  Lose weight if you are overweight. Losing 5-10 lb (2.3-4.5 kg) can help prevent or control high cholesterol. It can also lower your risk for diabetes and high blood pressure. Ask your health care provider to help you with a diet and exercise plan to lose weight safely.  Do not use any products that contain nicotine or tobacco, such as cigarettes, e-cigarettes, and chewing tobacco. If you need help quitting, ask your health care provider.  Limit your alcohol intake. ? Do not drink alcohol if:  Your health care provider tells you not to drink.  You are pregnant, may be pregnant, or are planning to become pregnant. ? If you drink alcohol:  Limit how much you use to:  0-1 drink a day for women.  0-2 drinks a day for men.  Be aware of how much alcohol is in your drink. In the [place] one drink equals one 12 oz bottle of beer (355 mL), one 5  oz glass of wine (148 mL), or one 1 oz glass of hard liquor (44 mL). Activity   Get enough exercise. Each week, do at least 150 minutes of exercise that takes a medium level of effort (moderate-intensity exercise). ? This is exercise that:  Makes your heart beat faster and makes you breathe harder than usual.  Allows you to still be able to talk. ? You could exercise in short sessions several times a day or longer sessions a few times a week. For example, on 5 days each week, you could walk fast  or ride your bike 3 times a day for 10 minutes each time.  Do exercises as told by your health care provider. Medicines  In addition to diet and lifestyle changes, your health care provider may recommend medicines to help lower cholesterol. This may be a medicine to lower the amount of cholesterol your liver makes. You may need medicine if: ? Diet and lifestyle changes do not lower your cholesterol enough. ? You have high cholesterol and other risk factors for heart disease or stroke.  Take over-the-counter and prescription medicines only as told by your health care provider. General information  Manage your risk factors for high cholesterol. Talk with your health care provider about all your risk factors and how to lower your risk.  Manage other conditions that you have, such as diabetes or high blood pressure (hypertension).  Have blood tests to check your cholesterol levels at regular points in time as told by your health care provider.  Keep all follow-up visits as told by your health care provider. This is important. Where to find more information  American Heart Association: www.heart.org  National Heart, Lung, and Blood Institute: https://wilson-eaton.com/ Summary  High cholesterol increases your risk for heart disease and stroke. By keeping your cholesterol level low, you can reduce your risk for these conditions.  High cholesterol can often be prevented with diet and lifestyle changes.  Work with your health care provider to manage your risk factors, and have your blood tested regularly. This information is not intended to replace advice given to you by your health care provider. Make sure you discuss any questions you have with your health care provider. Document Released: 10/19/2015 Document Revised: 01/26/2019 Document Reviewed: 06/12/2016 Elsevier Patient Education  2020 [employer].

## 2019-08-30 LAB — LIPID PANEL
Cholesterol: 155 mg/dL (ref <200)
HDL: 53 mg/dL (ref >=50)
LDL Cholesterol (Calc): 82 mg/dL (calc)
Non-HDL Cholesterol (Calc): 102 mg/dL (calc) (ref <130)
Total CHOL/HDL Ratio: 2.9 (calc) (ref <5.0)
Triglycerides: 102 mg/dL (ref <150)

## 2019-08-30 LAB — HEMOGLOBIN A1C
Hgb A1c MFr Bld: 5.9 % of total Hgb — ABNORMAL HIGH (ref <5.7)
Mean Plasma Glucose: 123 (calc)
eAG (mmol/L): 6.8 (calc)

## 2019-08-30 LAB — CBC WITH DIFFERENTIAL/PLATELET
Absolute Monocytes: 451 cells/uL (ref 200–950)
Basophils Absolute: 33 cells/uL (ref 0–200)
Basophils Relative: 0.4 %
Eosinophils Absolute: 49 cells/uL (ref 15–500)
Eosinophils Relative: 0.6 %
HCT: 41.4 % (ref 35.0–45.0)
Hemoglobin: 13.4 g/dL (ref 11.7–15.5)
Lymphs Abs: 1771 cells/uL (ref 850–3900)
MCH: 28.1 pg (ref 27.0–33.0)
MCHC: 32.4 g/dL (ref 32.0–36.0)
MCV: 86.8 fL (ref 80.0–100.0)
MPV: 12.3 fL (ref 7.5–12.5)
Monocytes Relative: 5.5 %
Neutro Abs: 5896 cells/uL (ref 1500–7800)
Neutrophils Relative %: 71.9 %
Platelets: 288 10*3/uL (ref 140–400)
RBC: 4.77 10*6/uL (ref 3.80–5.10)
RDW: 14.5 % (ref 11.0–15.0)
Total Lymphocyte: 21.6 %
WBC: 8.2 10*3/uL (ref 3.8–10.8)

## 2019-08-30 LAB — COMPLETE METABOLIC PANEL WITH GFR
AG Ratio: 1.7 (calc) (ref 1.0–2.5)
ALT: 21 U/L (ref 6–29)
AST: 21 U/L (ref 10–35)
Albumin: 4.3 g/dL (ref 3.6–5.1)
Alkaline phosphatase (APISO): 57 U/L (ref 37–153)
BUN: 20 mg/dL (ref 7–25)
CO2: 27 mmol/L (ref 20–32)
Calcium: 9.7 mg/dL (ref 8.6–10.4)
Chloride: 105 mmol/L (ref 98–110)
Creat: 0.7 mg/dL (ref 0.50–0.99)
GFR, Est African American: 107 mL/min/{1.73_m2} (ref >=60)
GFR, Est Non African American: 92 mL/min/{1.73_m2} (ref >=60)
Globulin: 2.5 g/dL (calc) (ref 1.9–3.7)
Glucose, Bld: 92 mg/dL (ref 65–99)
Potassium: 4.9 mmol/L (ref 3.5–5.3)
Sodium: 141 mmol/L (ref 135–146)
Total Bilirubin: 0.3 mg/dL (ref 0.2–1.2)
Total Protein: 6.8 g/dL (ref 6.1–8.1)

## 2019-08-30 LAB — VITAMIN D 25 HYDROXY (VIT D DEFICIENCY, FRACTURES): Vit D, 25-Hydroxy: 35 ng/mL (ref 30–100)

## 2019-08-30 LAB — TSH: TSH: 1.16 mIU/L (ref 0.40–4.50)

## 2019-08-30 LAB — MAGNESIUM: Magnesium: 2.1 mg/dL (ref 1.5–2.5)

## 2019-09-27 ENCOUNTER — Encounter: Payer: Self-pay | Admitting: Physician Assistant

## 2019-10-29 ENCOUNTER — Other Ambulatory Visit: Payer: Self-pay | Admitting: Adult Health

## 2019-12-03 NOTE — Progress Notes (Deleted)
COMPLETE PHYSICAL  Assessment and Plan:   Encounter for general adult medical examination with abnormal findings 1 year Will get MGM, colonoscopy and PAP done today  Essential hypertension, benign - continue medications, DASH diet, exercise and monitor at home. Call if greater than 130/80.  -     CBC with Differential/Platelet -     COMPLETE METABOLIC PANEL WITH GFR -     TSH -     EKG 12-Lead -     Urinalysis, Routine w reflex microscopic -     Microalbumin / creatinine urine ratio  Mixed hyperlipidemia check lipids decrease fatty foods increase activity.  -     Lipid panel  Morbid obesity ([place]) - follow up 3 months for progress monitoring - increase veggies, decrease carbs - long discussion about weight loss, diet, and exercise Continue weight watchers and exercise  Prediabetes -     Hemoglobin A1c Discussed disease progression and risks Discussed diet/exercise, weight management and risk modification  Fibromyalgia Continue medications  H/O gastric bypass Monitor levels  Adjustment disorder with mixed anxiety and depressed mood Continue medications, well controlled  Medication management -     Magnesium  Vitamin D deficiency -     VITAMIN D 25 Hydroxy (Vit-D Deficiency, Fractures)  Screen for colon cancer -     Ambulatory referral to Gastroenterology   Continue diet and meds as discussed. Further disposition pending results of labs.  HPI  64 y.o. female  presents for CPE and 3 month follow up with hypertension, hyperlipidemia, prediabetes and vitamin D.  She has FM and depression, has had negative autoimmune work up and has seen rheum, she is on cymbalta and wellbutrin and doing well. She is on valium 1-2 x a month.   BMI is There is no height or weight on file to calculate BMI., she is working on diet and exercise. She was given information about mini habits, emotional eating. She is doing weight watchers, and going to start working out again video.  Wt  Readings from Last 3 Encounters:  08/29/19 256 lb (116.1 kg)  03/27/19 269 lb (122 kg)  11/22/18 250 lb 12.8 oz (113.8 kg)     Her blood pressure has been controlled at home, today their BP is     She does not workout. She denies chest pain, shortness of breath, dizziness.    She is on cholesterol medication and denies myalgias. Her cholesterol is at goal. The cholesterol last visit was:   Lab Results  Component Value Date   CHOL 155 08/29/2019   HDL 53 08/29/2019   LDLCALC 82 08/29/2019   TRIG 102 08/29/2019   CHOLHDL 2.9 08/29/2019    She has not been working on diet and exercise for prediabetes, and denies foot ulcerations, hyperglycemia, hypoglycemia , increased appetite, nausea, paresthesia of the feet, polydipsia, polyuria, visual disturbances, vomiting and weight loss. Last A1C in the office was:  Lab Results  Component Value Date   HGBA1C 5.9 (H) 08/29/2019   Patient is on Vitamin D supplement.  Lab Results  Component Value Date   VD25OH 35 08/29/2019      Current Medications:  Current Outpatient Medications on File Prior to Visit  Medication Sig Dispense Refill  . amitriptyline (ELAVIL) 50 MG tablet TAKE 1 TO 2 TABLETS BY MOUTH AT BEDTIME AS NEEDED FOR SLEEP 180 tablet 0  . atenolol (TENORMIN) 50 MG tablet Take 1 tablet Daily for BP 90 tablet 1  . Calcium Carbonate-Vit D-Min (CALCIUM 1200 PO)  Take by mouth daily.    . DULoxetine (CYMBALTA) 60 MG capsule TAKE 1 CAPSULE BY MOUTH EVERY DAY 90 capsule 2  . losartan-hydrochlorothiazide (HYZAAR) 100-25 MG tablet Take 1 tablet Daily for BP 90 tablet 1  . Multiple Vitamins-Minerals (MULTIVITAMIN WITH MINERALS) tablet Take 1 tablet by mouth daily.     No current facility-administered medications on file prior to visit.    Medical History:  Past Medical History:  Diagnosis Date  . Adjustment disorder with mixed anxiety and depressed mood 11/25/2013  . Fibromyalgia   . H/O gastric bypass   . Mixed hyperlipidemia 11/25/2013    Immunization History  Administered Date(s) Administered  . Influenza Inj Mdck Quad With Preservative 08/29/2019  . Pneumococcal-Unspecified 01/01/2004  . Td 10/18/2000  . Tdap 07/05/2013  . Zoster 06/03/2017   MGM 2003- OVERDUE Colonoscopy- NEVER- after counseling will get colonoscopy PAP- never abnormal- LONG time ago Influenza get at pharmacy  Sleep study 08/2017- NEGATIVE SLEEP STUDY Echo 2006 Eye exam 09/2018 CXR 2008  Review of Systems:  Review of Systems  Constitutional: Negative for chills, fever and malaise/fatigue.  HENT: Negative for congestion, ear pain and sore throat.   Eyes: Negative.   Respiratory: Negative for cough, shortness of breath and wheezing.   Cardiovascular: Negative for chest pain, palpitations and leg swelling.  Gastrointestinal: Negative for abdominal pain, blood in stool, constipation, diarrhea, heartburn and melena.  Genitourinary: Negative.   Musculoskeletal: Negative for joint pain.  Skin: Negative for rash.  Neurological: Negative for dizziness, sensory change, loss of consciousness and headaches.  Psychiatric/Behavioral: Positive for depression. The patient is nervous/anxious and has insomnia.    Allergies Allergies  Allergen Reactions  . E-Mycin [Erythromycin]     GI upset    SURGICAL HISTORY She  has a past surgical history that includes Gastric bypass; Tonsillectomy; Appendectomy; Wisdom tooth extraction; salivary stones removed; and Exploratory laparotomy (2006). FAMILY HISTORY Her family history includes Alzheimer's disease in her father; Emphysema in her mother; Heart disease in her father; Multiple sclerosis in her sister; Osteoporosis in her mother. SOCIAL HISTORY She  reports that she has never smoked. She has never used smokeless tobacco. She reports previous alcohol use. She reports that she does not use drugs.   Physical Exam: There were no vitals taken for this visit. Wt Readings from Last 3 Encounters:  08/29/19  256 lb (116.1 kg)  03/27/19 269 lb (122 kg)  11/22/18 250 lb 12.8 oz (113.8 kg)    General Appearance: Well nourished well developed, in no apparent distress. Eyes: PERRLA, EOMs, conjunctiva no swelling or erythema ENT/Mouth: Ear canals normal without obstruction, swelling, erythma, discharge.  TMs normal bilaterally.  Oropharynx moist, clear, without exudate, or postoropharyngeal swelling. Neck: Supple, thyroid normal,no cervical adenopathy  Respiratory: Respiratory effort normal, Breath sounds clear A&P without rhonchi, wheeze, or rale.  No retractions, no accessory usage. Cardio: RRR with no MRGs. Brisk peripheral pulses without edema.  Abdomen: Soft, + BS,  Non tender, no guarding, rebound, hernias, masses. Musculoskeletal: Full ROM, 5/5 strength, Normal gait,  Skin: Warm, dry without rashes, lesions, ecchymosis.  GYN VULVA: vulvar erythema, vulvar hypopigmentation, VAGINA: normal appearing vagina with normal color and discharge, no lesions, exam limited by body habitus, unable to visualize cervix well,  PAP: Pap smear done today, had bright red blood on the speculum and brush. Neuro: Awake and oriented X 3, Cranial nerves intact. Normal muscle tone, no cerebellar symptoms. Psych: Normal affect, Insight and Judgment appropriate.   EKG: sinus brady, PRWP,  no ST changes  Vicie Mutters, PA-C 6:54 AM [facility]

## 2019-12-04 ENCOUNTER — Encounter: Payer: Self-pay | Admitting: Physician Assistant

## 2019-12-18 ENCOUNTER — Other Ambulatory Visit: Payer: BC Managed Care – PPO

## 2019-12-23 ENCOUNTER — Other Ambulatory Visit: Payer: Self-pay

## 2019-12-24 MED ORDER — AMITRIPTYLINE HCL 50 MG PO TABS: ORAL_TABLET | ORAL | 0 refills | Status: DC

## 2019-12-31 ENCOUNTER — Other Ambulatory Visit: Payer: Self-pay | Admitting: Physician Assistant

## 2019-12-31 ENCOUNTER — Ambulatory Visit
Admission: RE | Admit: 2019-12-31 | Discharge: 2019-12-31 | Disposition: A | Discharge status: Home / Self Care | Payer: BC Managed Care – PPO | Source: Ambulatory Visit | Attending: Physician Assistant | Admitting: Physician Assistant

## 2019-12-31 ENCOUNTER — Other Ambulatory Visit: Payer: Self-pay

## 2019-12-31 DIAGNOSIS — R928 Other abnormal and inconclusive findings on diagnostic imaging of breast: Secondary | ICD-10-CM | POA: Diagnosis not present

## 2019-12-31 DIAGNOSIS — N63 Unspecified lump in unspecified breast: Secondary | ICD-10-CM

## 2019-12-31 DIAGNOSIS — N6001 Solitary cyst of right breast: Secondary | ICD-10-CM | POA: Diagnosis not present

## 2019-12-31 DIAGNOSIS — N6012 Diffuse cystic mastopathy of left breast: Secondary | ICD-10-CM | POA: Diagnosis not present

## 2019-12-31 DIAGNOSIS — N6002 Solitary cyst of left breast: Secondary | ICD-10-CM | POA: Diagnosis not present

## 2019-12-31 DIAGNOSIS — N6041 Mammary duct ectasia of right breast: Secondary | ICD-10-CM | POA: Diagnosis not present

## 2020-01-01 ENCOUNTER — Other Ambulatory Visit: Payer: Self-pay | Admitting: Adult Health

## 2020-01-01 MED ORDER — LOSARTAN POTASSIUM-HCTZ 100-25 MG PO TABS: ORAL_TABLET | ORAL | 0 refills | Status: DC

## 2020-04-30 NOTE — Progress Notes (Deleted)
COMPLETE PHYSICAL  Assessment and Plan:   Encounter for general adult medical examination with abnormal findings 1 year Will get MGM, colonoscopy  Essential hypertension, benign - continue medications, DASH diet, exercise and monitor at home. Call if greater than 130/80.  -     CBC with Differential/Platelet -     COMPLETE METABOLIC PANEL WITH GFR -     TSH -     EKG 12-Lead -     Urinalysis, Routine w reflex microscopic -     Microalbumin / creatinine urine ratio  Mixed hyperlipidemia check lipids decrease fatty foods increase activity.  -     Lipid panel  Morbid obesity ([place]) - follow up 3 months for progress monitoring - increase veggies, decrease carbs - long discussion about weight loss, diet, and exercise Continue weight watchers and exercise  Abnormal glucose -     Hemoglobin A1c Discussed disease progression and risks Discussed diet/exercise, weight management and risk modification  Fibromyalgia Continue medications  H/O gastric bypass Monitor levels  Adjustment disorder with mixed anxiety and depressed mood Continue medications, well controlled  Medication management -     Magnesium  Vitamin D deficiency -     VITAMIN D 25 Hydroxy (Vit-D Deficiency, Fractures)  Screen for colon cancer -     Ambulatory referral to Gastroenterology  Post-menopausal bleeding -     US PELVIC COMPLETE WITH TRANSVAGINAL; Future - blood on the brush/speculum, will get [place]   Continue diet and meds as discussed. Further disposition pending results of labs.  HPI  64 y.o. female  presents for CPE and OVERDUE follow up with hypertension, hyperlipidemia, prediabetes and vitamin D.  She has FM and depression, has had negative autoimmune work up and has seen rheum, she is on cymbalta and wellbutrin and doing well. She is on valium 1-2 x a month.  BMI is There is no height or weight on file to calculate BMI., she is working on diet and exercise. Wt Readings from Last 3  Encounters:  08/29/19 256 lb (116.1 kg)  03/27/19 269 lb (122 kg)  11/22/18 250 lb 12.8 oz (113.8 kg)     Her blood pressure has been controlled at home, today their BP is     She does not workout. She denies chest pain, shortness of breath, dizziness.    She is on cholesterol medication and denies myalgias. Her cholesterol is at goal. The cholesterol last visit was:   Lab Results  Component Value Date   CHOL 155 08/29/2019   HDL 53 08/29/2019   LDLCALC 82 08/29/2019   TRIG 102 08/29/2019   CHOLHDL 2.9 08/29/2019    She has not been working on diet and exercise for prediabetes, and denies foot ulcerations, hyperglycemia, hypoglycemia , increased appetite, nausea, paresthesia of the feet, polydipsia, polyuria, visual disturbances, vomiting and weight loss. Last A1C in the office was:  Lab Results  Component Value Date   HGBA1C 5.9 (H) 08/29/2019   Patient is on Vitamin D supplement.  Lab Results  Component Value Date   VD25OH 35 08/29/2019       Current Medications:    Current Outpatient Medications (Cardiovascular):  .  atenolol (TENORMIN) 50 MG tablet, Take 1 tablet Daily for BP .  losartan-hydrochlorothiazide (HYZAAR) 100-25 MG tablet, Take 1 tablet Daily for BP     Current Outpatient Medications (Other):  .  amitriptyline (ELAVIL) 50 MG tablet, TAKE 1 TO 2 TABLETS BY MOUTH AT BEDTIME AS NEEDED FOR SLEEP .  Calcium  Carbonate-Vit D-Min (CALCIUM 1200 PO), Take by mouth daily. .  DULoxetine (CYMBALTA) 60 MG capsule, TAKE 1 CAPSULE BY MOUTH EVERY DAY .  Multiple Vitamins-Minerals (MULTIVITAMIN WITH MINERALS) tablet, Take 1 tablet by mouth daily.  Medical History:  Past Medical History:  Diagnosis Date  . Adjustment disorder with mixed anxiety and depressed mood 11/25/2013  . Fibromyalgia   . H/O gastric bypass   . Mixed hyperlipidemia 11/25/2013   Immunization History  Administered Date(s) Administered  . Influenza Inj Mdck Quad With Preservative 08/29/2019  .  Pneumococcal-Unspecified 01/01/2004  . Td 10/18/2000  . Tdap 07/05/2013  . Zoster 06/03/2017   MGM 12/2019 Colonoscopy- NEVER- after counseling will get colonoscopy PAP- never abnormal- LONG time ago Influenza get at pharmacy  Sleep study 08/2017- NEGATIVE SLEEP STUDY Echo 2006 Eye exam 09/2018 CXR 2008  Review of Systems:  Review of Systems  Constitutional: Negative for chills, fever and malaise/fatigue.  HENT: Negative for congestion, ear pain and sore throat.   Eyes: Negative.   Respiratory: Negative for cough, shortness of breath and wheezing.   Cardiovascular: Negative for chest pain, palpitations and leg swelling.  Gastrointestinal: Negative for abdominal pain, blood in stool, constipation, diarrhea, heartburn and melena.  Genitourinary: Negative.   Musculoskeletal: Negative for joint pain.  Skin: Negative for rash.  Neurological: Negative for dizziness, sensory change, loss of consciousness and headaches.  Psychiatric/Behavioral: Positive for depression. The patient is nervous/anxious and has insomnia.    Allergies Allergies  Allergen Reactions  . E-Mycin [Erythromycin]     GI upset    SURGICAL HISTORY She  has a past surgical history that includes Gastric bypass; Tonsillectomy; Appendectomy; Wisdom tooth extraction; salivary stones removed; and Exploratory laparotomy (2006). FAMILY HISTORY Her family history includes Alzheimer's disease in her father; Emphysema in her mother; Heart disease in her father; Multiple sclerosis in her sister; Osteoporosis in her mother. SOCIAL HISTORY She  reports that she has never smoked. She has never used smokeless tobacco. She reports previous alcohol use. She reports that she does not use drugs.   Physical Exam: There were no vitals taken for this visit. Wt Readings from Last 3 Encounters:  08/29/19 256 lb (116.1 kg)  03/27/19 269 lb (122 kg)  11/22/18 250 lb 12.8 oz (113.8 kg)    General Appearance: Well nourished well  developed, in no apparent distress. Eyes: PERRLA, EOMs, conjunctiva no swelling or erythema ENT/Mouth: Ear canals normal without obstruction, swelling, erythma, discharge.  TMs normal bilaterally.  Oropharynx moist, clear, without exudate, or postoropharyngeal swelling. Neck: Supple, thyroid normal,no cervical adenopathy  Respiratory: Respiratory effort normal, Breath sounds clear A&P without rhonchi, wheeze, or rale.  No retractions, no accessory usage. Cardio: RRR with no MRGs. Brisk peripheral pulses without edema.  Abdomen: Soft, + BS,  Non tender, no guarding, rebound, hernias, masses. Musculoskeletal: Full ROM, 5/5 strength, Normal gait,  Skin: Warm, dry without rashes, lesions, ecchymosis.  GYN VULVA: vulvar erythema, vulvar hypopigmentation, VAGINA: normal appearing vagina with normal color and discharge, no lesions, exam limited by body habitus, unable to visualize cervix well,  PAP: Pap smear done today, had bright red blood on the speculum and brush. Neuro: Awake and oriented X 3, Cranial nerves intact. Normal muscle tone, no cerebellar symptoms. Psych: Normal affect, Insight and Judgment appropriate.   EKG: sinus brady, PRWP, no ST changes Aorta Scan:  Vicie Mutters, PA-C 2:42 PM [facility]

## 2020-05-01 ENCOUNTER — Ambulatory Visit: Payer: BC Managed Care – PPO | Admitting: Physician Assistant

## 2020-05-01 ENCOUNTER — Other Ambulatory Visit: Payer: Self-pay

## 2020-05-01 MED ORDER — LOSARTAN POTASSIUM-HCTZ 100-25 MG PO TABS: ORAL_TABLET | ORAL | 0 refills | Status: DC

## 2020-05-22 ENCOUNTER — Encounter: Payer: BC Managed Care – PPO | Admitting: Physician Assistant

## 2020-05-28 NOTE — Progress Notes (Signed)
COMPLETE PHYSICAL  Assessment and Plan:   Encounter for general adult medical examination with abnormal findings 1 year Will get colonoscopy in Jan  Essential hypertension, benign - continue medications, DASH diet, exercise and monitor at home. Call if greater than 130/80.  -     CBC with Differential/Platelet -     COMPLETE METABOLIC PANEL WITH GFR -     TSH -     EKG 12-Lead -     Urinalysis, Routine w reflex microscopic -     Microalbumin / creatinine urine ratio  Mixed hyperlipidemia check lipids decrease fatty foods increase activity.  -     Lipid panel  Morbid obesity ([place]) - follow up 3 months for progress monitoring - increase veggies, decrease carbs - long discussion about weight loss, diet, and exercise Will ;try wegovy- close follow up 6-8 weeks  Abnormal glucose -     Hemoglobin A1c Discussed disease progression and risks Discussed diet/exercise, weight management and risk modification  Fibromyalgia Continue medications  H/O gastric bypass Monitor levels  Adjustment disorder with mixed anxiety and depressed mood Continue medications, well controlled  Medication management -     Magnesium  Vitamin D deficiency -     VITAMIN D 25 Hydroxy (Vit-D Deficiency, Fractures)  Screen for colon cancer -     Ambulatory referral to Gastroenterology- wants done in Jan   Continue diet and meds as discussed. Further disposition pending results of labs.  HPI  64 y.o. female  presents for CPE and follow up with hypertension, hyperlipidemia, prediabetes and vitamin D.  She has FM and depression, has had negative autoimmune work up and has seen rheum, she is on cymbalta 60. She has been on elavil 100mg  at night.  BMI is Body mass index is 46.23 kg/m., she is working on diet and exercise. Wt Readings from Last 3 Encounters:  05/29/20 254 lb 12.8 oz (115.6 kg)  08/29/19 256 lb (116.1 kg)  03/27/19 269 lb (122 kg)     Her blood pressure has been controlled at  home, today their BP is BP: 128/74   She does not workout. She denies chest pain, shortness of breath, dizziness.    She is on cholesterol medication and denies myalgias. Her cholesterol is at goal. The cholesterol last visit was:   Lab Results  Component Value Date   CHOL 155 08/29/2019   HDL 53 08/29/2019   LDLCALC 82 08/29/2019   TRIG 102 08/29/2019   CHOLHDL 2.9 08/29/2019    She has not been working on diet and exercise for prediabetes, and denies foot ulcerations, hyperglycemia, hypoglycemia , increased appetite, nausea, paresthesia of the feet, polydipsia, polyuria, visual disturbances, vomiting and weight loss. Last A1C in the office was:  Lab Results  Component Value Date   HGBA1C 5.9 (H) 08/29/2019   Patient is on Vitamin D supplement.  Lab Results  Component Value Date   VD25OH 35 08/29/2019       Current Medications:    Current Outpatient Medications (Cardiovascular):  .  losartan-hydrochlorothiazide (HYZAAR) 100-25 MG tablet, Take 1 tablet Daily for BP .  atenolol (TENORMIN) 50 MG tablet, Take 1 tablet Daily for BP     Current Outpatient Medications (Other):  .  amitriptyline (ELAVIL) 50 MG tablet, TAKE 1 TO 2 TABLETS BY MOUTH AT BEDTIME AS NEEDED FOR SLEEP .  Calcium Carbonate-Vit D-Min (CALCIUM 1200 PO), Take by mouth daily. .  DULoxetine (CYMBALTA) 60 MG capsule, TAKE 1 CAPSULE BY MOUTH EVERY DAY .  Multiple Vitamins-Minerals (MULTIVITAMIN WITH MINERALS) tablet, Take 1 tablet by mouth daily.  Medical History:  Past Medical History:  Diagnosis Date  . Adjustment disorder with mixed anxiety and depressed mood 11/25/2013  . Fibromyalgia   . H/O gastric bypass   . Mixed hyperlipidemia 11/25/2013   Immunization History  Administered Date(s) Administered  . Influenza Inj Mdck Quad With Preservative 08/29/2019  . PFIZER SARS-COV-2 Vaccination 01/12/2020, 02/05/2020  . Pneumococcal-Unspecified 01/01/2004  . Td 10/18/2000  . Tdap 07/05/2013  . Zoster 06/03/2017    Health Maintenance  Topic Date Due  . COLONOSCOPY  Never done  . INFLUENZA VACCINE  05/18/2020  . PAP SMEAR-Modifier  11/22/2021  . MAMMOGRAM  12/30/2021  . TETANUS/TDAP  07/06/2023  . COVID-19 Vaccine  Completed  . Hepatitis C Screening  Completed  . HIV Screening  Completed    MGM 12/2019 Colonoscopy- NEVER- after counseling will get colonoscopy states will get in Jan.  PAP- 11/2018 Influenza get at pharmacy   Sleep study 08/2017- NEGATIVE SLEEP STUDY Echo 2006 Eye exam 09/2018 CXR 2008  Review of Systems:  Review of Systems  Constitutional: Negative for chills, fever and malaise/fatigue.  HENT: Negative for congestion, ear pain and sore throat.   Eyes: Negative.   Respiratory: Negative for cough, shortness of breath and wheezing.   Cardiovascular: Negative for chest pain, palpitations and leg swelling.  Gastrointestinal: Negative for abdominal pain, blood in stool, constipation, diarrhea, heartburn and melena.  Genitourinary: Negative.   Musculoskeletal: Negative for joint pain.  Skin: Negative for rash.  Neurological: Negative for dizziness, sensory change, loss of consciousness and headaches.  Psychiatric/Behavioral: Positive for depression. The patient is nervous/anxious and has insomnia.    Allergies Allergies  Allergen Reactions  . E-Mycin [Erythromycin]     GI upset    SURGICAL HISTORY She  has a past surgical history that includes Gastric bypass; Tonsillectomy; Appendectomy; Wisdom tooth extraction; salivary stones removed; and Exploratory laparotomy (2006). FAMILY HISTORY Her family history includes Alzheimer's disease in her father; Emphysema in her mother; Heart disease in her father; Multiple sclerosis in her sister; Osteoporosis in her mother. SOCIAL HISTORY She  reports that she has never smoked. She has never used smokeless tobacco. She reports previous alcohol use. She reports that she does not use drugs.   Physical Exam: BP 128/74   Pulse 74    Temp (!) 97.3 F (36.3 C)   Ht 5' 2.25" (1.581 m)   Wt 254 lb 12.8 oz (115.6 kg)   SpO2 92%   BMI 46.23 kg/m  Wt Readings from Last 3 Encounters:  05/29/20 254 lb 12.8 oz (115.6 kg)  08/29/19 256 lb (116.1 kg)  03/27/19 269 lb (122 kg)    General Appearance: Well nourished well developed, in no apparent distress. Eyes: PERRLA, EOMs, conjunctiva no swelling or erythema ENT/Mouth: Ear canals normal without obstruction, swelling, erythma, discharge.  TMs normal bilaterally.  Oropharynx moist, clear, without exudate, or postoropharyngeal swelling. Neck: Supple, thyroid normal,no cervical adenopathy  Respiratory: Respiratory effort normal, Breath sounds clear A&P without rhonchi, wheeze, or rale.  No retractions, no accessory usage. Cardio: RRR with no MRGs. Brisk peripheral pulses without edema.  Abdomen: Soft, + BS,  Non tender, no guarding, rebound, hernias, masses. Musculoskeletal: Full ROM, 5/5 strength, Normal gait,  Skin: Warm, dry without rashes, lesions, ecchymosis.  Neuro: Awake and oriented X 3, Cranial nerves intact. Normal muscle tone, no cerebellar symptoms. Psych: Normal affect, Insight and Judgment appropriate.   EKG: sinus brady, PRWP, no  ST changes Aorta Scan: defer  Vicie Mutters, PA-C 9:32 AM [facility]

## 2020-05-29 ENCOUNTER — Ambulatory Visit (INDEPENDENT_AMBULATORY_CARE_PROVIDER_SITE_OTHER): Payer: BC Managed Care – PPO | Admitting: Physician Assistant

## 2020-05-29 ENCOUNTER — Encounter: Payer: Self-pay | Admitting: Physician Assistant

## 2020-05-29 ENCOUNTER — Other Ambulatory Visit: Payer: Self-pay

## 2020-05-29 VITALS — BP 128/74 | HR 74 | Temp 97.3°F | Ht 62.25 in | Wt 254.8 lb

## 2020-05-29 DIAGNOSIS — Z79899 Other long term (current) drug therapy: Secondary | ICD-10-CM

## 2020-05-29 DIAGNOSIS — Z0001 Encounter for general adult medical examination with abnormal findings: Secondary | ICD-10-CM

## 2020-05-29 DIAGNOSIS — Z131 Encounter for screening for diabetes mellitus: Secondary | ICD-10-CM

## 2020-05-29 DIAGNOSIS — R7309 Other abnormal glucose: Secondary | ICD-10-CM

## 2020-05-29 DIAGNOSIS — E559 Vitamin D deficiency, unspecified: Secondary | ICD-10-CM

## 2020-05-29 DIAGNOSIS — Z1389 Encounter for screening for other disorder: Secondary | ICD-10-CM

## 2020-05-29 DIAGNOSIS — Z136 Encounter for screening for cardiovascular disorders: Secondary | ICD-10-CM | POA: Diagnosis not present

## 2020-05-29 DIAGNOSIS — I10 Essential (primary) hypertension: Secondary | ICD-10-CM

## 2020-05-29 DIAGNOSIS — Z1329 Encounter for screening for other suspected endocrine disorder: Secondary | ICD-10-CM

## 2020-05-29 DIAGNOSIS — Z0000 Encounter for general adult medical examination without abnormal findings: Secondary | ICD-10-CM

## 2020-05-29 DIAGNOSIS — Z1211 Encounter for screening for malignant neoplasm of colon: Secondary | ICD-10-CM

## 2020-05-29 DIAGNOSIS — M797 Fibromyalgia: Secondary | ICD-10-CM

## 2020-05-29 DIAGNOSIS — Z9884 Bariatric surgery status: Secondary | ICD-10-CM

## 2020-05-29 DIAGNOSIS — E782 Mixed hyperlipidemia: Secondary | ICD-10-CM

## 2020-05-29 DIAGNOSIS — Z130 Encounter for screening for diseases of the blood and blood-forming organs and certain disorders involving the immune mechanism: Secondary | ICD-10-CM

## 2020-05-29 DIAGNOSIS — F4323 Adjustment disorder with mixed anxiety and depressed mood: Secondary | ICD-10-CM

## 2020-05-29 DIAGNOSIS — Z13220 Encounter for screening for lipoid disorders: Secondary | ICD-10-CM | POA: Diagnosis not present

## 2020-05-29 MED ORDER — WEGOVY 0.25 MG/0.5ML ~~LOC~~ SOAJ: 0.2500 mg | SUBCUTANEOUS | 0 refills | Status: DC

## 2020-05-29 MED ORDER — WEGOVY 0.5 MG/0.5ML ~~LOC~~ SOAJ: 0.5000 mg | SUBCUTANEOUS | 0 refills | Status: DC

## 2020-05-29 NOTE — Patient Instructions (Addendum)
Going to refer for colonoscopy  Colon cancer is 3rd most diagnosed cancer and 2nd leading cause of death in both men and women 64 years of age and older despite being one of the most preventable and treatable cancers if found early.  4 of out 5 people diagnosed with colon cancer have NO prior family history.  When caught EARLY 90% of colon cancer is curable.    You need to go to [email or web address] to get the savings card.  Check the box halfway down the page and then click on get card now.   Then you can print, download, or email the savings card.  You will have to go online to register and activate the card before going to pharmacy.  [facility], [facility], and [facility] seem to have a good system down so I suggest taking it to one of those pharamcy's.   We will start you on the 0.25mg  once weekly for 4 weeks Then you can do 0.5mg  once weekly for 4 weeks Then you can do the 1 mg once weekly for 4 weeks Then you can do 1.7mg  once weekly for 4 weeks Then you can do 2.4mg  once weekly and stay on this dose.   If you get nausea stay on the tolerated dose x 1-2 more weeks and then try to move up  The main issues with this is the nausea Eat small frequent meals and avoid fatty foods.   General eating tips  What to Avoid  Avoid added sugars o Often added sugar can be found in processed foods such as many condiments, dry cereals, cakes, cookies, chips, crisps, crackers, candies, sweetened drinks, etc.  o Read labels and AVOID/DECREASE use of foods with the following in their ingredient list: Sugar, fructose, high fructose corn syrup, sucrose, glucose, maltose, dextrose, molasses, cane sugar, brown sugar, any type of syrup, agave nectar, etc.    Avoid snacking in between meals- drink water or if you feel you need a snack, pick a high water content snack such as cucumbers, watermelon, or any veggie.   Avoid foods made with flour o If you are going to eat food made with flour, choose those made with  whole-grains; and, minimize your consumption as much as is tolerable  Avoid processed foods o These foods are generally stocked in the middle of the grocery store.  o Focus on shopping on the perimeter of the grocery.  What to Include  Vegetables o GREEN LEAFY VEGETABLES: Kale, spinach, mustard greens, collard greens, cabbage, broccoli, etc. o OTHER: Asparagus, cauliflower, eggplant, carrots, peas, Brussel sprouts, tomatoes, bell peppers, zucchini, beets, cucumbers, etc.  Grains, seeds, and legumes o Beans: kidney beans, black eyed peas, garbanzo beans, black beans, pinto beans, etc. o Whole, unrefined grains: brown rice, barley, bulgur, oatmeal, etc.  Healthy fats  o Avoid highly processed fats such as vegetable oil o Examples of healthy fats: avocado, olives, virgin olive oil, dark chocolate (?72% Cocoa), nuts (peanuts, almonds, walnuts, cashews, pecans, etc.) o Please still do small amount of these healthy fats, they are dense in calories.   Low - Moderate Intake of Animal Sources of Protein o Meat sources: chicken, [place], salmon, tuna. Limit to 4 ounces of meat at one time or the size of your palm. o Consider limiting dairy sources, but when choosing dairy focus on: PLAIN [place] yogurt, cottage cheese, high-protein milk  Fruit o Choose berries

## 2020-05-30 ENCOUNTER — Encounter: Payer: Self-pay | Admitting: Internal Medicine

## 2020-05-30 LAB — URINALYSIS, ROUTINE W REFLEX MICROSCOPIC
Bacteria, UA: NONE SEEN /HPF
Bilirubin Urine: NEGATIVE
Glucose, UA: NEGATIVE
Hgb urine dipstick: NEGATIVE
Hyaline Cast: NONE SEEN /LPF
Ketones, ur: NEGATIVE
Nitrite: NEGATIVE
Protein, ur: NEGATIVE
Specific Gravity, Urine: 1.021 (ref 1.001–1.03)
pH: 5.5 (ref 5.0–8.0)

## 2020-05-30 LAB — HEMOGLOBIN A1C
Hgb A1c MFr Bld: 5.9 % of total Hgb — ABNORMAL HIGH (ref <5.7)
Mean Plasma Glucose: 123 (calc)
eAG (mmol/L): 6.8 (calc)

## 2020-05-30 LAB — CBC WITH DIFFERENTIAL/PLATELET
Absolute Monocytes: 328 cells/uL (ref 200–950)
Basophils Absolute: 40 cells/uL (ref 0–200)
Basophils Relative: 0.6 %
Eosinophils Absolute: 47 cells/uL (ref 15–500)
Eosinophils Relative: 0.7 %
HCT: 41.4 % (ref 35.0–45.0)
Hemoglobin: 13.1 g/dL (ref 11.7–15.5)
Lymphs Abs: 1930 cells/uL (ref 850–3900)
MCH: 26.6 pg — ABNORMAL LOW (ref 27.0–33.0)
MCHC: 31.6 g/dL — ABNORMAL LOW (ref 32.0–36.0)
MCV: 84 fL (ref 80.0–100.0)
MPV: 11.8 fL (ref 7.5–12.5)
Monocytes Relative: 4.9 %
Neutro Abs: 4355 cells/uL (ref 1500–7800)
Neutrophils Relative %: 65 %
Platelets: 306 10*3/uL (ref 140–400)
RBC: 4.93 10*6/uL (ref 3.80–5.10)
RDW: 14.1 % (ref 11.0–15.0)
Total Lymphocyte: 28.8 %
WBC: 6.7 10*3/uL (ref 3.8–10.8)

## 2020-05-30 LAB — COMPLETE METABOLIC PANEL WITH GFR
AG Ratio: 1.4 (calc) (ref 1.0–2.5)
ALT: 15 U/L (ref 6–29)
AST: 18 U/L (ref 10–35)
Albumin: 3.9 g/dL (ref 3.6–5.1)
Alkaline phosphatase (APISO): 65 U/L (ref 37–153)
BUN: 20 mg/dL (ref 7–25)
CO2: 28 mmol/L (ref 20–32)
Calcium: 9.2 mg/dL (ref 8.6–10.4)
Chloride: 102 mmol/L (ref 98–110)
Creat: 0.74 mg/dL (ref 0.50–0.99)
GFR, Est African American: 99 mL/min/{1.73_m2} (ref >=60)
GFR, Est Non African American: 86 mL/min/{1.73_m2} (ref >=60)
Globulin: 2.8 g/dL (calc) (ref 1.9–3.7)
Glucose, Bld: 87 mg/dL (ref 65–99)
Potassium: 4.1 mmol/L (ref 3.5–5.3)
Sodium: 139 mmol/L (ref 135–146)
Total Bilirubin: 0.4 mg/dL (ref 0.2–1.2)
Total Protein: 6.7 g/dL (ref 6.1–8.1)

## 2020-05-30 LAB — MICROALBUMIN / CREATININE URINE RATIO
Creatinine, Urine: 108 mg/dL (ref 20–275)
Microalb Creat Ratio: 5 mcg/mg creat (ref <30)
Microalb, Ur: 0.5 mg/dL

## 2020-05-30 LAB — IRON, TOTAL/TOTAL IRON BINDING CAP
%SAT: 20 % (calc) (ref 16–45)
Iron: 77 ug/dL (ref 45–160)
TIBC: 389 mcg/dL (calc) (ref 250–450)

## 2020-05-30 LAB — LIPID PANEL
Cholesterol: 183 mg/dL (ref <200)
HDL: 61 mg/dL (ref >=50)
LDL Cholesterol (Calc): 101 mg/dL (calc) — ABNORMAL HIGH
Non-HDL Cholesterol (Calc): 122 mg/dL (calc) (ref <130)
Total CHOL/HDL Ratio: 3 (calc) (ref <5.0)
Triglycerides: 109 mg/dL (ref <150)

## 2020-05-30 LAB — FERRITIN: Ferritin: 11 ng/mL — ABNORMAL LOW (ref 16–288)

## 2020-05-30 LAB — VITAMIN D 25 HYDROXY (VIT D DEFICIENCY, FRACTURES): Vit D, 25-Hydroxy: 38 ng/mL (ref 30–100)

## 2020-05-30 LAB — VITAMIN B12: Vitamin B-12: 390 pg/mL (ref 200–1100)

## 2020-05-30 LAB — TSH: TSH: 1.15 mIU/L (ref 0.40–4.50)

## 2020-05-30 LAB — MAGNESIUM: Magnesium: 2.1 mg/dL (ref 1.5–2.5)

## 2020-05-30 NOTE — Addendum Note (Signed)
Addended by: Vicie Mutters R on: 05/30/2020 08:26 AM   Modules accepted: Orders

## 2020-06-03 ENCOUNTER — Other Ambulatory Visit: Payer: Self-pay | Admitting: Physician Assistant

## 2020-06-03 MED ORDER — WEGOVY 0.5 MG/0.5ML ~~LOC~~ SOAJ: 0.5000 mg | SUBCUTANEOUS | 0 refills | Status: DC

## 2020-06-26 MED ORDER — WEGOVY 1 MG/0.5ML ~~LOC~~ SOAJ: 1.0000 mg | SUBCUTANEOUS | 0 refills | Status: DC

## 2020-06-26 MED ORDER — WEGOVY 0.5 MG/0.5ML ~~LOC~~ SOAJ: 0.5000 mg | SUBCUTANEOUS | 0 refills | Status: DC

## 2020-06-26 MED ORDER — WEGOVY 1.7 MG/0.75ML ~~LOC~~ SOAJ: 1.7000 mg | SUBCUTANEOUS | 0 refills | Status: DC

## 2020-07-02 MED ORDER — OZEMPIC (0.25 OR 0.5 MG/DOSE) 2 MG/1.5ML ~~LOC~~ SOPN: 0.5000 mg | PEN_INJECTOR | SUBCUTANEOUS | 0 refills | Status: DC

## 2020-07-02 NOTE — Telephone Encounter (Signed)
Will ascertain that information & will contact patient.

## 2020-07-30 ENCOUNTER — Ambulatory Visit: Payer: BC Managed Care – PPO | Admitting: Adult Health Nurse Practitioner

## 2020-07-31 ENCOUNTER — Other Ambulatory Visit: Payer: Self-pay

## 2020-07-31 MED ORDER — OZEMPIC (0.25 OR 0.5 MG/DOSE) 2 MG/1.5ML ~~LOC~~ SOPN: 0.5000 mg | PEN_INJECTOR | SUBCUTANEOUS | 1 refills | Status: DC

## 2020-08-08 ENCOUNTER — Other Ambulatory Visit: Payer: Self-pay | Admitting: Adult Health Nurse Practitioner

## 2020-08-08 DIAGNOSIS — M797 Fibromyalgia: Secondary | ICD-10-CM

## 2020-08-08 MED ORDER — DULOXETINE HCL 30 MG PO CPEP: ORAL_CAPSULE | ORAL | 0 refills | Status: DC

## 2020-08-12 ENCOUNTER — Other Ambulatory Visit: Payer: Self-pay

## 2020-08-12 ENCOUNTER — Ambulatory Visit (INDEPENDENT_AMBULATORY_CARE_PROVIDER_SITE_OTHER): Payer: BC Managed Care – PPO | Admitting: Adult Health Nurse Practitioner

## 2020-08-12 ENCOUNTER — Encounter: Payer: Self-pay | Admitting: Adult Health Nurse Practitioner

## 2020-08-12 DIAGNOSIS — I10 Essential (primary) hypertension: Secondary | ICD-10-CM | POA: Diagnosis not present

## 2020-08-12 DIAGNOSIS — Z6841 Body Mass Index (BMI) 40.0 and over, adult: Secondary | ICD-10-CM | POA: Diagnosis not present

## 2020-08-12 MED ORDER — OZEMPIC (1 MG/DOSE) 2 MG/1.5ML ~~LOC~~ SOPN: 1.0000 mg | PEN_INJECTOR | SUBCUTANEOUS | 4 refills | Status: DC

## 2020-08-12 MED ORDER — LOSARTAN POTASSIUM-HCTZ 100-25 MG PO TABS: ORAL_TABLET | ORAL | 3 refills | Status: DC

## 2020-08-12 NOTE — Progress Notes (Signed)
FOLLOW UP - WEIGHT LOSS   Diagnoses and all orders for this visit:  Morbid obesity ([place]) ollow up 6-8weeks months for progress monitoring - increase veggies, decrease carbs -     Semaglutide, 1 MG/DOSE, (OZEMPIC, 1 MG/DOSE,) 2 MG/1.5ML SOPN; Inject 1 mg into the skin once a week.  BMI 40.0-44.9, adult (HCC) Discussed dietary and exercise modifications -     Semaglutide, 1 MG/DOSE, (OZEMPIC, 1 MG/DOSE,) 2 MG/1.5ML SOPN; Inject 1 mg into the skin once a week.  Essential hypertension, benign Continue current medications: Monitor blood pressure at home; call if consistently over 130/80 Continue DASH diet.   Reminder to go to the ER if any CP, SOB, nausea, dizziness, severe HA, changes vision/speech, left arm numbness and tingling and jaw pain. -     losartan-hydrochlorothiazide (HYZAAR) 100-25 MG tablet; Take 1 tablet Daily for BP   64 y.o.female presents for a follow up after being on Wegovy, then switches to Ozemic 0.5mg . for weight loss.   While on the medication they have lost 11 lbs since last visit which was 8 weeks ago.  They deny palpitations, anxiety, trouble sleeping, elevated BP.   BP Readings from Last 3 Encounters:  08/12/20 132/76  05/29/20 128/74  08/29/19 134/82    BMI is Body mass index is 44.09 kg/m., she is working on diet and exercise. Wt Readings from Last 3 Encounters:  08/12/20 243 lb (110.2 kg)  05/29/20 254 lb 12.8 oz (115.6 kg)  08/29/19 256 lb (116.1 kg)    Typical breakfast: Does not eat breakfast Typical lunch: Fast food.  Green beans potatoes Typical dinner: Fake crab meat, greek salad with meat.  Water intake:  History of dry mouth.  Drinks coffee, sweet tea, diet mountain dew  She is having difficulty with sleeping at night.  She is taking losartan-HCTZ at bedtime.  Reports she get [place] to pee frequently.  No injection site reactions noted.  Discussed dietary modifications.   Assessment: Obesity with co morbid conditions.   Plan: She  will work on meal planning, intentional eating, and increasing water.  She has been instructed to work up to a goal of 150 minutes of combined cardio and strengthening exercise per week for weight loss and overall health benefits. We discussed the following Behavioral Modification Strategies today: increasing lean protein intake, decreasing simple carbohydrates, increasing vegetables, increase H20 intake, decrease eating out, no skipping meals, work on meal planning and easy cooking plans, keeping healthy foods in the home, and planning for success.   She has agreed to follow-up with our clinic in 6 weeks. She was informed of the importance of frequent follow-up visits to maximizeHIS@ success with intensive lifestyle modifications for her multiple health conditions.  Medication refill: Ozempic, increase dose to 1mg , inject into skin weekly.  Future Appointments  Date Time Provider [place]  10/02/2020  3:30 PM Garnet Sierras, NP [facility] None  06/01/2021  9:00 AM Liane Comber, NP [facility] None    ADD on [phone] 15 mins    She has FM and depression, has had negative autoimmune work up and has seen rheum, she is on cymbalta 30mg . She has been on elavil 100mg  at night but stopped related to weight gain side effects.  BMI is Body mass index is 44.09 kg/m., she is working on diet and exercise. Wt Readings from Last 3 Encounters:  08/12/20 243 lb (110.2 kg)  05/29/20 254 lb 12.8 oz (115.6 kg)  08/29/19 256 lb (116.1 kg)    She has  not been working on diet and exercise for prediabetes, and denies foot ulcerations, hyperglycemia, hypoglycemia , increased appetite, nausea, paresthesia of the feet, polydipsia, polyuria, visual disturbances, vomiting and weight loss. Last A1C in the office was:  Lab Results  Component Value Date   HGBA1C 5.9 (H) 05/29/2020   Patient is on Vitamin D supplement.  Lab Results  Component Value Date   VD25OH 38 05/29/2020       Current  Medications:   Current Outpatient Medications (Endocrine & Metabolic):  .  Semaglutide, 1 MG/DOSE, (OZEMPIC, 1 MG/DOSE,) 2 MG/1.5ML SOPN, Inject 1 mg into the skin once a week.  Current Outpatient Medications (Cardiovascular):  .  losartan-hydrochlorothiazide (HYZAAR) 100-25 MG tablet, Take 1 tablet Daily for BP     Current Outpatient Medications (Other):  Marland Kitchen  Calcium Carbonate-Vit D-Min (CALCIUM 1200 PO), Take by mouth daily. .  DULoxetine (CYMBALTA) 30 MG capsule, TAKE 1 CAPSULE BY MOUTH EVERY DAY .  Multiple Vitamins-Minerals (MULTIVITAMIN WITH MINERALS) tablet, Take 1 tablet by mouth daily.  Medical History:  Past Medical History:  Diagnosis Date  . Adjustment disorder with mixed anxiety and depressed mood 11/25/2013  . Fibromyalgia   . H/O gastric bypass   . Mixed hyperlipidemia 11/25/2013   Immunization History  Administered Date(s) Administered  . Influenza Inj Mdck Quad With Preservative 08/29/2019  . PFIZER SARS-COV-2 Vaccination 01/12/2020, 02/05/2020  . Pneumococcal-Unspecified 01/01/2004  . Td 10/18/2000  . Tdap 07/05/2013  . Zoster 06/03/2017   Health Maintenance  Topic Date Due  . COLONOSCOPY  Never done  . INFLUENZA VACCINE  05/18/2020  . PAP SMEAR-Modifier  11/22/2021  . MAMMOGRAM  12/30/2021  . TETANUS/TDAP  07/06/2023  . COVID-19 Vaccine  Completed  . Hepatitis C Screening  Completed  . HIV Screening  Completed    MGM 12/2019 Colonoscopy- NEVER- after counseling will get colonoscopy states will get in Jan.  PAP- 11/2018 Influenza get at pharmacy   Sleep study 08/2017- NEGATIVE SLEEP STUDY Echo 2006 Eye exam 09/2018 CXR 2008  Review of Systems:  Review of Systems  Constitutional: Negative for chills, fever and malaise/fatigue.  HENT: Negative for congestion, ear pain and sore throat.   Eyes: Negative.   Respiratory: Negative for cough, shortness of breath and wheezing.   Cardiovascular: Negative for chest pain, palpitations and leg swelling.   Gastrointestinal: Negative for abdominal pain, blood in stool, constipation, diarrhea, heartburn and melena.  Genitourinary: Negative.   Musculoskeletal: Negative for joint pain.  Skin: Negative for rash.  Neurological: Negative for dizziness, sensory change, loss of consciousness and headaches.  Psychiatric/Behavioral: Positive for depression. The patient is nervous/anxious and has insomnia.    Allergies Allergies  Allergen Reactions  . E-Mycin [Erythromycin]     GI upset    SURGICAL HISTORY She  has a past surgical history that includes Gastric bypass; Tonsillectomy; Appendectomy; Wisdom tooth extraction; salivary stones removed; and Exploratory laparotomy (2006). FAMILY HISTORY Her family history includes Alzheimer's disease in her father; Emphysema in her mother; Heart disease in her father; Multiple sclerosis in her sister; Osteoporosis in her mother. SOCIAL HISTORY She  reports that she has never smoked. She has never used smokeless tobacco. She reports previous alcohol use. She reports that she does not use drugs.   Physical Exam: BP 132/76   Pulse 67   Temp 97.7 F (36.5 C)   Wt 243 lb (110.2 kg)   SpO2 97%   BMI 44.09 kg/m  Wt Readings from Last 3 Encounters:  08/12/20 243 lb (  110.2 kg)  05/29/20 254 lb 12.8 oz (115.6 kg)  08/29/19 256 lb (116.1 kg)    General Appearance: Well nourished well developed, in no apparent distress. Eyes: PERRLA, EOMs, conjunctiva no swelling or erythema ENT/Mouth: Ear canals normal without obstruction, swelling, erythma, discharge.  TMs normal bilaterally.  Oropharynx moist, clear, without exudate, or postoropharyngeal swelling. Neck: Supple, thyroid normal,no cervical adenopathy  Respiratory: Respiratory effort normal, Breath sounds clear A&P without rhonchi, wheeze, or rale.  No retractions, no accessory usage. Cardio: RRR with no MRGs. Brisk peripheral pulses without edema.  Abdomen: Soft, + BS,  Non tender, no guarding, rebound,  hernias, masses. Musculoskeletal: Full ROM, 5/5 strength, Normal gait,  Skin: Warm, dry without rashes, lesions, ecchymosis.  Neuro: Awake and oriented X 3, Cranial nerves intact. Normal muscle tone, no cerebellar symptoms. Psych: Normal affect, Insight and Judgment appropriate.    Garnet Sierras, Laqueta Jean, DNP [facility] 08/12/2020  9:36 AM

## 2020-10-02 ENCOUNTER — Ambulatory Visit (INDEPENDENT_AMBULATORY_CARE_PROVIDER_SITE_OTHER): Payer: Medicare Other | Admitting: Adult Health Nurse Practitioner

## 2020-10-02 ENCOUNTER — Other Ambulatory Visit: Payer: Self-pay

## 2020-10-02 ENCOUNTER — Encounter: Payer: Self-pay | Admitting: Adult Health Nurse Practitioner

## 2020-10-02 DIAGNOSIS — I10 Essential (primary) hypertension: Secondary | ICD-10-CM

## 2020-10-02 DIAGNOSIS — Z6841 Body Mass Index (BMI) 40.0 and over, adult: Secondary | ICD-10-CM

## 2020-10-02 NOTE — Progress Notes (Signed)
FOLLOW UP - WEIGHT LOSS   Diagnoses and all orders for this visit:  Morbid obesity ([place]) ollow up 6-8weeks months for progress monitoring - increase veggies, decrease carbs -     Semaglutide, 1 MG/DOSE, (OZEMPIC, 1 MG/DOSE,) 2 MG/1.5ML SOPN; Inject 1 mg into the skin once a week.  BMI 40.0-44.9, adult (HCC) Discussed dietary and exercise modifications -     Semaglutide, 1 MG/DOSE, (OZEMPIC, 1 MG/DOSE,) 2 MG/1.5ML SOPN; Inject 1 mg into the skin once a week.  Essential hypertension, benign Continue current medications: Monitor blood pressure at home; call if consistently over 130/80 Continue DASH diet.   Reminder to go to the ER if any CP, SOB, nausea, dizziness, severe HA, changes vision/speech, left arm numbness and tingling and jaw pain. -     losartan-hydrochlorothiazide (HYZAAR) 100-25 MG tablet; Take 1 tablet Daily for BP _______________________________________________________________   HPI:   64 y.o.female presents for a follow up after being on Wegovy, then switches to Ozemic 0.5mg . for weight loss.   While on the medication they have lost 11 lbs since last visit which was 8 weeks ago.  They deny palpitations, anxiety, trouble sleeping, elevated BP.   BP Readings from Last 3 Encounters:  10/02/20 138/90  08/12/20 132/76  05/29/20 128/74    BMI is Body mass index is 44.08 kg/m., she is working on diet and exercise. Wt Readings from Last 3 Encounters:  10/02/20 241 lb (109.3 kg)  08/12/20 243 lb (110.2 kg)  05/29/20 254 lb 12.8 oz (115.6 kg)    Typical breakfast: Does not eat breakfast Typical lunch: Fast food.  Green beans potatoes Typical dinner: Fake crab meat, greek salad with meat.  Water intake:  History of dry mouth.  Drinks coffee, sweet tea, diet mountain dew has decreased this.  She is having difficulty with sleeping at night.  She is taking losartan-HCTZ at bedtime.  Reports she get [place] to pee frequently. Discussed timing of this and has changed from  last OV.  No injection site reactions noted.  Discussed dietary modifications.   Assessment: Obesity with co morbid conditions.   Plan: She will work on meal planning, intentional eating, and increasing water.  She has been instructed to work up to a goal of 150 minutes of combined cardio and strengthening exercise per week for weight loss and overall health benefits. We discussed the following Behavioral Modification Strategies today: increasing lean protein intake, decreasing simple carbohydrates, increasing vegetables, increase H20 intake, decrease eating out, no skipping meals, work on meal planning and easy cooking plans, keeping healthy foods in the home, and planning for success.   She has agreed to follow-up with our clinic in 6 weeks. She was informed of the importance of frequent follow-up visits to maximizeHIS@ success with intensive lifestyle modifications for her multiple health conditions.  Medication refill: Ozempic, increase dose to 1mg , inject into skin weekly.  Future Appointments  Date Time Provider [place]  11/06/2020  9:30 AM Garnet Sierras, NP [facility] None  06/01/2021  9:00 AM Jermon Chalfant, [name], NP [facility] None    [place] on [id number] 15 mins    She has FM and depression, has had negative autoimmune work up and has seen rheum, she is on cymbalta 30mg . She has been on elavil 100mg  at night but stopped related to weight gain side effects.  BMI is Body mass index is 44.08 kg/m., she is working on diet and exercise. Wt Readings from Last 3 Encounters:  10/02/20 241 lb (109.3 kg)  08/12/20  243 lb (110.2 kg)  05/29/20 254 lb 12.8 oz (115.6 kg)    She has not been working on diet and exercise for prediabetes, and denies foot ulcerations, hyperglycemia, hypoglycemia , increased appetite, nausea, paresthesia of the feet, polydipsia, polyuria, visual disturbances, vomiting and weight loss. Last A1C in the office was:  Lab Results  Component Value Date    HGBA1C 5.9 (H) 05/29/2020   Patient is on Vitamin D supplement.  Lab Results  Component Value Date   VD25OH 38 05/29/2020       Current Medications:   Current Outpatient Medications (Endocrine & Metabolic):    Semaglutide, 1 MG/DOSE, (OZEMPIC, 1 MG/DOSE,) 2 MG/1.5ML SOPN, Inject 1 mg into the skin once a week.  Current Outpatient Medications (Cardiovascular):    losartan-hydrochlorothiazide (HYZAAR) 100-25 MG tablet, Take 1 tablet Daily for BP     Current Outpatient Medications (Other):    Calcium Carbonate-Vit D-Min (CALCIUM 1200 PO), Take by mouth daily.   DULoxetine (CYMBALTA) 30 MG capsule, TAKE 1 CAPSULE BY MOUTH EVERY DAY   Multiple Vitamins-Minerals (MULTIVITAMIN WITH MINERALS) tablet, Take 1 tablet by mouth daily.  Medical History:  Past Medical History:  Diagnosis Date   Adjustment disorder with mixed anxiety and depressed mood 11/25/2013   Fibromyalgia    H/O gastric bypass    Mixed hyperlipidemia 11/25/2013   Immunization History  Administered Date(s) Administered   Influenza Inj Mdck Quad With Preservative 08/29/2019   PFIZER SARS-COV-2 Vaccination 01/12/2020, 02/05/2020, 10/03/2020   Pneumococcal-Unspecified 01/01/2004   Td 10/18/2000   Tdap 07/05/2013   Zoster 06/03/2017   Health Maintenance  Topic Date Due   COLONOSCOPY  Never done   INFLUENZA VACCINE  05/18/2020   PAP SMEAR-Modifier  11/22/2021   MAMMOGRAM  12/30/2021   TETANUS/TDAP  07/06/2023   COVID-19 Vaccine  Completed   Hepatitis C Screening  Completed   HIV Screening  Completed    MGM 12/2019 Colonoscopy- NEVER- after counseling will get colonoscopy states will get in Jan.  PAP- 11/2018 Influenza get at pharmacy   Sleep study 08/2017- NEGATIVE SLEEP STUDY Echo 2006 Eye exam 09/2018 CXR 2008  Review of Systems:  Review of Systems  Constitutional: Negative for chills, fever and malaise/fatigue.  HENT: Negative for congestion, ear pain and sore throat.   Eyes:  Negative.   Respiratory: Negative for cough, shortness of breath and wheezing.   Cardiovascular: Negative for chest pain, palpitations and leg swelling.  Gastrointestinal: Negative for abdominal pain, blood in stool, constipation, diarrhea, heartburn and melena.  Genitourinary: Negative.   Musculoskeletal: Negative for joint pain.  Skin: Negative for rash.  Neurological: Negative for dizziness, sensory change, loss of consciousness and headaches.  Psychiatric/Behavioral: Positive for depression. The patient is nervous/anxious and has insomnia.    Allergies Allergies  Allergen Reactions   E-Mycin [Erythromycin]     GI upset    SURGICAL HISTORY She  has a past surgical history that includes Gastric bypass; Tonsillectomy; Appendectomy; Wisdom tooth extraction; salivary stones removed; and Exploratory laparotomy (2006). FAMILY HISTORY Her family history includes Alzheimer's disease in her father; Emphysema in her mother; Heart disease in her father; Multiple sclerosis in her sister; Osteoporosis in her mother. SOCIAL HISTORY She  reports that she has never smoked. She has never used smokeless tobacco. She reports previous alcohol use. She reports that she does not use drugs.   Physical Exam: BP 138/90    Pulse 76    Temp (!) 97.1 F (36.2 C)    Ht 5\' 2"  (  1.575 m)    Wt 241 lb (109.3 kg)    SpO2 94%    BMI 44.08 kg/m  Wt Readings from Last 3 Encounters:  10/02/20 241 lb (109.3 kg)  08/12/20 243 lb (110.2 kg)  05/29/20 254 lb 12.8 oz (115.6 kg)    General Appearance: Well nourished well developed, in no apparent distress. Eyes: PERRLA, EOMs, conjunctiva no swelling or erythema ENT/Mouth: Ear canals normal without obstruction, swelling, erythma, discharge.  TMs normal bilaterally.  Oropharynx moist, clear, without exudate, or postoropharyngeal swelling. Neck: Supple, thyroid normal,no cervical adenopathy  Respiratory: Respiratory effort normal, Breath sounds clear A&P without  rhonchi, wheeze, or rale.  No retractions, no accessory usage. Cardio: RRR with no MRGs. Brisk peripheral pulses without edema.  Abdomen: Soft, + BS,  Non tender, no guarding, rebound, hernias, masses. Musculoskeletal: Full ROM, 5/5 strength, Normal gait,  Skin: Warm, dry without rashes, lesions, ecchymosis.  Neuro: Awake and oriented X 3, Cranial nerves intact. Normal muscle tone, no cerebellar symptoms. Psych: Normal affect, Insight and Judgment appropriate.    Garnet Sierras, Laqueta Jean, DNP [facility] 10/02/2020  4:02 PM

## 2020-10-03 ENCOUNTER — Ambulatory Visit: Payer: BC Managed Care – PPO | Attending: Internal Medicine

## 2020-10-03 DIAGNOSIS — Z23 Encounter for immunization: Secondary | ICD-10-CM

## 2020-10-03 NOTE — Progress Notes (Signed)
   Covid-19 Vaccination Clinic  Name:  Nicole Padilla    MRN: [id number] DOB: [birth year]  10/03/2020  Nicole Padilla was observed post Covid-19 immunization for 15 minutes without incident. She was provided with Vaccine Information Sheet and instruction to access the V-Safe system.   Nicole Padilla was instructed to call 911 with any severe reactions post vaccine: Marland Kitchen Difficulty breathing  . Swelling of face and throat  . A fast heartbeat  . A bad rash all over body  . Dizziness and weakness   Immunizations Administered    Name Date Dose VIS Date Route   Pfizer COVID-19 Vaccine 10/03/2020  1:39 PM 0.3 mL 08/06/2020 Intramuscular   Manufacturer: [place]   Lot: [id number]   [place]: [id number]

## 2020-10-12 ENCOUNTER — Encounter: Payer: Self-pay | Admitting: Adult Health Nurse Practitioner

## 2020-10-16 ENCOUNTER — Other Ambulatory Visit: Payer: Self-pay | Admitting: Internal Medicine

## 2020-10-25 ENCOUNTER — Other Ambulatory Visit: Payer: Self-pay | Admitting: Adult Health

## 2020-10-25 DIAGNOSIS — I10 Essential (primary) hypertension: Secondary | ICD-10-CM

## 2020-10-25 MED ORDER — LOSARTAN POTASSIUM-HCTZ 100-25 MG PO TABS: ORAL_TABLET | ORAL | 3 refills | Status: DC

## 2020-11-06 ENCOUNTER — Ambulatory Visit: Payer: Medicare Other | Admitting: Adult Health Nurse Practitioner

## 2020-11-18 ENCOUNTER — Other Ambulatory Visit: Payer: Self-pay | Admitting: Adult Health Nurse Practitioner

## 2020-11-18 DIAGNOSIS — M797 Fibromyalgia: Secondary | ICD-10-CM

## 2020-11-25 ENCOUNTER — Ambulatory Visit (INDEPENDENT_AMBULATORY_CARE_PROVIDER_SITE_OTHER): Payer: Medicare Other | Admitting: Adult Health Nurse Practitioner

## 2020-11-25 ENCOUNTER — Other Ambulatory Visit: Payer: Self-pay

## 2020-11-25 ENCOUNTER — Encounter: Payer: Self-pay | Admitting: Adult Health Nurse Practitioner

## 2020-11-25 VITALS — BP 120/74 | HR 60 | Temp 97.6°F | Ht 62.0 in | Wt 234.0 lb

## 2020-11-25 DIAGNOSIS — E2839 Other primary ovarian failure: Secondary | ICD-10-CM | POA: Diagnosis not present

## 2020-11-25 DIAGNOSIS — R6889 Other general symptoms and signs: Secondary | ICD-10-CM | POA: Diagnosis not present

## 2020-11-25 DIAGNOSIS — Z79899 Other long term (current) drug therapy: Secondary | ICD-10-CM

## 2020-11-25 DIAGNOSIS — Z0001 Encounter for general adult medical examination with abnormal findings: Secondary | ICD-10-CM

## 2020-11-25 DIAGNOSIS — E782 Mixed hyperlipidemia: Secondary | ICD-10-CM

## 2020-11-25 DIAGNOSIS — I10 Essential (primary) hypertension: Secondary | ICD-10-CM

## 2020-11-25 DIAGNOSIS — Z6841 Body Mass Index (BMI) 40.0 and over, adult: Secondary | ICD-10-CM

## 2020-11-25 NOTE — Progress Notes (Addendum)
MEDICARE ANNUAL WELLNESS  Assessment and Plan:   Encounter for general adult medical examination with abnormal findings 1 year Will get colonoscopy in Jan  Essential hypertension, benign - continue medications: Losartan100mg  / HCTZ 25mg  daily in am. DASH diet, exercise and monitor at home. Call if greater than 130/80.  -     CBC with Differential/Platelet -     COMPLETE METABOLIC PANEL WITH GFR  Mixed hyperlipidemia check lipids decrease fatty foods increase activity.  -     Lipid panel  Morbid obesity ([place]) BMI 42 Patient down 7+lbs since last OV Encouraged healthy behaviors - follow up 3 months for progress monitoring - increase veggies, decrease carbs - long discussion about weight loss, diet, and exercise   Abnormal glucose Discussed disease progression and risks Discussed diet/exercise, weight management and risk modification  Fibromyalgia Continue medications, Cymbalta 30mg  helping with this alos  H/O gastric bypass Monitor levels, CPE Taking B12 supplementation  Adjustment disorder with mixed anxiety and depressed mood Continue medications, well controlled Cymbalta 30mg  daily  Medication management Continued  Vitamin D deficiency Continue supplementation Defer Vit D this OV  Estrogen deficiency Screening Bone Density Order placed today for DEXA, get with yearly mammogram   Continue diet and meds as discussed. Further disposition pending results of labs.  Further disposition pending results if labs check today. Discussed med's effects and SE's.   Over 30 minutes of face to face interview, exam, counseling, chart review, and critical decision making was performed.   Plan:   During the course of the visit the patient was educated and counseled about appropriate screening and preventive services including:    Pneumococcal vaccine   Prevnar 13  Influenza vaccine  Td vaccine  Screening electrocardiogram  Bone densitometry  screening  Colorectal cancer screening  Diabetes screening  Glaucoma screening  Nutrition counseling   Advanced directives: requested   HPI  65 y.o. female  presents for CPE and follow up withHTN, HLD, prediabetes and vitamin D.  She has fibromyalgia and depression, has had negative autoimmune work up and rheumatology work up as well that was unremarkable at the time.  She is on cymbalta 30mg  daily.   She was using Ozempic 1mg  weekly and losing weight, insurance would no longer cover/ and difficulty obtaining medication.  She has changes her eating habits and decreased sodas and sugars.  She is down 7lbs since last OV 09/2020.  She has been strict with her food intake and portions.  BMI is Body mass index is 42.8 kg/m., she is working on diet and exercise. Wt Readings from Last 3 Encounters:  11/25/20 234 lb (106.1 kg)  10/02/20 241 lb (109.3 kg)  08/12/20 243 lb (110.2 kg)     Her blood pressure has been controlled at home, today their BP is BP: 120/74   She does not workout. She denies chest pain, shortness of breath, dizziness.    She is on cholesterol medication and denies myalgias. Her cholesterol is at goal. The cholesterol last visit was:   Lab Results  Component Value Date   CHOL 161 11/25/2020   HDL 61 11/25/2020   LDLCALC 87 11/25/2020   TRIG 51 11/25/2020   CHOLHDL 2.6 11/25/2020    She has not been working on diet and exercise for prediabetes, and denies foot ulcerations, hyperglycemia, hypoglycemia , increased appetite, nausea, paresthesia of the feet, polydipsia, polyuria, visual disturbances, vomiting and weight loss. Last A1C in the office was:  Lab Results  Component Value Date  HGBA1C 5.9 (H) 05/29/2020   Patient is on Vitamin D supplement.  Lab Results  Component Value Date   VD25OH 38 05/29/2020       Current Medications:    Current Outpatient Medications (Cardiovascular):  .  losartan-hydrochlorothiazide (HYZAAR) 100-25 MG tablet, Take 1  tablet Daily for BP     Current Outpatient Medications (Other):  Marland Kitchen  Calcium Carbonate-Vit D-Min (CALCIUM 1200 PO), Take by mouth daily. .  DULoxetine (CYMBALTA) 30 MG capsule, TAKE 1 CAPSULE BY MOUTH EVERY DAY .  Multiple Vitamins-Minerals (MULTIVITAMIN WITH MINERALS) tablet, Take 1 tablet by mouth daily.  MEDICARE WELLNESS OBJECTIVES: Physical activity: Current Exercise Habits: The patient does not participate in regular exercise at present, Exercise limited by: orthopedic condition(s) Cardiac risk factors: Cardiac Risk Factors include: advanced age (>55men, >56 women);obesity (BMI >30kg/m2);sedentary lifestyle Depression/mood screen:   Depression screen [facility] 2/9 11/25/2020  Decreased Interest 0  Down, Depressed, Hopeless 0  PHQ - 2 Score 0    ADLs:  In your present state of health, do you have any difficulty performing the following activities: 11/25/2020  Hearing? N  Vision? N  Difficulty concentrating or making decisions? N  Walking or climbing stairs? N  Dressing or bathing? N  Doing errands, shopping? N  Preparing Food and eating ? N  Using the Toilet? N  In the past six months, have you accidently leaked urine? N  Do you have problems with loss of bowel control? N  Managing your Medications? N  Managing your Finances? N  Housekeeping or managing your Housekeeping? N  Some recent data might be hidden     Cognitive Testing  Alert? Yes  Normal Appearance?Yes  Oriented to person? Yes  Place? Yes   Time? Yes  Recall of three objects?  Yes  Can perform simple calculations? Yes  Displays appropriate judgment?Yes  Can read the correct time from a watch face?Yes  EOL planning: Does Patient Have a Medical Advance Directive?: No Does patient want to make changes to medical advance directive?: Yes (MAU/Ambulatory/Procedural Areas - Information given)      Medical History:  Past Medical History:  Diagnosis Date  . Adjustment disorder with mixed anxiety and depressed mood  11/25/2013  . Fibromyalgia   . H/O gastric bypass   . Mixed hyperlipidemia 11/25/2013   Immunization History  Administered Date(s) Administered  . Influenza Inj Mdck Quad With Preservative 08/29/2019  . PFIZER(Purple Top)SARS-COV-2 Vaccination 01/12/2020, 02/05/2020, 10/03/2020  . Pneumococcal-Unspecified 01/01/2004  . Td 10/18/2000  . Tdap 07/05/2013  . Zoster 06/03/2017   Health Maintenance  Topic Date Due  . COLONOSCOPY (Pts 45-71yrs Insurance coverage will need to be confirmed)  Never done  . INFLUENZA VACCINE  05/18/2020  . DEXA SCAN  Never done  . PNA vac Low Risk Adult (1 of 2 - PCV13) 10/18/2020  . PAP SMEAR-Modifier  11/22/2021  . MAMMOGRAM  12/30/2021  . TETANUS/TDAP  07/06/2023  . COVID-19 Vaccine  Completed  . Hepatitis C Screening  Completed  . HIV Screening  Completed    MGM 12/2019 DEXA: Will place order today. Colonoscopy- NEVER- after counseling will get colonoscopy states will get in Jan.  PAP- 11/2018 Influenza get at pharmacy    Sleep study 08/2017- NEGATIVE SLEEP STUDY Echo 2006 Eye exam 09/2018 CXR 2008 Eye Exam: 2020 Dental Exam: Q6Months SARS-COV2-[employer]-Booster complete  Review of Systems:  Review of Systems  Constitutional: Negative for chills, fever and malaise/fatigue.  HENT: Negative for congestion, ear pain and sore throat.  Eyes: Negative.   Respiratory: Negative for cough, shortness of breath and wheezing.   Cardiovascular: Negative for chest pain, palpitations and leg swelling.  Gastrointestinal: Negative for abdominal pain, blood in stool, constipation, diarrhea, heartburn and melena.  Genitourinary: Negative.   Musculoskeletal: Negative for joint pain.  Skin: Negative for rash.  Neurological: Negative for dizziness, tingling, tremors, sensory change, loss of consciousness and headaches.  Psychiatric/Behavioral: Positive for depression. Negative for substance abuse and suicidal ideas. The patient is nervous/anxious. The patient  does not have insomnia.    Allergies Allergies  Allergen Reactions  . E-Mycin [Erythromycin]     GI upset    SURGICAL HISTORY She  has a past surgical history that includes Gastric bypass; Tonsillectomy; Appendectomy; Wisdom tooth extraction; salivary stones removed; and Exploratory laparotomy (2006). FAMILY HISTORY Her family history includes Alzheimer's disease in her father; Emphysema in her mother; Heart disease in her father; Multiple sclerosis in her sister; Osteoporosis in her mother. SOCIAL HISTORY She  reports that she has never smoked. She has never used smokeless tobacco. She reports previous alcohol use. She reports that she does not use drugs.   Physical Exam: BP 120/74   Pulse 60   Temp 97.6 F (36.4 C)   Ht 5\' 2"  (1.575 m)   Wt 234 lb (106.1 kg)   SpO2 96%   BMI 42.80 kg/m  Wt Readings from Last 3 Encounters:  11/25/20 234 lb (106.1 kg)  10/02/20 241 lb (109.3 kg)  08/12/20 243 lb (110.2 kg)    General Appearance: Well nourished well developed, in no apparent distress. Eyes: PERRLA, EOMs, conjunctiva no swelling or erythema ENT/Mouth: Ear canals normal without obstruction, swelling, erythma, discharge.  TMs normal bilaterally.  Oropharynx moist, clear, without exudate, or postoropharyngeal swelling. Neck: Supple, thyroid normal,no cervical adenopathy  Respiratory: Respiratory effort normal, Breath sounds clear A&P without rhonchi, wheeze, or rale.  No retractions, no accessory usage. Cardio: RRR with no MRGs. Brisk peripheral pulses without edema.  Abdomen: Soft, + BS,  Non tender, no guarding, rebound, hernias, masses. Musculoskeletal: Full ROM, 5/5 strength, Normal gait,  Skin: Warm, dry without rashes, lesions, ecchymosis.  Neuro: Awake and oriented X 3, Cranial nerves intact. Normal muscle tone, no cerebellar symptoms. Psych: Normal affect, Insight and Judgment appropriate.    Medicare Attestation I have personally reviewed: The patient's medical and  social history Their use of alcohol, tobacco or illicit drugs Their current medications and supplements The patient's functional ability including ADLs,fall risks, home safety risks, cognitive, and hearing and visual impairment Diet and physical activities Evidence for depression or mood disorders   The patient's weight, height, BMI, and visual acuity have been recorded in the chart.  I have made referrals, counseling, and provided education to the patient based on review of the above and I have provided the patient with a written personalized care plan for preventive services.      Garnet Sierras, NP 12:33 PM [facility]

## 2020-11-26 LAB — CBC WITH DIFFERENTIAL/PLATELET
Absolute Monocytes: 320 cells/uL (ref 200–950)
Basophils Absolute: 41 cells/uL (ref 0–200)
Basophils Relative: 0.6 %
Eosinophils Absolute: 27 cells/uL (ref 15–500)
Eosinophils Relative: 0.4 %
HCT: 43.7 % (ref 35.0–45.0)
Hemoglobin: 14.7 g/dL (ref 11.7–15.5)
Lymphs Abs: 1958 cells/uL (ref 850–3900)
MCH: 28.8 pg (ref 27.0–33.0)
MCHC: 33.6 g/dL (ref 32.0–36.0)
MCV: 85.7 fL (ref 80.0–100.0)
MPV: 12.6 fL — ABNORMAL HIGH (ref 7.5–12.5)
Monocytes Relative: 4.7 %
Neutro Abs: 4454 cells/uL (ref 1500–7800)
Neutrophils Relative %: 65.5 %
Platelets: 250 10*3/uL (ref 140–400)
RBC: 5.1 10*6/uL (ref 3.80–5.10)
RDW: 13.4 % (ref 11.0–15.0)
Total Lymphocyte: 28.8 %
WBC: 6.8 10*3/uL (ref 3.8–10.8)

## 2020-11-26 LAB — COMPLETE METABOLIC PANEL WITH GFR
AG Ratio: 1.6 (calc) (ref 1.0–2.5)
ALT: 14 U/L (ref 6–29)
AST: 18 U/L (ref 10–35)
Albumin: 4.1 g/dL (ref 3.6–5.1)
Alkaline phosphatase (APISO): 59 U/L (ref 37–153)
BUN: 14 mg/dL (ref 7–25)
CO2: 31 mmol/L (ref 20–32)
Calcium: 9.7 mg/dL (ref 8.6–10.4)
Chloride: 101 mmol/L (ref 98–110)
Creat: 0.66 mg/dL (ref 0.50–0.99)
GFR, Est African American: 107 mL/min/{1.73_m2} (ref >=60)
GFR, Est Non African American: 93 mL/min/{1.73_m2} (ref >=60)
Globulin: 2.6 g/dL (calc) (ref 1.9–3.7)
Glucose, Bld: 80 mg/dL (ref 65–99)
Potassium: 4.5 mmol/L (ref 3.5–5.3)
Sodium: 140 mmol/L (ref 135–146)
Total Bilirubin: 0.4 mg/dL (ref 0.2–1.2)
Total Protein: 6.7 g/dL (ref 6.1–8.1)

## 2020-11-26 LAB — LIPID PANEL
Cholesterol: 161 mg/dL (ref <200)
HDL: 61 mg/dL (ref >=50)
LDL Cholesterol (Calc): 87 mg/dL (calc)
Non-HDL Cholesterol (Calc): 100 mg/dL (calc) (ref <130)
Total CHOL/HDL Ratio: 2.6 (calc) (ref <5.0)
Triglycerides: 51 mg/dL (ref <150)

## 2020-12-01 NOTE — Patient Instructions (Addendum)
   You are due for Bone Density scan.  You can schedule this with your mammogram, the order has already been entered.  You are due for Colonoscopy.  Keep up the good work with your weight loss!!!  Continue to make small lifestyle changes this ensures success in long term management.

## 2020-12-03 ENCOUNTER — Encounter: Payer: BC Managed Care – PPO | Admitting: Physician Assistant

## 2020-12-18 NOTE — Addendum Note (Signed)
Addended byGarnet Sierras A on: 12/18/2020 01:44 PM   Modules accepted: Orders

## 2021-01-25 ENCOUNTER — Encounter (HOSPITAL_COMMUNITY): Payer: Self-pay | Admitting: *Deleted

## 2021-01-25 ENCOUNTER — Ambulatory Visit (INDEPENDENT_AMBULATORY_CARE_PROVIDER_SITE_OTHER): Payer: Medicare Other

## 2021-01-25 ENCOUNTER — Ambulatory Visit (HOSPITAL_COMMUNITY)
Admission: EM | Admit: 2021-01-25 | Discharge: 2021-01-25 | Disposition: A | Discharge status: Home / Self Care | Payer: Medicare Other | Attending: Family Medicine | Admitting: Family Medicine

## 2021-01-25 DIAGNOSIS — M79642 Pain in left hand: Secondary | ICD-10-CM

## 2021-01-25 DIAGNOSIS — M79641 Pain in right hand: Secondary | ICD-10-CM

## 2021-01-25 DIAGNOSIS — M542 Cervicalgia: Secondary | ICD-10-CM

## 2021-01-25 DIAGNOSIS — R0781 Pleurodynia: Secondary | ICD-10-CM

## 2021-01-25 DIAGNOSIS — S60222A Contusion of left hand, initial encounter: Secondary | ICD-10-CM | POA: Diagnosis not present

## 2021-01-25 DIAGNOSIS — S20211A Contusion of right front wall of thorax, initial encounter: Secondary | ICD-10-CM

## 2021-01-25 DIAGNOSIS — M79632 Pain in left forearm: Secondary | ICD-10-CM | POA: Diagnosis not present

## 2021-01-25 DIAGNOSIS — S62524A Nondisplaced fracture of distal phalanx of right thumb, initial encounter for closed fracture: Secondary | ICD-10-CM | POA: Diagnosis not present

## 2021-01-25 DIAGNOSIS — T148XXA Other injury of unspecified body region, initial encounter: Secondary | ICD-10-CM

## 2021-01-25 DIAGNOSIS — S5012XA Contusion of left forearm, initial encounter: Secondary | ICD-10-CM | POA: Diagnosis not present

## 2021-01-25 HISTORY — DX: Cutaneous abscess of abdominal wall: L02.211
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
## 2021-02-04 ENCOUNTER — Ambulatory Visit (INDEPENDENT_AMBULATORY_CARE_PROVIDER_SITE_OTHER): Payer: Medicare Other | Admitting: Adult Health

## 2021-02-04 ENCOUNTER — Encounter: Payer: Self-pay | Admitting: Adult Health

## 2021-02-04 ENCOUNTER — Other Ambulatory Visit: Payer: Self-pay

## 2021-02-04 VITALS — BP 130/84 | HR 65 | Temp 97.5°F | Wt 232.0 lb

## 2021-02-04 DIAGNOSIS — I10 Essential (primary) hypertension: Secondary | ICD-10-CM | POA: Diagnosis not present

## 2021-02-04 DIAGNOSIS — L989 Disorder of the skin and subcutaneous tissue, unspecified: Secondary | ICD-10-CM

## 2021-02-04 DIAGNOSIS — S62524D Nondisplaced fracture of distal phalanx of right thumb, subsequent encounter for fracture with routine healing: Secondary | ICD-10-CM

## 2021-02-04 MED ORDER — OLMESARTAN MEDOXOMIL 40 MG PO TABS: 40.0000 mg | ORAL_TABLET | Freq: Every day | ORAL | 0 refills | Status: DC

## 2021-02-04 NOTE — Patient Instructions (Signed)
   Try hydrocollid patch without medication to forehead area

## 2021-02-19 ENCOUNTER — Other Ambulatory Visit: Payer: Self-pay | Admitting: Adult Health Nurse Practitioner

## 2021-02-19 DIAGNOSIS — M797 Fibromyalgia: Secondary | ICD-10-CM

## 2021-05-16 ENCOUNTER — Other Ambulatory Visit: Payer: Self-pay | Admitting: Adult Health

## 2021-05-18 ENCOUNTER — Other Ambulatory Visit: Payer: Self-pay | Admitting: Adult Health

## 2021-05-18 DIAGNOSIS — M797 Fibromyalgia: Secondary | ICD-10-CM

## 2021-05-18 MED ORDER — BUSPIRONE HCL 5 MG PO TABS: ORAL_TABLET | ORAL | 0 refills | Status: DC

## 2021-05-18 MED ORDER — DULOXETINE HCL 30 MG PO CPEP: ORAL_CAPSULE | ORAL | 3 refills | Status: DC

## 2021-06-01 ENCOUNTER — Encounter: Payer: BC Managed Care – PPO | Admitting: Adult Health Nurse Practitioner

## 2021-06-01 ENCOUNTER — Encounter: Payer: BC Managed Care – PPO | Admitting: Adult Health

## 2021-06-03 DIAGNOSIS — E611 Iron deficiency: Secondary | ICD-10-CM | POA: Insufficient documentation

## 2021-06-03 DIAGNOSIS — E538 Deficiency of other specified B group vitamins: Secondary | ICD-10-CM | POA: Insufficient documentation
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
## 2021-06-04 ENCOUNTER — Ambulatory Visit (INDEPENDENT_AMBULATORY_CARE_PROVIDER_SITE_OTHER): Payer: Medicare Other | Admitting: Adult Health

## 2021-06-04 ENCOUNTER — Encounter: Payer: Self-pay | Admitting: Adult Health

## 2021-06-04 ENCOUNTER — Other Ambulatory Visit: Payer: Self-pay

## 2021-06-04 VITALS — BP 138/82 | HR 84 | Temp 98.0°F | Resp 17 | Ht 62.0 in | Wt 253.0 lb

## 2021-06-04 DIAGNOSIS — Z9884 Bariatric surgery status: Secondary | ICD-10-CM

## 2021-06-04 DIAGNOSIS — Z1211 Encounter for screening for malignant neoplasm of colon: Secondary | ICD-10-CM

## 2021-06-04 DIAGNOSIS — M797 Fibromyalgia: Secondary | ICD-10-CM

## 2021-06-04 DIAGNOSIS — Z136 Encounter for screening for cardiovascular disorders: Secondary | ICD-10-CM

## 2021-06-04 DIAGNOSIS — E611 Iron deficiency: Secondary | ICD-10-CM

## 2021-06-04 DIAGNOSIS — E782 Mixed hyperlipidemia: Secondary | ICD-10-CM

## 2021-06-04 DIAGNOSIS — Z79899 Other long term (current) drug therapy: Secondary | ICD-10-CM

## 2021-06-04 DIAGNOSIS — M25512 Pain in left shoulder: Secondary | ICD-10-CM

## 2021-06-04 DIAGNOSIS — I10 Essential (primary) hypertension: Secondary | ICD-10-CM | POA: Diagnosis not present

## 2021-06-04 DIAGNOSIS — R7309 Other abnormal glucose: Secondary | ICD-10-CM

## 2021-06-04 DIAGNOSIS — F4323 Adjustment disorder with mixed anxiety and depressed mood: Secondary | ICD-10-CM

## 2021-06-04 DIAGNOSIS — L989 Disorder of the skin and subcutaneous tissue, unspecified: Secondary | ICD-10-CM

## 2021-06-04 DIAGNOSIS — Z131 Encounter for screening for diabetes mellitus: Secondary | ICD-10-CM

## 2021-06-04 DIAGNOSIS — Z1329 Encounter for screening for other suspected endocrine disorder: Secondary | ICD-10-CM

## 2021-06-04 DIAGNOSIS — Z0001 Encounter for general adult medical examination with abnormal findings: Secondary | ICD-10-CM

## 2021-06-04 DIAGNOSIS — E538 Deficiency of other specified B group vitamins: Secondary | ICD-10-CM

## 2021-06-04 DIAGNOSIS — E559 Vitamin D deficiency, unspecified: Secondary | ICD-10-CM

## 2021-06-04 MED ORDER — GABAPENTIN 300 MG PO CAPS: ORAL_CAPSULE | ORAL | 0 refills | Status: DC

## 2021-06-04 MED ORDER — BUSPIRONE HCL 10 MG PO TABS: ORAL_TABLET | ORAL | 0 refills | Status: DC

## 2021-06-04 MED ORDER — PREDNISONE 20 MG PO TABS: ORAL_TABLET | ORAL | 0 refills | Status: DC

## 2021-06-05 ENCOUNTER — Encounter: Payer: Self-pay | Admitting: Adult Health

## 2021-06-05 LAB — COMPLETE METABOLIC PANEL WITH GFR
AG Ratio: 1.5 (calc) (ref 1.0–2.5)
ALT: 15 U/L (ref 6–29)
AST: 17 U/L (ref 10–35)
Albumin: 4.1 g/dL (ref 3.6–5.1)
Alkaline phosphatase (APISO): 67 U/L (ref 37–153)
BUN: 13 mg/dL (ref 7–25)
CO2: 28 mmol/L (ref 20–32)
Calcium: 9.9 mg/dL (ref 8.6–10.4)
Chloride: 104 mmol/L (ref 98–110)
Creat: 0.9 mg/dL (ref 0.50–1.05)
Globulin: 2.8 g/dL (calc) (ref 1.9–3.7)
Glucose, Bld: 78 mg/dL (ref 65–99)
Potassium: 5.1 mmol/L (ref 3.5–5.3)
Sodium: 143 mmol/L (ref 135–146)
Total Bilirubin: 0.2 mg/dL (ref 0.2–1.2)
Total Protein: 6.9 g/dL (ref 6.1–8.1)
eGFR: 71 mL/min/{1.73_m2} (ref >=60)

## 2021-06-05 LAB — MAGNESIUM: Magnesium: 2.2 mg/dL (ref 1.5–2.5)

## 2021-06-05 LAB — CBC WITH DIFFERENTIAL/PLATELET
Absolute Monocytes: 405 cells/uL (ref 200–950)
Basophils Absolute: 41 cells/uL (ref 0–200)
Basophils Relative: 0.5 %
Eosinophils Absolute: 41 cells/uL (ref 15–500)
Eosinophils Relative: 0.5 %
HCT: 43 % (ref 35.0–45.0)
Hemoglobin: 14.5 g/dL (ref 11.7–15.5)
Lymphs Abs: 1774 cells/uL (ref 850–3900)
MCH: 29.4 pg (ref 27.0–33.0)
MCHC: 33.7 g/dL (ref 32.0–36.0)
MCV: 87 fL (ref 80.0–100.0)
MPV: 11.8 fL (ref 7.5–12.5)
Monocytes Relative: 5 %
Neutro Abs: 5840 cells/uL (ref 1500–7800)
Neutrophils Relative %: 72.1 %
Platelets: 263 10*3/uL (ref 140–400)
RBC: 4.94 10*6/uL (ref 3.80–5.10)
RDW: 12.8 % (ref 11.0–15.0)
Total Lymphocyte: 21.9 %
WBC: 8.1 10*3/uL (ref 3.8–10.8)

## 2021-06-05 LAB — URINALYSIS, ROUTINE W REFLEX MICROSCOPIC
Bilirubin Urine: NEGATIVE
Glucose, UA: NEGATIVE
Hgb urine dipstick: NEGATIVE
Ketones, ur: NEGATIVE
Leukocytes,Ua: NEGATIVE
Nitrite: NEGATIVE
Protein, ur: NEGATIVE
Specific Gravity, Urine: 1.005 (ref 1.001–1.035)
pH: 6 (ref 5.0–8.0)

## 2021-06-05 LAB — IRON,TIBC AND FERRITIN PANEL
%SAT: 17 % (calc) (ref 16–45)
Ferritin: 33 ng/mL (ref 16–288)
Iron: 57 ug/dL (ref 45–160)
TIBC: 345 mcg/dL (calc) (ref 250–450)

## 2021-06-05 LAB — INSULIN, RANDOM: Insulin: 7.7 u[IU]/mL

## 2021-06-05 LAB — VITAMIN B12: Vitamin B-12: 1077 pg/mL (ref 200–1100)

## 2021-06-05 LAB — HEMOGLOBIN A1C
Hgb A1c MFr Bld: 5.8 % of total Hgb — ABNORMAL HIGH (ref <5.7)
Mean Plasma Glucose: 120 mg/dL
eAG (mmol/L): 6.6 mmol/L

## 2021-06-05 LAB — LIPID PANEL
Cholesterol: 191 mg/dL (ref <200)
HDL: 72 mg/dL (ref >=50)
LDL Cholesterol (Calc): 96 mg/dL (calc)
Non-HDL Cholesterol (Calc): 119 mg/dL (calc) (ref <130)
Total CHOL/HDL Ratio: 2.7 (calc) (ref <5.0)
Triglycerides: 124 mg/dL (ref <150)

## 2021-06-05 LAB — TSH: TSH: 1.37 mIU/L (ref 0.40–4.50)

## 2021-06-05 LAB — MICROALBUMIN / CREATININE URINE RATIO
Creatinine, Urine: 19 mg/dL — ABNORMAL LOW (ref 20–275)
Microalb, Ur: <0.2 mg/dL

## 2021-06-05 LAB — VITAMIN D 25 HYDROXY (VIT D DEFICIENCY, FRACTURES): Vit D, 25-Hydroxy: 45 ng/mL (ref 30–100)

## 2021-06-11 ENCOUNTER — Other Ambulatory Visit: Payer: Self-pay | Admitting: Adult Health

## 2021-06-11 DIAGNOSIS — M25512 Pain in left shoulder: Secondary | ICD-10-CM

## 2021-06-11 MED ORDER — PREDNISONE 20 MG PO TABS: ORAL_TABLET | ORAL | 0 refills | Status: DC

## 2021-06-18 ENCOUNTER — Other Ambulatory Visit: Payer: Self-pay

## 2021-06-18 ENCOUNTER — Ambulatory Visit (INDEPENDENT_AMBULATORY_CARE_PROVIDER_SITE_OTHER): Payer: Medicare Other | Admitting: Adult Health

## 2021-06-18 ENCOUNTER — Encounter: Payer: Self-pay | Admitting: Adult Health

## 2021-06-18 VITALS — BP 128/70 | HR 79 | Temp 97.2°F | Wt 257.0 lb

## 2021-06-18 DIAGNOSIS — L989 Disorder of the skin and subcutaneous tissue, unspecified: Secondary | ICD-10-CM | POA: Diagnosis not present

## 2021-06-18 DIAGNOSIS — M7542 Impingement syndrome of left shoulder: Secondary | ICD-10-CM | POA: Insufficient documentation

## 2021-06-18 DIAGNOSIS — M797 Fibromyalgia: Secondary | ICD-10-CM | POA: Diagnosis not present

## 2021-06-18 DIAGNOSIS — F4323 Adjustment disorder with mixed anxiety and depressed mood: Secondary | ICD-10-CM

## 2021-06-18 NOTE — Patient Instructions (Addendum)
Tumeric + black pepper fruit/bioprene  Tart cherry supplement -    Antiinflammatory diet  Doctors are learning that one of the best ways to reduce inflammation lies not in the medicine cabinet, but in the refrigerator.  By following an anti-inflammatory diet you can fight off inflammation for good.  What does an anti-inflammatory diet do? Your immune system becomes activated when your body recognizes anything that is foreign--such as an invading microbe, plant pollen, or chemical. This often triggers a process called inflammation. Intermittent bouts of inflammation directed at truly threatening invaders protect your health.  However, sometimes inflammation persists, day in and day out, even when you are not threatened by a foreign invader. That's when inflammation can become your enemy. Many major diseases that plague us--including cancer, heart disease, diabetes, arthritis, depression, and Alzheimer's--have been linked to chronic inflammation.  One of the most powerful tools to combat inflammation comes not from the pharmacy, but from the grocery store.   Protect yourself from the damage of chronic inflammation. Science has proven that chronic, low-grade inflammation can turn into a silent killer that contributes to cardiovascular disease, cancer, type 2 diabetes and other conditions.   Choose the right anti-inflammatory foods, and you may be able to reduce your risk of illness. Consistently pick the wrong ones, and you could accelerate the inflammatory disease process.     Foods that cause inflammation Try to avoid or limit these foods as much as possible:  refined carbohydrates, such as white bread and pastries [place] fries and other fried foods soda and other sugar-sweetened beverages red meat (burgers, steaks) and processed meat (hot dogs, sausage) margarine, shortening, and lard  The health risks of inflammatory foods Not surprisingly, the same foods on an inflammation diet are  generally considered bad for our health, including sodas and refined carbohydrates, as well as red meat and processed meats.  "Some of the foods that have been associated with an increased risk for chronic diseases such as type 2 diabetes and heart disease are also associated with excess inflammation," Dr. Melanee Spry says. "It's not surprising, since inflammation is an important underlying mechanism for the development of these diseases."  Unhealthy foods also contribute to weight gain, which is itself a risk factor for inflammation. Yet in several studies, even after researchers took obesity into account, the link between foods and inflammation remained, which suggests weight gain isn't the sole driver. "Some of the food components or ingredients may have independent effects on inflammation over and above increased caloric intake," Dr. Melanee Spry says.  Anti-inflammatory foods An anti-inflammatory diet should include these foods:  tomatoes olive oil green leafy vegetables, such as spinach, kale, and collards nuts like almonds and walnuts fatty fish like salmon, mackerel, tuna, and sardines fruits such as strawberries, blueberries, cherries, and oranges  Benefits of anti-inflammatory foods On the flip side are beverages and foods that reduce inflammation, in particular fruits and vegetables such as blueberries, apples, and leafy greens that are high in natural antioxidants and polyphenols--protective compounds found in plants.  Studies have also associated nuts with reduced markers of inflammation and a lower risk of cardiovascular disease and diabetes. Coffee, which contains polyphenols and other anti-inflammatory compounds, may protect against inflammation, as well.  Anti-inflammatory diet To reduce levels of inflammation, aim for an overall healthy diet. If you're looking for an eating plan that closely follows the tenets of anti-inflammatory eating, consider the Mediterranean diet, which is high in fruits,  vegetables, nuts, whole grains, fish, and healthy oils.  In  addition to lowering inflammation, a more natural, less processed diet can have noticeable effects on your physical and emotional health and overall quality of life.   [email or web address]

## 2021-06-18 NOTE — Progress Notes (Signed)
Assessment and Plan:  Nicole Padilla was seen today for follow-up.  Diagnoses and all orders for this visit:  Adjustment disorder with mixed anxiety and depressed mood Much improved Well managed by current regimen; continue medications Stress management techniques discussed, increase water, good sleep hygiene discussed, increase exercise, and increase veggies.   Fibromyalgia Discussed lifestyle at length; antiinflammatory diet reviewed and given  Plan to start minimally processed plant based mediterranean diet with weight loss She can also try turmeric + bioprene/black pepper fruit, tart cherry supplement Continue cymbalta   Rotator cuff impingement syndrome of left shoulder Continue follow up Dr. Rip Harbour, PT as recommended  Lesion of face (left temple) Cannot r/o skin cancer, however with colloidal dressing is improving; will hold off on any intervention for now and monitor; she will follow up if not resolving in 4-6 weeks and plan to refer to skin surgery center for biopsy resection if needed.   Further disposition pending results of labs. Discussed med's effects and SE's.   Over 20 minutes of exam, counseling, chart review, and critical decision making was performed.   Future Appointments  Date Time Provider [place]  12/02/2021  9:30 AM Liane Comber, NP [facility] None  06/08/2022 10:00 AM Liane Comber, NP [facility] None    ------------------------------------------------------------------------------------------------------------------   HPI BP 128/70   Pulse 79   Temp (!) 97.2 F (36.2 C)   Wt 257 lb (116.6 kg)   SpO2 99%   BMI 47.01 kg/m  65 y.o.female presents for 3 week follow up on face lesions, also for left shoulder pain and mood/anxiety.   Last OV she reported left shoulder pain persistent/progressive, suspect for possibe impingement and pending ortho evaluation gave steroid taper and gabapentin, reports this did improve significantly. Saw Dr. Rip Harbour  and had injections with some benefit, will be starting PT next week.   She has FM and depression, has had negative autoimmune work up and has seen rheum, she is on cymbalta 30 mg, more recently buspar 5 mg PRN was added which helped initially, lots of stress with breakthrough, we increased to 10 mg TID last OV. She reports mood is significantly improved.   She had scabbing wound/lesion to left temp, had been picking at it for many months, she has been covering with hydrocolloidal dressing since then and notes this doe appear to be improving gradually.   Past Medical History:  Diagnosis Date   Abdominal wall abscess    Adjustment disorder with mixed anxiety and depressed mood 11/25/2013   Fibromyalgia    H/O gastric bypass    Mixed hyperlipidemia 11/25/2013     Allergies  Allergen Reactions   E-Mycin [Erythromycin]     GI upset    Current Outpatient Medications on File Prior to Visit  Medication Sig   busPIRone (BUSPAR) 10 MG tablet Take 1 tab three times a day as needed for anxiety.   Calcium Carbonate-Vit D-Min (CALCIUM 1200 PO) Take by mouth daily.   DULoxetine (CYMBALTA) 30 MG capsule Take  1 capsule  Daily  for Mood & Chronic Pain   gabapentin (NEURONTIN) 300 MG capsule Take 1 cap three times a day as needed for pain.   Multiple Vitamins-Minerals (MULTIVITAMIN WITH MINERALS) tablet Take 1 tablet by mouth daily.   olmesartan (BENICAR) 40 MG tablet Take  1 tablet  Daily  for BP   predniSONE (DELTASONE) 20 MG tablet 3 tablets daily with food for 3 days, 2 tabs daily for 3 days, 1 tab a day for 5 days.  No current facility-administered medications on file prior to visit.    ROS: all negative except above.   Physical Exam:  BP 128/70   Pulse 79   Temp (!) 97.2 F (36.2 C)   Wt 257 lb (116.6 kg)   SpO2 99%   BMI 47.01 kg/m   General Appearance: Well nourished, morbidly obese female, in no apparent distress. Eyes: conjunctiva no swelling or erythema ENT/Mouth: Mask in  place; Hearing normal.  Neck: Supple Respiratory: Respiratory effort normal Cardio: Appears well perfused Musculoskeletal: normal gait.  Skin: Warm, dry; no rash; see photo for left temple lesion/wound Neuro: Normal muscle tone Psych: Awake and oriented X 3, normal affect, Insight and Judgment appropriate.        Izora Ribas, NP 4:24 PM [facility]

## 2021-09-30 ENCOUNTER — Other Ambulatory Visit: Payer: Self-pay | Admitting: Adult Health

## 2021-09-30 DIAGNOSIS — F4323 Adjustment disorder with mixed anxiety and depressed mood: Secondary | ICD-10-CM

## 2021-10-26 ENCOUNTER — Other Ambulatory Visit: Payer: Self-pay | Admitting: Adult Health Nurse Practitioner

## 2021-10-26 DIAGNOSIS — I10 Essential (primary) hypertension: Secondary | ICD-10-CM

## 2021-11-03 ENCOUNTER — Encounter: Payer: Self-pay | Admitting: Adult Health

## 2021-11-04 ENCOUNTER — Other Ambulatory Visit: Payer: Self-pay

## 2021-11-04 ENCOUNTER — Ambulatory Visit: Payer: Medicare Other | Admitting: Adult Health

## 2021-11-04 ENCOUNTER — Encounter: Payer: Self-pay | Admitting: Adult Health

## 2021-11-04 DIAGNOSIS — U071 COVID-19: Secondary | ICD-10-CM | POA: Diagnosis not present

## 2021-11-04 MED ORDER — PROMETHAZINE-DM 6.25-15 MG/5ML PO SYRP: 5.0000 mL | ORAL_SOLUTION | Freq: Four times a day (QID) | ORAL | 1 refills | Status: DC | PRN

## 2021-11-04 NOTE — Progress Notes (Signed)
THIS ENCOUNTER IS A VIRTUAL VISIT DUE TO COVID-19 - PATIENT WAS NOT SEEN IN THE OFFICE.  PATIENT HAS CONSENTED TO VIRTUAL VISIT / TELEMEDICINE VISIT   Virtual Visit via telephone Note  I connected with  Nicole Padilla on 11/04/2021 by telephone.  I verified that I am speaking with the correct person using two identifiers.    I discussed the limitations of evaluation and management by telemedicine and the availability of in person appointments. The patient expressed understanding and agreed to proceed.  History of Present Illness:  There were no vitals taken for this visit. 66 y.o. patient with hx of morbid obesity and htn contacted office reporting URI sx. she tested positive by rapid screen at home yesterday. OV was conducted by telephone to minimize exposure. This patient was vaccinated for covid 19, last 09/2020  Sx began 3 days ago with running nose and cough, non-productive, clear discharge from nose; she denies chest congestion. Mild sense of warm/chills but hasn't checked temp. Mild arthralgia. Mild headache. Denies sinus tenderness. Denies wheezing, CP/back pain, dyspnea, fatigue. Denies dizziness or GI sx.   Treatments tried so far: rest, dayquil/nyquil, this seems to be helping, feels better than yesterday.   Exposures: none    Medications   Current Outpatient Medications (Cardiovascular):    olmesartan (BENICAR) 40 MG tablet, Take  1 tablet  Daily  for BP     Current Outpatient Medications (Other):    busPIRone (BUSPAR) 10 MG tablet, TAKE 1 TABLET BY MOUTH THREE TIMES DAILY AS NEEDED FOR ANXIETY   Calcium Carbonate-Vit D-Min (CALCIUM 1200 PO), Take by mouth daily.   DULoxetine (CYMBALTA) 30 MG capsule, Take  1 capsule  Daily  for Mood & Chronic Pain   Multiple Vitamins-Minerals (MULTIVITAMIN WITH MINERALS) tablet, Take 1 tablet by mouth daily.  Allergies:  Allergies  Allergen Reactions   E-Mycin [Erythromycin]     GI upset    Problem list She has Fibromyalgia;  H/O gastric bypass; Mixed hyperlipidemia; Adjustment disorder with mixed anxiety and depressed mood; Other abnormal glucose (prediabetes); Essential hypertension, benign; Medication management; Vitamin D deficiency; Morbid obesity ([place]); B12 deficiency; Iron deficiency; Rotator cuff impingement syndrome of left shoulder; and COVID-19 on their problem list.   Social History:   reports that she has never smoked. She has never used smokeless tobacco. She reports current alcohol use. She reports that she does not use drugs.  Observations/Objective:  General : Well sounding patient in no apparent distress HEENT: no hoarseness, no cough for duration of visit Lungs: speaks in complete sentences, no audible wheezing, no apparent distress Neurological: alert, oriented x 3 Psychiatric: pleasant, judgement appropriate   Assessment and Plan:  Covid 19 Covid 19 positive per rapid screening test at home Risk factors include: age 26+, morbid obesity, htn Symptoms are: mild Due to co morbid conditions and risk factors, discussed antivirals - declines at this time, she may contact back until Friday if not improving as expected Declined steroids Immue support/symptomatic management reviewed  Sudafed/irrigations/flonase for rhinitis Take tylenol PRN temp 101+ but otherwise mild temp is ok Push hydration Regular ambulation or calf exercises exercises for clot prevention and 81 mg ASA unless contraindicated Sx supportive therapy suggested Follow up via mychart or telephone if needed Advised patient obtain O2 monitor; present to ED if persistently <90% or with severe dyspnea, CP, fever uncontrolled by tylenol, confusion, sudden decline Should remain in isolation until at least 5 days from onset of sx, 24-48 hours fever free without tylenol, sx  such as cough are improved.  -     promethazine-dextromethorphan (PROMETHAZINE-DM) 6.25-15 MG/5ML syrup; Take 5 mLs by mouth 4 (four) times daily as needed for  cough.   Follow Up Instructions:  I discussed the assessment and treatment plan with the patient. The patient was provided an opportunity to ask questions and all were answered. The patient agreed with the plan and demonstrated an understanding of the instructions.   The patient was advised to call back or seek an in-person evaluation if the symptoms worsen or if the condition fails to improve as anticipated.  I provided 20 minutes of non-face-to-face time during this encounter.   Izora Ribas, NP

## 2021-12-01 ENCOUNTER — Ambulatory Visit: Payer: Medicare Other | Admitting: Adult Health Nurse Practitioner
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
## 2021-12-02 ENCOUNTER — Ambulatory Visit (INDEPENDENT_AMBULATORY_CARE_PROVIDER_SITE_OTHER): Payer: Medicare Other | Admitting: Nurse Practitioner

## 2021-12-02 ENCOUNTER — Encounter: Payer: Self-pay | Admitting: Nurse Practitioner

## 2021-12-02 ENCOUNTER — Ambulatory Visit: Payer: Medicare Other | Admitting: Adult Health

## 2021-12-02 ENCOUNTER — Other Ambulatory Visit: Payer: Self-pay

## 2021-12-02 ENCOUNTER — Other Ambulatory Visit: Payer: Self-pay | Admitting: Nurse Practitioner

## 2021-12-02 VITALS — BP 142/80 | HR 76 | Temp 97.9°F | Wt 261.2 lb

## 2021-12-02 DIAGNOSIS — Z9884 Bariatric surgery status: Secondary | ICD-10-CM | POA: Diagnosis not present

## 2021-12-02 DIAGNOSIS — E538 Deficiency of other specified B group vitamins: Secondary | ICD-10-CM | POA: Diagnosis not present

## 2021-12-02 DIAGNOSIS — J069 Acute upper respiratory infection, unspecified: Secondary | ICD-10-CM | POA: Diagnosis not present

## 2021-12-02 DIAGNOSIS — R7309 Other abnormal glucose: Secondary | ICD-10-CM | POA: Diagnosis not present

## 2021-12-02 DIAGNOSIS — Z79899 Other long term (current) drug therapy: Secondary | ICD-10-CM

## 2021-12-02 DIAGNOSIS — E611 Iron deficiency: Secondary | ICD-10-CM

## 2021-12-02 DIAGNOSIS — E2839 Other primary ovarian failure: Secondary | ICD-10-CM

## 2021-12-02 DIAGNOSIS — Z0001 Encounter for general adult medical examination with abnormal findings: Secondary | ICD-10-CM

## 2021-12-02 DIAGNOSIS — M797 Fibromyalgia: Secondary | ICD-10-CM

## 2021-12-02 DIAGNOSIS — Z0000 Encounter for general adult medical examination without abnormal findings: Secondary | ICD-10-CM

## 2021-12-02 DIAGNOSIS — I10 Essential (primary) hypertension: Secondary | ICD-10-CM

## 2021-12-02 DIAGNOSIS — R6889 Other general symptoms and signs: Secondary | ICD-10-CM | POA: Diagnosis not present

## 2021-12-02 DIAGNOSIS — E782 Mixed hyperlipidemia: Secondary | ICD-10-CM

## 2021-12-02 DIAGNOSIS — E559 Vitamin D deficiency, unspecified: Secondary | ICD-10-CM

## 2021-12-02 DIAGNOSIS — Z1231 Encounter for screening mammogram for malignant neoplasm of breast: Secondary | ICD-10-CM

## 2021-12-02 DIAGNOSIS — L989 Disorder of the skin and subcutaneous tissue, unspecified: Secondary | ICD-10-CM

## 2021-12-02 DIAGNOSIS — H6502 Acute serous otitis media, left ear: Secondary | ICD-10-CM

## 2021-12-02 DIAGNOSIS — F4323 Adjustment disorder with mixed anxiety and depressed mood: Secondary | ICD-10-CM

## 2021-12-02 MED ORDER — DEXAMETHASONE 1 MG PO TABS: ORAL_TABLET | ORAL | 0 refills | Status: DC

## 2021-12-02 MED ORDER — AZITHROMYCIN 250 MG PO TABS: ORAL_TABLET | ORAL | 1 refills | Status: DC

## 2021-12-02 MED ORDER — ALBUTEROL SULFATE HFA 108 (90 BASE) MCG/ACT IN AERS: 2.0000 | INHALATION_SPRAY | Freq: Four times a day (QID) | RESPIRATORY_TRACT | 2 refills | Status: DC | PRN
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
## 2021-12-03 LAB — CBC WITH DIFFERENTIAL/PLATELET
Absolute Monocytes: 440 cells/uL (ref 200–950)
Basophils Absolute: 43 cells/uL (ref 0–200)
Basophils Relative: 0.6 %
Eosinophils Absolute: 57 cells/uL (ref 15–500)
Eosinophils Relative: 0.8 %
HCT: 43.5 % (ref 35.0–45.0)
Hemoglobin: 14.1 g/dL (ref 11.7–15.5)
Lymphs Abs: 1917 cells/uL (ref 850–3900)
MCH: 28.4 pg (ref 27.0–33.0)
MCHC: 32.4 g/dL (ref 32.0–36.0)
MCV: 87.5 fL (ref 80.0–100.0)
MPV: 11.9 fL (ref 7.5–12.5)
Monocytes Relative: 6.2 %
Neutro Abs: 4643 cells/uL (ref 1500–7800)
Neutrophils Relative %: 65.4 %
Platelets: 256 10*3/uL (ref 140–400)
RBC: 4.97 10*6/uL (ref 3.80–5.10)
RDW: 13.2 % (ref 11.0–15.0)
Total Lymphocyte: 27 %
WBC: 7.1 10*3/uL (ref 3.8–10.8)

## 2021-12-03 LAB — IRON, TOTAL/TOTAL IRON BINDING CAP
%SAT: 33 % (calc) (ref 16–45)
Iron: 107 ug/dL (ref 45–160)
TIBC: 325 mcg/dL (calc) (ref 250–450)

## 2021-12-03 LAB — COMPLETE METABOLIC PANEL WITH GFR
AG Ratio: 1.5 (calc) (ref 1.0–2.5)
ALT: 17 U/L (ref 6–29)
AST: 14 U/L (ref 10–35)
Albumin: 4.1 g/dL (ref 3.6–5.1)
Alkaline phosphatase (APISO): 64 U/L (ref 37–153)
BUN: 13 mg/dL (ref 7–25)
CO2: 31 mmol/L (ref 20–32)
Calcium: 9.6 mg/dL (ref 8.6–10.4)
Chloride: 104 mmol/L (ref 98–110)
Creat: 0.79 mg/dL (ref 0.50–1.05)
Globulin: 2.7 g/dL (calc) (ref 1.9–3.7)
Glucose, Bld: 81 mg/dL (ref 65–99)
Potassium: 4.7 mmol/L (ref 3.5–5.3)
Sodium: 142 mmol/L (ref 135–146)
Total Bilirubin: 0.4 mg/dL (ref 0.2–1.2)
Total Protein: 6.8 g/dL (ref 6.1–8.1)
eGFR: 82 mL/min/{1.73_m2} (ref >=60)

## 2021-12-03 LAB — LIPID PANEL
Cholesterol: 209 mg/dL — ABNORMAL HIGH (ref <200)
HDL: 63 mg/dL (ref >=50)
LDL Cholesterol (Calc): 122 mg/dL (calc) — ABNORMAL HIGH
Non-HDL Cholesterol (Calc): 146 mg/dL (calc) — ABNORMAL HIGH (ref <130)
Total CHOL/HDL Ratio: 3.3 (calc) (ref <5.0)
Triglycerides: 126 mg/dL (ref <150)

## 2021-12-03 LAB — HEMOGLOBIN A1C
Hgb A1c MFr Bld: 5.9 % of total Hgb — ABNORMAL HIGH (ref <5.7)
Mean Plasma Glucose: 123 mg/dL
eAG (mmol/L): 6.8 mmol/L

## 2021-12-03 LAB — VITAMIN D 25 HYDROXY (VIT D DEFICIENCY, FRACTURES): Vit D, 25-Hydroxy: 51 ng/mL (ref 30–100)

## 2021-12-03 LAB — FERRITIN: Ferritin: 43 ng/mL (ref 16–288)

## 2021-12-03 LAB — VITAMIN B12: Vitamin B-12: 823 pg/mL (ref 200–1100)

## 2021-12-16 ENCOUNTER — Ambulatory Visit
Admission: RE | Admit: 2021-12-16 | Discharge: 2021-12-16 | Disposition: A | Discharge status: Home / Self Care | Payer: Medicare Other | Source: Ambulatory Visit | Attending: Nurse Practitioner | Admitting: Nurse Practitioner

## 2021-12-16 DIAGNOSIS — Z1231 Encounter for screening mammogram for malignant neoplasm of breast: Secondary | ICD-10-CM

## 2022-01-18 ENCOUNTER — Encounter: Payer: Self-pay | Admitting: Nurse Practitioner

## 2022-01-19 ENCOUNTER — Other Ambulatory Visit: Payer: Self-pay | Admitting: Nurse Practitioner

## 2022-01-19 DIAGNOSIS — F4323 Adjustment disorder with mixed anxiety and depressed mood: Secondary | ICD-10-CM

## 2022-01-19 MED ORDER — BUSPIRONE HCL 10 MG PO TABS: ORAL_TABLET | ORAL | 0 refills | Status: DC

## 2022-03-23 ENCOUNTER — Ambulatory Visit: Payer: Medicare Other | Admitting: Nurse Practitioner

## 2022-04-13 ENCOUNTER — Ambulatory Visit (INDEPENDENT_AMBULATORY_CARE_PROVIDER_SITE_OTHER): Payer: Medicare Other | Admitting: Nurse Practitioner

## 2022-04-13 ENCOUNTER — Encounter: Payer: Self-pay | Admitting: Nurse Practitioner

## 2022-04-13 DIAGNOSIS — E559 Vitamin D deficiency, unspecified: Secondary | ICD-10-CM | POA: Diagnosis not present

## 2022-04-13 DIAGNOSIS — I10 Essential (primary) hypertension: Secondary | ICD-10-CM | POA: Diagnosis not present

## 2022-04-13 DIAGNOSIS — F4323 Adjustment disorder with mixed anxiety and depressed mood: Secondary | ICD-10-CM

## 2022-04-13 DIAGNOSIS — M797 Fibromyalgia: Secondary | ICD-10-CM

## 2022-04-13 DIAGNOSIS — Z9884 Bariatric surgery status: Secondary | ICD-10-CM

## 2022-04-13 DIAGNOSIS — E782 Mixed hyperlipidemia: Secondary | ICD-10-CM

## 2022-04-13 DIAGNOSIS — L989 Disorder of the skin and subcutaneous tissue, unspecified: Secondary | ICD-10-CM

## 2022-04-13 DIAGNOSIS — Z79899 Other long term (current) drug therapy: Secondary | ICD-10-CM

## 2022-04-13 DIAGNOSIS — R7309 Other abnormal glucose: Secondary | ICD-10-CM

## 2022-04-14 LAB — CBC WITH DIFFERENTIAL/PLATELET
Absolute Monocytes: 376 cells/uL (ref 200–950)
Basophils Absolute: 43 cells/uL (ref 0–200)
Basophils Relative: 0.6 %
Eosinophils Absolute: 71 cells/uL (ref 15–500)
Eosinophils Relative: 1 %
HCT: 41.1 % (ref 35.0–45.0)
Hemoglobin: 13.8 g/dL (ref 11.7–15.5)
Lymphs Abs: 2137 cells/uL (ref 850–3900)
MCH: 28.8 pg (ref 27.0–33.0)
MCHC: 33.6 g/dL (ref 32.0–36.0)
MCV: 85.8 fL (ref 80.0–100.0)
MPV: 11.8 fL (ref 7.5–12.5)
Monocytes Relative: 5.3 %
Neutro Abs: 4473 cells/uL (ref 1500–7800)
Neutrophils Relative %: 63 %
Platelets: 251 10*3/uL (ref 140–400)
RBC: 4.79 10*6/uL (ref 3.80–5.10)
RDW: 13.1 % (ref 11.0–15.0)
Total Lymphocyte: 30.1 %
WBC: 7.1 10*3/uL (ref 3.8–10.8)

## 2022-04-14 LAB — COMPLETE METABOLIC PANEL WITH GFR
AG Ratio: 1.5 (calc) (ref 1.0–2.5)
ALT: 13 U/L (ref 6–29)
AST: 13 U/L (ref 10–35)
Albumin: 3.9 g/dL (ref 3.6–5.1)
Alkaline phosphatase (APISO): 69 U/L (ref 37–153)
BUN: 15 mg/dL (ref 7–25)
CO2: 26 mmol/L (ref 20–32)
Calcium: 9.5 mg/dL (ref 8.6–10.4)
Chloride: 107 mmol/L (ref 98–110)
Creat: 0.74 mg/dL (ref 0.50–1.05)
Globulin: 2.6 g/dL (calc) (ref 1.9–3.7)
Glucose, Bld: 82 mg/dL (ref 65–99)
Potassium: 4.8 mmol/L (ref 3.5–5.3)
Sodium: 142 mmol/L (ref 135–146)
Total Bilirubin: 0.3 mg/dL (ref 0.2–1.2)
Total Protein: 6.5 g/dL (ref 6.1–8.1)
eGFR: 89 mL/min/{1.73_m2} (ref >=60)

## 2022-04-14 LAB — LIPID PANEL
Cholesterol: 182 mg/dL (ref <200)
HDL: 55 mg/dL (ref >=50)
LDL Cholesterol (Calc): 107 mg/dL (calc) — ABNORMAL HIGH
Non-HDL Cholesterol (Calc): 127 mg/dL (calc) (ref <130)
Total CHOL/HDL Ratio: 3.3 (calc) (ref <5.0)
Triglycerides: 106 mg/dL (ref <150)

## 2022-04-14 LAB — TSH: TSH: 1.03 mIU/L (ref 0.40–4.50)

## 2022-04-19 NOTE — Progress Notes (Unsigned)
Assessment and Plan:  There are no diagnoses linked to this encounter.    Further disposition pending results of labs. Discussed med's effects and SE's.   Over 30 minutes of exam, counseling, chart review, and critical decision making was performed.   Future Appointments  Date Time Provider [place]  04/21/2022  4:00 PM Alycia Rossetti, NP [facility] None  04/29/2022 10:00 AM [facility] DX DEXA 1 [facility]  07/20/2022  3:00 PM Alycia Rossetti, NP [facility] None  12/07/2022 11:00 AM Alycia Rossetti, NP [facility] None    ------------------------------------------------------------------------------------------------------------------   HPI There were no vitals taken for this visit. 66 y.o.female presents for  Past Medical History:  Diagnosis Date   Abdominal wall abscess    Adjustment disorder with mixed anxiety and depressed mood 11/25/2013   Fibromyalgia    H/O gastric bypass    Mixed hyperlipidemia 11/25/2013     Allergies  Allergen Reactions   E-Mycin [Erythromycin]     GI upset    Current Outpatient Medications on File Prior to Visit  Medication Sig   busPIRone (BUSPAR) 10 MG tablet TAKE 1 TABLET BY MOUTH THREE TIMES DAILY AS NEEDED FOR ANXIETY   Calcium Carbonate-Vit D-Min (CALCIUM 1200 PO) Take by mouth daily.   Multiple Vitamins-Minerals (MULTIVITAMIN WITH MINERALS) tablet Take 1 tablet by mouth daily.   olmesartan (BENICAR) 40 MG tablet Take  1 tablet  Daily  for BP   No current facility-administered medications on file prior to visit.    ROS: all negative except above.   Physical Exam:  There were no vitals taken for this visit.  General Appearance: Well nourished, in no apparent distress. Eyes: PERRLA, EOMs, conjunctiva no swelling or erythema Sinuses: No Frontal/maxillary tenderness ENT/Mouth: Ext aud canals clear, TMs without erythema, bulging. No erythema, swelling, or exudate on post pharynx.  Tonsils not swollen or erythematous.  Hearing normal.  Neck: Supple, thyroid normal.  Respiratory: Respiratory effort normal, BS equal bilaterally without rales, rhonchi, wheezing or stridor.  Cardio: RRR with no MRGs. Brisk peripheral pulses without edema.  Abdomen: Soft, + BS.  Non tender, no guarding, rebound, hernias, masses. Lymphatics: Non tender without lymphadenopathy.  Musculoskeletal: Full ROM, 5/5 strength, normal gait.  Skin: Warm, dry without rashes, lesions, ecchymosis.  Neuro: Cranial nerves intact. Normal muscle tone, no cerebellar symptoms. Sensation intact.  Psych: Awake and oriented X 3, normal affect, Insight and Judgment appropriate.     Alycia Rossetti, NP 9:46 AM [facility]

## 2022-04-21 ENCOUNTER — Ambulatory Visit (INDEPENDENT_AMBULATORY_CARE_PROVIDER_SITE_OTHER): Payer: Medicare Other | Admitting: Nurse Practitioner

## 2022-04-21 ENCOUNTER — Encounter: Payer: Self-pay | Admitting: Nurse Practitioner

## 2022-04-21 VITALS — BP 144/80 | HR 69 | Temp 96.8°F | Wt 258.0 lb

## 2022-04-21 DIAGNOSIS — D485 Neoplasm of uncertain behavior of skin: Secondary | ICD-10-CM | POA: Diagnosis not present

## 2022-04-21 DIAGNOSIS — M25562 Pain in left knee: Secondary | ICD-10-CM

## 2022-04-21 DIAGNOSIS — L989 Disorder of the skin and subcutaneous tissue, unspecified: Secondary | ICD-10-CM | POA: Diagnosis not present

## 2022-04-21 DIAGNOSIS — I10 Essential (primary) hypertension: Secondary | ICD-10-CM | POA: Diagnosis not present

## 2022-04-29 ENCOUNTER — Other Ambulatory Visit: Payer: Medicare Other

## 2022-05-06 ENCOUNTER — Ambulatory Visit
Admission: RE | Admit: 2022-05-06 | Discharge: 2022-05-06 | Disposition: A | Discharge status: Home / Self Care | Payer: Medicare Other | Source: Ambulatory Visit | Attending: Nurse Practitioner | Admitting: Nurse Practitioner

## 2022-05-06 DIAGNOSIS — M25562 Pain in left knee: Secondary | ICD-10-CM | POA: Diagnosis not present

## 2022-05-07 ENCOUNTER — Encounter: Payer: Self-pay | Admitting: Nurse Practitioner

## 2022-05-24 ENCOUNTER — Other Ambulatory Visit: Payer: Self-pay | Admitting: Nurse Practitioner

## 2022-05-24 ENCOUNTER — Other Ambulatory Visit: Payer: Self-pay | Admitting: Internal Medicine

## 2022-05-24 DIAGNOSIS — F4323 Adjustment disorder with mixed anxiety and depressed mood: Secondary | ICD-10-CM

## 2022-06-08 ENCOUNTER — Encounter: Payer: Medicare Other | Admitting: Nurse Practitioner
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
## 2022-07-20 ENCOUNTER — Encounter: Payer: Medicare Other | Admitting: Nurse Practitioner

## 2022-07-26 ENCOUNTER — Ambulatory Visit
Admission: RE | Admit: 2022-07-26 | Discharge: 2022-07-26 | Disposition: A | Discharge status: Home / Self Care | Payer: Medicare Other | Source: Ambulatory Visit | Attending: Nurse Practitioner | Admitting: Nurse Practitioner

## 2022-07-26 DIAGNOSIS — M85851 Other specified disorders of bone density and structure, right thigh: Secondary | ICD-10-CM | POA: Diagnosis not present

## 2022-07-26 DIAGNOSIS — Z780 Asymptomatic menopausal state: Secondary | ICD-10-CM | POA: Diagnosis not present

## 2022-07-26 DIAGNOSIS — E2839 Other primary ovarian failure: Secondary | ICD-10-CM

## 2022-08-06 ENCOUNTER — Telehealth: Payer: Self-pay | Admitting: Orthopedic Surgery

## 2022-08-06 ENCOUNTER — Ambulatory Visit: Payer: Medicare Other | Admitting: Orthopedic Surgery

## 2022-08-06 DIAGNOSIS — M1712 Unilateral primary osteoarthritis, left knee: Secondary | ICD-10-CM

## 2022-08-06 DIAGNOSIS — M170 Bilateral primary osteoarthritis of knee: Secondary | ICD-10-CM | POA: Diagnosis not present

## 2022-08-06 NOTE — Telephone Encounter (Signed)
Patient states that Dr Marlou Sa was suppose to send her meds to [facility] for   [employer]

## 2022-08-08 ENCOUNTER — Encounter: Payer: Self-pay | Admitting: Orthopedic Surgery

## 2022-08-08 MED ORDER — BUPIVACAINE HCL 0.25 % IJ SOLN
4.0000 mL | INTRAMUSCULAR | Status: AC | PRN
  Administered 2022-08-06: 4 mL via INTRA_ARTICULAR

## 2022-08-08 MED ORDER — LIDOCAINE HCL 1 % IJ SOLN
5.0000 mL | INTRAMUSCULAR | Status: AC | PRN
  Administered 2022-08-06: 5 mL

## 2022-08-08 MED ORDER — MELOXICAM 15 MG PO TABS: 15.0000 mg | ORAL_TABLET | Freq: Every day | ORAL | 1 refills | Status: DC

## 2022-08-08 MED ORDER — METHYLPREDNISOLONE ACETATE 40 MG/ML IJ SUSP
40.0000 mg | INTRAMUSCULAR | Status: AC | PRN
  Administered 2022-08-06: 40 mg via INTRA_ARTICULAR

## 2022-08-08 NOTE — Progress Notes (Signed)
Office Visit Note   Patient: Nicole Padilla           Date of Birth: [birth year]           MRN: [id number] Visit Date: 08/06/2022 Requested by: Unk Pinto, [address] [place],  [place] [address] PCP: Unk Pinto, MD  Subjective: Chief Complaint  Patient presents with   Left Knee - Pain    HPI: Nicole Padilla is a 66 y.o. female who presents to the office reporting .  Left knee pain of 6 to 7 months in duration.  Had pain earlier this year but then she fell in May and the pain has been getting worse since that time.  The pain does wake her from sleep at night.  Reports no swelling but does describe stiffness as well as weakness and giving way.  She has to guard with twisting motions.  Occasionally she will have to walk with her legs stiff in order to protect herself.  She has a known diagnosis of osteopenia.  Has a history of shoulder surgery as well.  Has tried Aleve tramadol and Tylenol with no help.  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  She wants to be active.  Her main concern is that she does not want to have to be sedentary.  She does have social support at home.  No personal or family history of DVT or pulmonary embolism.  Radiographs done several months ago in [place] [facility] do show predominantly medial sided moderate to severe arthritis in the knee.              ROS: All systems reviewed are negative as they relate to the chief complaint within the history of present illness.  Patient denies fevers or chills.  Assessment & Plan: Visit Diagnoses: No diagnosis found.  Plan: Impression is left-sided knee arthritis which is symptomatic.  Plan is nonweightbearing quad strengthening exercises.  Preapproved gel injection.  70-monthreturn.  Injection in the knee performed today.  We will also try Mobic as an anti-inflammatory.  She might be  heading for knee replacement but I do not think it will be anytime within the next 6 months.  She does have a busy season of work in the upcoming months.  Follow-up in 2 months for clinical recheck. This patient is diagnosed with osteoarthritis of the knee(s).    Radiographs show evidence of joint space narrowing, osteophytes, subchondral sclerosis and/or subchondral cysts.  This patient has knee pain which interferes with functional and activities of daily living.    This patient has experienced inadequate response, adverse effects and/or intolerance with conservative treatments such as acetaminophen, NSAIDS, topical creams, physical therapy or regular exercise, knee bracing and/or weight loss.   This patient has experienced inadequate response or has a contraindication to intra articular steroid injections for at least 3 months.   This patient is not scheduled to have a total knee replacement within 6 months of starting treatment with viscosupplementation.   Follow-Up Instructions: No follow-ups on file.   Orders:  No orders of the defined types were placed in this encounter.  No orders of the defined types were placed in this encounter.     Procedures: Large Joint Inj: L knee on 08/06/2022 7:09 AM Indications: diagnostic evaluation, joint swelling and pain Details: 18 G 1.5 in needle, superolateral approach  Arthrogram: No  Medications: 5 mL lidocaine 1 %; 40 mg methylPREDNISolone acetate 40 MG/ML; 4 mL bupivacaine 0.25 % Outcome: tolerated well, no immediate complications Procedure, treatment alternatives, risks and benefits explained, specific risks discussed. Consent was given by the patient. Immediately prior to procedure a time out was called to verify the correct patient, procedure, equipment, support staff and site/side marked as required. Patient was prepped and draped in the usual sterile fashion.       Clinical Data: No additional findings.  Objective: Vital  Signs: There were no vitals taken for this visit.  Physical Exam:  Constitutional: Patient appears well-developed HEENT:  Head: Normocephalic Eyes:EOM are normal Neck: Normal range of motion Cardiovascular: Normal rate Pulmonary/chest: Effort normal Neurologic: Patient is alert Skin: Skin is warm Psychiatric: Patient has normal mood and affect  Ortho Exam: Ortho exam demonstrates slightly antalgic gait to the left.  Extensor mechanism intact bilaterally.  Collateral crucial ligaments stable.  Pedal pulses palpable.  Patient does have a varicose vein over the left knee.  No groin pain with internal/external rotation of either side.  Range of motion is 0-1 15 on the left and right-hand side.  No masses lymphadenopathy or skin changes noted in that knee region.  Specialty Comments:  No specialty comments available.  Imaging: No results found.   PMFS History: Patient Active Problem List   Diagnosis Date Noted   COVID-19 11/04/2021   Rotator cuff impingement syndrome of left shoulder 06/18/2021   B12 deficiency 06/03/2021   Iron deficiency 06/03/2021   Morbid obesity ([place]) 07/17/2018   Other abnormal glucose (prediabetes) 12/31/2014   Essential hypertension, benign 12/31/2014   Medication management 12/31/2014   Vitamin D deficiency 12/31/2014   Mixed hyperlipidemia 11/25/2013   Adjustment disorder with mixed anxiety and depressed mood 11/25/2013   Fibromyalgia    H/O gastric bypass    Past Medical History:  Diagnosis Date   Abdominal wall abscess    Adjustment disorder with mixed anxiety and depressed mood 11/25/2013   Fibromyalgia    H/O gastric bypass    Mixed hyperlipidemia 11/25/2013    Family History  Problem Relation Age of Onset   Osteoporosis Mother    Emphysema Mother    Alzheimer's disease Father    Heart disease Father    Multiple sclerosis Sister     Past Surgical History:  Procedure Laterality Date   APPENDECTOMY     EXPLORATORY LAPAROTOMY  2006   post  bypass, had intestinal preforation   GASTRIC BYPASS  03/02/2005   intestinal perforation     salivary stones removed     TONSILLECTOMY     WISDOM TOOTH EXTRACTION     Social History   Occupational History   Not on file  Tobacco Use   Smoking status: Never   Smokeless tobacco: Never  Vaping Use   Vaping Use: Never used  Substance and Sexual Activity   Alcohol use: Yes    Comment: rarely   Drug use: Never   Sexual activity: Not Currently    Partners: Female    Birth control/protection: Post-menopausal

## 2022-08-10 NOTE — Progress Notes (Signed)
VOB submitted for Durolane, left knee.  

## 2022-08-15 NOTE — Telephone Encounter (Signed)
This was sent in 10/22

## 2022-09-07 ENCOUNTER — Other Ambulatory Visit: Payer: Self-pay | Admitting: Nurse Practitioner

## 2022-09-07 DIAGNOSIS — F4323 Adjustment disorder with mixed anxiety and depressed mood: Secondary | ICD-10-CM

## 2022-09-08 MED ORDER — BUSPIRONE HCL 10 MG PO TABS: 10.0000 mg | ORAL_TABLET | Freq: Three times a day (TID) | ORAL | 0 refills | Status: DC | PRN

## 2022-09-13 ENCOUNTER — Other Ambulatory Visit: Payer: Self-pay | Admitting: Nurse Practitioner

## 2022-09-13 DIAGNOSIS — I10 Essential (primary) hypertension: Secondary | ICD-10-CM

## 2022-09-13 MED ORDER — OLMESARTAN MEDOXOMIL 20 MG PO TABS: 20.0000 mg | ORAL_TABLET | Freq: Every day | ORAL | 3 refills | Status: DC

## 2022-09-13 NOTE — Progress Notes (Signed)
Patient advised pharmacist she has only been taking 1/2 of Olmesartan 40 mg daily.  Needs a new prescription for Olmesartan 20 mg as BP has been controlled on half dose

## 2022-09-16 DIAGNOSIS — J0110 Acute frontal sinusitis, unspecified: Secondary | ICD-10-CM | POA: Diagnosis not present
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
## 2022-09-22 ENCOUNTER — Encounter: Payer: Medicare Other | Admitting: Nurse Practitioner

## 2022-09-22 DIAGNOSIS — Z0001 Encounter for general adult medical examination with abnormal findings: Secondary | ICD-10-CM

## 2022-09-22 DIAGNOSIS — R7309 Other abnormal glucose: Secondary | ICD-10-CM

## 2022-09-22 DIAGNOSIS — Z1389 Encounter for screening for other disorder: Secondary | ICD-10-CM

## 2022-09-22 DIAGNOSIS — E559 Vitamin D deficiency, unspecified: Secondary | ICD-10-CM

## 2022-09-22 DIAGNOSIS — Z136 Encounter for screening for cardiovascular disorders: Secondary | ICD-10-CM

## 2022-09-22 DIAGNOSIS — I10 Essential (primary) hypertension: Secondary | ICD-10-CM

## 2022-09-22 DIAGNOSIS — E538 Deficiency of other specified B group vitamins: Secondary | ICD-10-CM

## 2022-09-22 DIAGNOSIS — Z9884 Bariatric surgery status: Secondary | ICD-10-CM

## 2022-09-22 DIAGNOSIS — E782 Mixed hyperlipidemia: Secondary | ICD-10-CM

## 2022-09-22 DIAGNOSIS — Z1329 Encounter for screening for other suspected endocrine disorder: Secondary | ICD-10-CM

## 2022-09-22 DIAGNOSIS — F4323 Adjustment disorder with mixed anxiety and depressed mood: Secondary | ICD-10-CM

## 2022-09-22 DIAGNOSIS — Z79899 Other long term (current) drug therapy: Secondary | ICD-10-CM

## 2022-09-22 DIAGNOSIS — M797 Fibromyalgia: Secondary | ICD-10-CM

## 2022-09-23 ENCOUNTER — Other Ambulatory Visit: Payer: Self-pay

## 2022-09-23 DIAGNOSIS — M170 Bilateral primary osteoarthritis of knee: Secondary | ICD-10-CM
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
## 2022-10-01 ENCOUNTER — Ambulatory Visit (INDEPENDENT_AMBULATORY_CARE_PROVIDER_SITE_OTHER): Payer: Medicare Other | Admitting: Nurse Practitioner

## 2022-10-01 ENCOUNTER — Encounter: Payer: Self-pay | Admitting: Nurse Practitioner

## 2022-10-01 VITALS — BP 132/62 | HR 61 | Temp 97.7°F | Ht 62.0 in | Wt 241.2 lb

## 2022-10-01 DIAGNOSIS — M797 Fibromyalgia: Secondary | ICD-10-CM

## 2022-10-01 DIAGNOSIS — E559 Vitamin D deficiency, unspecified: Secondary | ICD-10-CM | POA: Diagnosis not present

## 2022-10-01 DIAGNOSIS — Z1389 Encounter for screening for other disorder: Secondary | ICD-10-CM

## 2022-10-01 DIAGNOSIS — E538 Deficiency of other specified B group vitamins: Secondary | ICD-10-CM

## 2022-10-01 DIAGNOSIS — F4323 Adjustment disorder with mixed anxiety and depressed mood: Secondary | ICD-10-CM

## 2022-10-01 DIAGNOSIS — R7309 Other abnormal glucose: Secondary | ICD-10-CM | POA: Diagnosis not present

## 2022-10-01 DIAGNOSIS — Z1329 Encounter for screening for other suspected endocrine disorder: Secondary | ICD-10-CM

## 2022-10-01 DIAGNOSIS — Z0000 Encounter for general adult medical examination without abnormal findings: Secondary | ICD-10-CM

## 2022-10-01 DIAGNOSIS — L819 Disorder of pigmentation, unspecified: Secondary | ICD-10-CM

## 2022-10-01 DIAGNOSIS — Z79899 Other long term (current) drug therapy: Secondary | ICD-10-CM | POA: Diagnosis not present

## 2022-10-01 DIAGNOSIS — I700 Atherosclerosis of aorta: Secondary | ICD-10-CM

## 2022-10-01 DIAGNOSIS — I10 Essential (primary) hypertension: Secondary | ICD-10-CM | POA: Diagnosis not present

## 2022-10-01 DIAGNOSIS — E611 Iron deficiency: Secondary | ICD-10-CM | POA: Diagnosis not present

## 2022-10-01 DIAGNOSIS — Z136 Encounter for screening for cardiovascular disorders: Secondary | ICD-10-CM | POA: Diagnosis not present

## 2022-10-01 DIAGNOSIS — J069 Acute upper respiratory infection, unspecified: Secondary | ICD-10-CM

## 2022-10-01 DIAGNOSIS — L989 Disorder of the skin and subcutaneous tissue, unspecified: Secondary | ICD-10-CM

## 2022-10-01 DIAGNOSIS — Z0001 Encounter for general adult medical examination with abnormal findings: Secondary | ICD-10-CM

## 2022-10-01 DIAGNOSIS — Z9884 Bariatric surgery status: Secondary | ICD-10-CM

## 2022-10-01 DIAGNOSIS — E782 Mixed hyperlipidemia: Secondary | ICD-10-CM

## 2022-10-01 MED ORDER — DEXAMETHASONE 2 MG PO TABS: ORAL_TABLET | ORAL | 0 refills | Status: DC

## 2022-10-01 MED ORDER — AZITHROMYCIN 250 MG PO TABS: ORAL_TABLET | ORAL | 1 refills | Status: DC

## 2022-10-01 MED ORDER — PROMETHAZINE-DM 6.25-15 MG/5ML PO SYRP: 5.0000 mL | ORAL_SOLUTION | Freq: Four times a day (QID) | ORAL | 1 refills | Status: DC | PRN

## 2022-10-01 MED ORDER — MONTELUKAST SODIUM 10 MG PO TABS: 10.0000 mg | ORAL_TABLET | Freq: Every day | ORAL | 2 refills | Status: DC

## 2022-10-01 NOTE — Patient Instructions (Signed)

## 2022-10-02 LAB — URINALYSIS W MICROSCOPIC + REFLEX CULTURE
Bacteria, UA: NONE SEEN /HPF
Bilirubin Urine: NEGATIVE
Glucose, UA: NEGATIVE
Hgb urine dipstick: NEGATIVE
Hyaline Cast: NONE SEEN /LPF
Ketones, ur: NEGATIVE
Leukocyte Esterase: NEGATIVE
Nitrites, Initial: NEGATIVE
Protein, ur: NEGATIVE
RBC / HPF: NONE SEEN /HPF (ref 0–2)
Specific Gravity, Urine: 1.016 (ref 1.001–1.035)
Squamous Epithelial / HPF: NONE SEEN /HPF (ref <=5)
WBC, UA: NONE SEEN /HPF (ref 0–5)
pH: 5.5 (ref 5.0–8.0)

## 2022-10-02 LAB — CBC WITH DIFFERENTIAL/PLATELET
Absolute Monocytes: 280 cells/uL (ref 200–950)
Basophils Absolute: 33 cells/uL (ref 0–200)
Basophils Relative: 0.5 %
Eosinophils Absolute: 39 cells/uL (ref 15–500)
Eosinophils Relative: 0.6 %
HCT: 41.7 % (ref 35.0–45.0)
Hemoglobin: 13.5 g/dL (ref 11.7–15.5)
Lymphs Abs: 1515 cells/uL (ref 850–3900)
MCH: 27.9 pg (ref 27.0–33.0)
MCHC: 32.4 g/dL (ref 32.0–36.0)
MCV: 86.2 fL (ref 80.0–100.0)
MPV: 11.9 fL (ref 7.5–12.5)
Monocytes Relative: 4.3 %
Neutro Abs: 4635 cells/uL (ref 1500–7800)
Neutrophils Relative %: 71.3 %
Platelets: 305 10*3/uL (ref 140–400)
RBC: 4.84 10*6/uL (ref 3.80–5.10)
RDW: 12.5 % (ref 11.0–15.0)
Total Lymphocyte: 23.3 %
WBC: 6.5 10*3/uL (ref 3.8–10.8)

## 2022-10-02 LAB — COMPLETE METABOLIC PANEL WITH GFR
AG Ratio: 1.5 (calc) (ref 1.0–2.5)
ALT: 14 U/L (ref 6–29)
AST: 12 U/L (ref 10–35)
Albumin: 4.1 g/dL (ref 3.6–5.1)
Alkaline phosphatase (APISO): 67 U/L (ref 37–153)
BUN: 14 mg/dL (ref 7–25)
CO2: 28 mmol/L (ref 20–32)
Calcium: 9.4 mg/dL (ref 8.6–10.4)
Chloride: 105 mmol/L (ref 98–110)
Creat: 0.7 mg/dL (ref 0.50–1.05)
Globulin: 2.8 g/dL (calc) (ref 1.9–3.7)
Glucose, Bld: 85 mg/dL (ref 65–99)
Potassium: 4.5 mmol/L (ref 3.5–5.3)
Sodium: 139 mmol/L (ref 135–146)
Total Bilirubin: 0.3 mg/dL (ref 0.2–1.2)
Total Protein: 6.9 g/dL (ref 6.1–8.1)
eGFR: 95 mL/min/{1.73_m2} (ref >=60)

## 2022-10-02 LAB — IRON, TOTAL/TOTAL IRON BINDING CAP
%SAT: 11 % (calc) — ABNORMAL LOW (ref 16–45)
Iron: 38 ug/dL — ABNORMAL LOW (ref 45–160)
TIBC: 358 mcg/dL (calc) (ref 250–450)

## 2022-10-02 LAB — LIPID PANEL
Cholesterol: 206 mg/dL — ABNORMAL HIGH (ref <200)
HDL: 64 mg/dL (ref >=50)
LDL Cholesterol (Calc): 123 mg/dL (calc) — ABNORMAL HIGH
Non-HDL Cholesterol (Calc): 142 mg/dL (calc) — ABNORMAL HIGH (ref <130)
Total CHOL/HDL Ratio: 3.2 (calc) (ref <5.0)
Triglycerides: 91 mg/dL (ref <150)

## 2022-10-02 LAB — MICROALBUMIN / CREATININE URINE RATIO
Creatinine, Urine: 85 mg/dL (ref 20–275)
Microalb Creat Ratio: 2 mcg/mg creat (ref <30)
Microalb, Ur: 0.2 mg/dL

## 2022-10-02 LAB — HEMOGLOBIN A1C
Hgb A1c MFr Bld: 5.9 % of total Hgb — ABNORMAL HIGH (ref <5.7)
Mean Plasma Glucose: 123 mg/dL
eAG (mmol/L): 6.8 mmol/L

## 2022-10-02 LAB — VITAMIN D 25 HYDROXY (VIT D DEFICIENCY, FRACTURES): Vit D, 25-Hydroxy: 40 ng/mL (ref 30–100)

## 2022-10-02 LAB — NO CULTURE INDICATED

## 2022-10-02 LAB — MAGNESIUM: Magnesium: 2 mg/dL (ref 1.5–2.5)

## 2022-10-02 LAB — FERRITIN: Ferritin: 22 ng/mL (ref 16–288)

## 2022-10-02 LAB — TSH: TSH: 1.23 mIU/L (ref 0.40–4.50)

## 2022-10-06 ENCOUNTER — Ambulatory Visit: Payer: Medicare Other | Admitting: Orthopedic Surgery

## 2022-10-06 DIAGNOSIS — M1712 Unilateral primary osteoarthritis, left knee: Secondary | ICD-10-CM

## 2022-10-06 DIAGNOSIS — M170 Bilateral primary osteoarthritis of knee: Secondary | ICD-10-CM

## 2022-10-07 ENCOUNTER — Encounter: Payer: Self-pay | Admitting: Orthopedic Surgery

## 2022-10-07 DIAGNOSIS — H25813 Combined forms of age-related cataract, bilateral: Secondary | ICD-10-CM | POA: Diagnosis not present

## 2022-10-07 DIAGNOSIS — H532 Diplopia: Secondary | ICD-10-CM | POA: Diagnosis not present

## 2022-10-07 DIAGNOSIS — H524 Presbyopia: Secondary | ICD-10-CM | POA: Diagnosis not present

## 2022-10-07 DIAGNOSIS — H25811 Combined forms of age-related cataract, right eye: Secondary | ICD-10-CM | POA: Diagnosis not present

## 2022-10-07 NOTE — Progress Notes (Signed)
   Procedure Note  Patient: Nicole Padilla             Date of Birth: 03-10-1956           MRN: [id number]             Visit Date: 10/06/2022  Procedures: Visit Diagnoses:  1. Primary osteoarthritis of both knees     Large Joint Inj: L knee on 10/06/2022 8:49 AM Indications: diagnostic evaluation, joint swelling and pain Details: 18 G 1.5 in needle, superolateral approach  Arthrogram: No  Medications: 5 mL lidocaine 1 %; 60 mg Sodium Hyaluronate 60 MG/3ML Outcome: tolerated well, no immediate complications Procedure, treatment alternatives, risks and benefits explained, specific risks discussed. Consent was given by the patient. Immediately prior to procedure a time out was called to verify the correct patient, procedure, equipment, support staff and site/side marked as required. Patient was prepped and draped in the usual sterile fashion.

## 2022-10-12 MED ORDER — SODIUM HYALURONATE 60 MG/3ML IX PRSY
60.0000 mg | PREFILLED_SYRINGE | INTRA_ARTICULAR | Status: AC | PRN
  Administered 2022-10-06: 60 mg via INTRA_ARTICULAR

## 2022-10-12 MED ORDER — LIDOCAINE HCL 1 % IJ SOLN
5.0000 mL | INTRAMUSCULAR | Status: AC | PRN
  Administered 2022-10-06: 5 mL

## 2022-11-02 ENCOUNTER — Other Ambulatory Visit: Payer: Self-pay | Admitting: Orthopedic Surgery

## 2022-11-24 DIAGNOSIS — H268 Other specified cataract: Secondary | ICD-10-CM | POA: Diagnosis not present

## 2022-11-24 DIAGNOSIS — H25811 Combined forms of age-related cataract, right eye: Secondary | ICD-10-CM | POA: Diagnosis not present

## 2022-12-07 ENCOUNTER — Ambulatory Visit: Payer: Medicare Other | Admitting: Nurse Practitioner

## 2022-12-16 ENCOUNTER — Other Ambulatory Visit: Payer: Self-pay | Admitting: Nurse Practitioner

## 2022-12-16 DIAGNOSIS — F4323 Adjustment disorder with mixed anxiety and depressed mood: Secondary | ICD-10-CM

## 2022-12-30 DIAGNOSIS — H25812 Combined forms of age-related cataract, left eye: Secondary | ICD-10-CM | POA: Diagnosis not present

## 2023-01-05 DIAGNOSIS — H268 Other specified cataract: Secondary | ICD-10-CM | POA: Diagnosis not present

## 2023-01-05 DIAGNOSIS — H25812 Combined forms of age-related cataract, left eye: Secondary | ICD-10-CM | POA: Diagnosis not present

## 2023-01-12 ENCOUNTER — Other Ambulatory Visit (INDEPENDENT_AMBULATORY_CARE_PROVIDER_SITE_OTHER): Payer: Medicare Other

## 2023-01-12 ENCOUNTER — Encounter: Payer: Self-pay | Admitting: Orthopedic Surgery

## 2023-01-12 ENCOUNTER — Ambulatory Visit: Payer: Medicare Other | Admitting: Orthopedic Surgery

## 2023-01-12 DIAGNOSIS — M1712 Unilateral primary osteoarthritis, left knee: Secondary | ICD-10-CM | POA: Diagnosis not present

## 2023-01-12 DIAGNOSIS — M79671 Pain in right foot: Secondary | ICD-10-CM

## 2023-01-12 MED ORDER — BUPIVACAINE HCL 0.25 % IJ SOLN
4.0000 mL | INTRAMUSCULAR | Status: AC | PRN
  Administered 2023-01-12: 4 mL via INTRA_ARTICULAR

## 2023-01-12 MED ORDER — METHYLPREDNISOLONE ACETATE 40 MG/ML IJ SUSP
40.0000 mg | INTRAMUSCULAR | Status: AC | PRN
  Administered 2023-01-12: 40 mg via INTRA_ARTICULAR

## 2023-01-12 MED ORDER — LIDOCAINE HCL 1 % IJ SOLN
5.0000 mL | INTRAMUSCULAR | Status: AC | PRN
  Administered 2023-01-12: 5 mL

## 2023-01-12 NOTE — Progress Notes (Signed)
Office Visit Note   Patient: Nicole Padilla           Date of Birth: [birth year]           MRN: [id number] Visit Date: 01/12/2023 Requested by: Unk Pinto, [address] [place],  [place] [address] PCP: Unk Pinto, MD  Subjective: Chief Complaint  Patient presents with   Left Knee - Pain    HPI: Nicole Padilla is a 67 y.o. female who presents to the office reporting left knee pain.  Patient has history of left knee osteoarthritis.  Primarily confined to the medial compartment.  She has had previous cortisone injection on 08/06/2022 that provided excellent relief for her symptoms as opposed to the gel injection on 10/06/2022 that provided no relief for her symptoms.  She has had 1 prior cortisone injection and would like to repeat this today.  She has no groin pain but does have some lateral hip pain.  No radicular pain.  She also complains of bilateral foot pain that she localizes to the dorsum of the midfoot in either foot.  Right foot bothers her significant more than the left.  She has difficulty wearing shoes due to the strap bothering the top of the foot.  No pain elsewhere in the foot.  She has no history of injury to the foot.  This been going on for several years..                ROS: All systems reviewed are negative as they relate to the chief complaint within the history of present illness.  Patient denies fevers or chills.  Assessment & Plan: Visit Diagnoses:  1. Unilateral primary osteoarthritis, left knee   2. Pain in right foot     Plan: Patient is a 67 year old female who presents for evaluation of left knee pain.  She has history of left knee osteoarthritis.  Previous cortisone injection has given her great relief.  She would like to repeat this today.  Gel injection did not provide any lasting relief for any amount of time.  Cortisone injection successfully administered and patient tolerated procedure well.  She had substantial relief of  symptoms about 5 minutes after injection.  She also complains of right foot pain.  She has osteophyte formation in the dorsum of the midfoot that is noted on radiographs and she has tenderness that correlates with this area on exam.  She will try Voltaren gel over this on the foot which she has not tried yet.  She has tried this for her hand pain which helps.  Will see how this does and she will return for 87-month repeat cortisone injection.  If she would like to try right foot injection under ultrasound, we can consider this in the future.  Follow-Up Instructions: No follow-ups on file.   Orders:  Orders Placed This Encounter  Procedures   XR Foot Complete Right   No orders of the defined types were placed in this encounter.     Procedures: Large Joint Inj: L knee on 01/12/2023 12:11 PM Indications: diagnostic evaluation, joint swelling and pain Details: 18 G 1.5 in needle, superolateral approach  Arthrogram: No  Medications: 5 mL lidocaine 1 %; 40 mg methylPREDNISolone acetate 40 MG/ML; 4 mL bupivacaine 0.25 % Outcome: tolerated well, no immediate complications Procedure, treatment alternatives, risks and benefits explained, specific risks discussed. Consent was given by the patient. Immediately prior to procedure a time out was called to verify the correct patient,  procedure, equipment, support staff and site/side marked as required. Patient was prepped and draped in the usual sterile fashion.       Clinical Data: No additional findings.  Objective: Vital Signs: There were no vitals taken for this visit.  Physical Exam:  Constitutional: Patient appears well-developed HEENT:  Head: Normocephalic Eyes:EOM are normal Neck: Normal range of motion Cardiovascular: Normal rate Pulmonary/chest: Effort normal Neurologic: Patient is alert Skin: Skin is warm Psychiatric: Patient has normal mood and affect  Ortho Exam: Ortho exam demonstrates left knee without effusion.  She has  0 degrees extension and 120 degrees of knee flexion.  She has tenderness moderately over the medial compartment with no lateral joint line tenderness.  She has no calf tenderness.  Negative Homans' sign.  No pain with hip range of motion.  Negative FADIR sign.  Able to perform straight leg raise without extensor lag.  She has intact ankle dorsiflexion and plantarflexion of both feet.  Palpable DP pulse of the right foot.  She has intact sensation throughout the foot.  Tenderness primarily over the dorsal midfoot.  No cellulitis or skin changes noted.  There is a bony prominence that is palpable in the dorsal midfoot.  Specialty Comments:  No specialty comments available.  Imaging: No results found.   PMFS History: Patient Active Problem List   Diagnosis Date Noted   COVID-19 11/04/2021   Rotator cuff impingement syndrome of left shoulder 06/18/2021   B12 deficiency 06/03/2021   Iron deficiency 06/03/2021   Morbid obesity ([place]) 07/17/2018   Other abnormal glucose (prediabetes) 12/31/2014   Essential hypertension, benign 12/31/2014   Medication management 12/31/2014   Vitamin D deficiency 12/31/2014   Mixed hyperlipidemia 11/25/2013   Adjustment disorder with mixed anxiety and depressed mood 11/25/2013   Fibromyalgia    H/O gastric bypass    Past Medical History:  Diagnosis Date   Abdominal wall abscess    Adjustment disorder with mixed anxiety and depressed mood 11/25/2013   Fibromyalgia    H/O gastric bypass    Mixed hyperlipidemia 11/25/2013    Family History  Problem Relation Age of Onset   Osteoporosis Mother    Emphysema Mother    Alzheimer's disease Father    Heart disease Father    Multiple sclerosis Sister     Past Surgical History:  Procedure Laterality Date   APPENDECTOMY     EXPLORATORY LAPAROTOMY  2006   post bypass, had intestinal preforation   GASTRIC BYPASS  03/02/2005   intestinal perforation     salivary stones removed     TONSILLECTOMY     WISDOM TOOTH  EXTRACTION     Social History   Occupational History   Not on file  Tobacco Use   Smoking status: Never   Smokeless tobacco: Never  Vaping Use   Vaping Use: Never used  Substance and Sexual Activity   Alcohol use: Yes    Comment: rarely   Drug use: Never   Sexual activity: Not Currently    Partners: Female    Birth control/protection: Post-menopausal

## 2023-02-19 IMAGING — MG MM DIGITAL SCREENING BILAT W/ TOMO AND CAD
6 of 10 series · 6 of 30 positions shown · non-contrast
Comparison: Previous exam(s).

CLINICAL DATA: Screening.

EXAM:
DIGITAL SCREENING BILATERAL MAMMOGRAM WITH TOMOSYNTHESIS AND CAD
TECHNIQUE: Bilateral screening digital craniocaudal and mediolateral oblique
mammograms were obtained. Bilateral screening digital breast
tomosynthesis was performed. The images were evaluated with
computer-aided detection.

[R MLO synth-2D (1 of 2)]
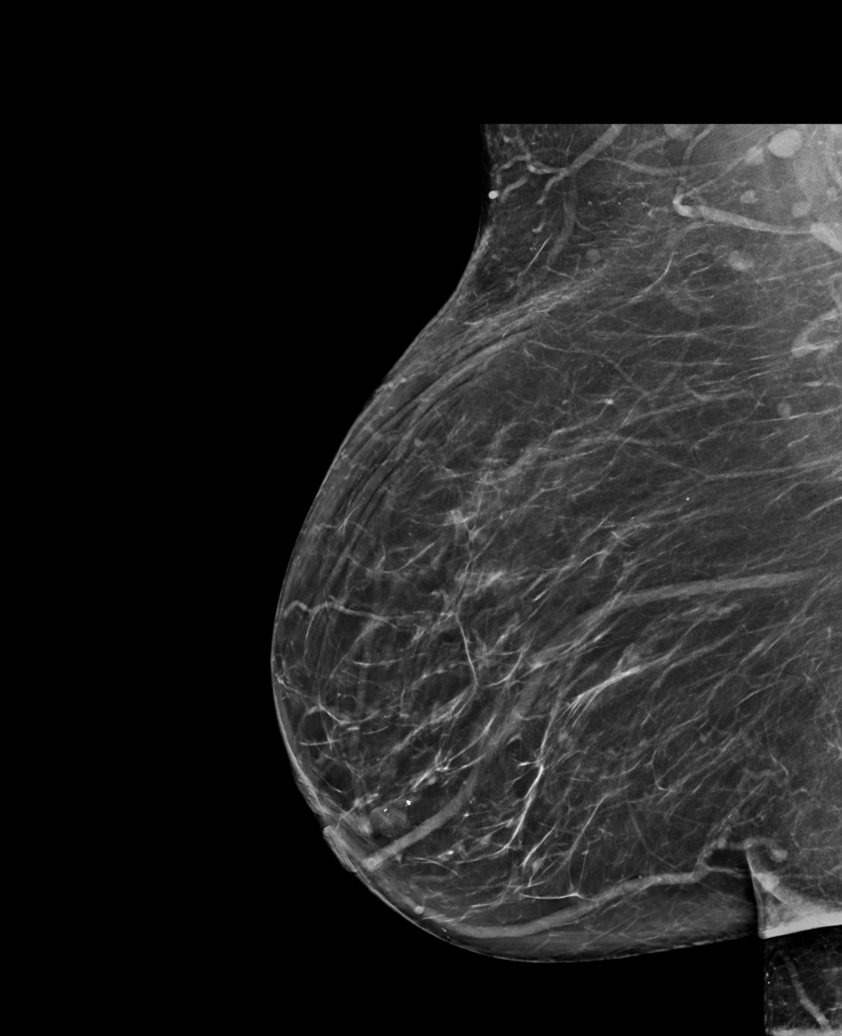

[L MLO synth-2D]
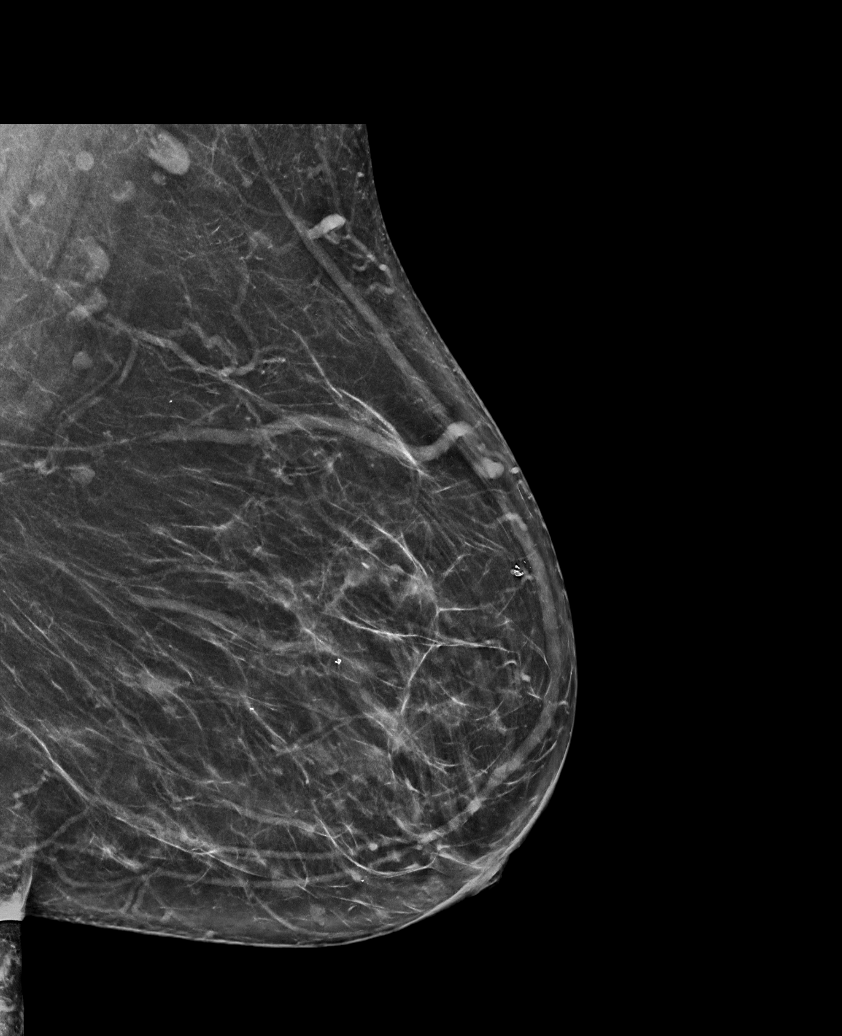

[R MLO synth-2D (2 of 2)]
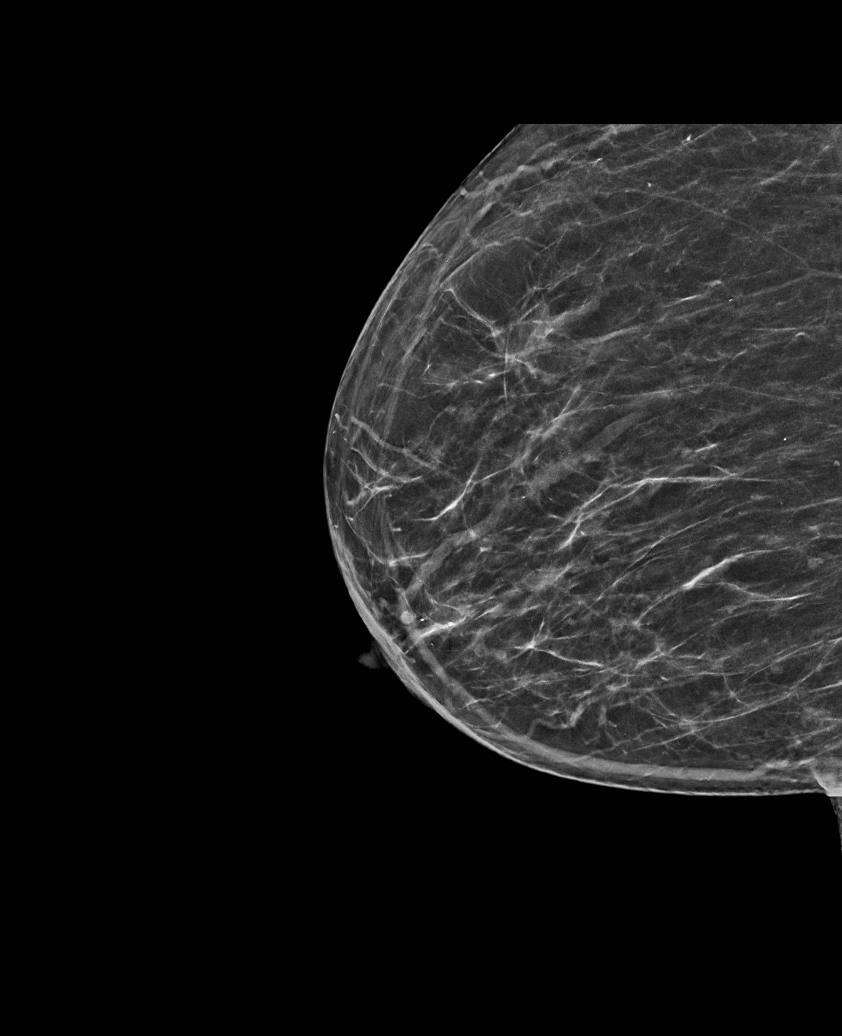

[R CC synth-2D]
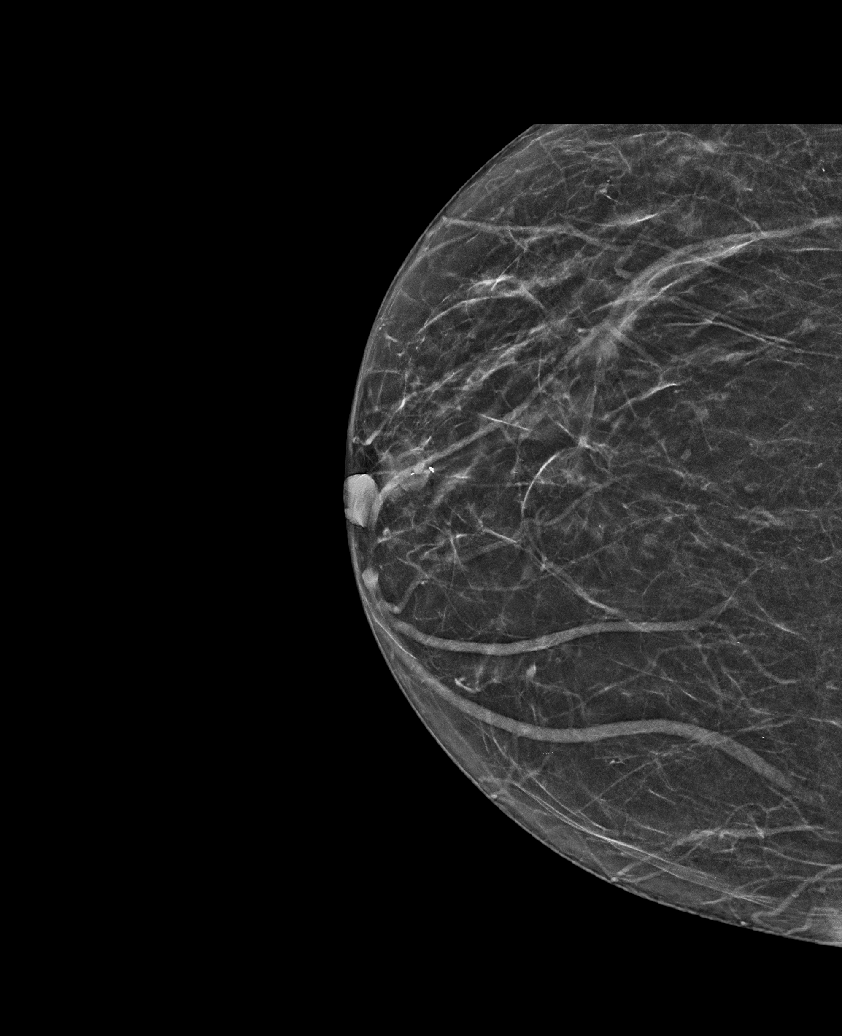

[L CC synth-2D]
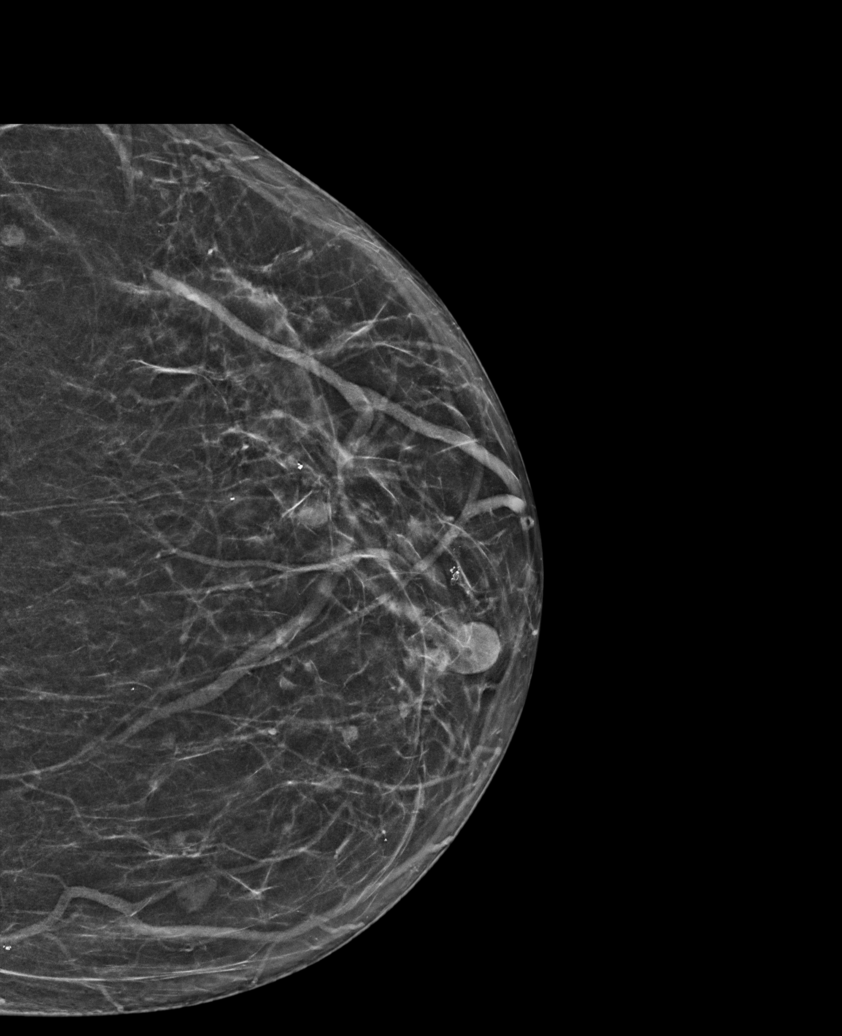

[R MLO tomo · tomo slice 33/64.0]
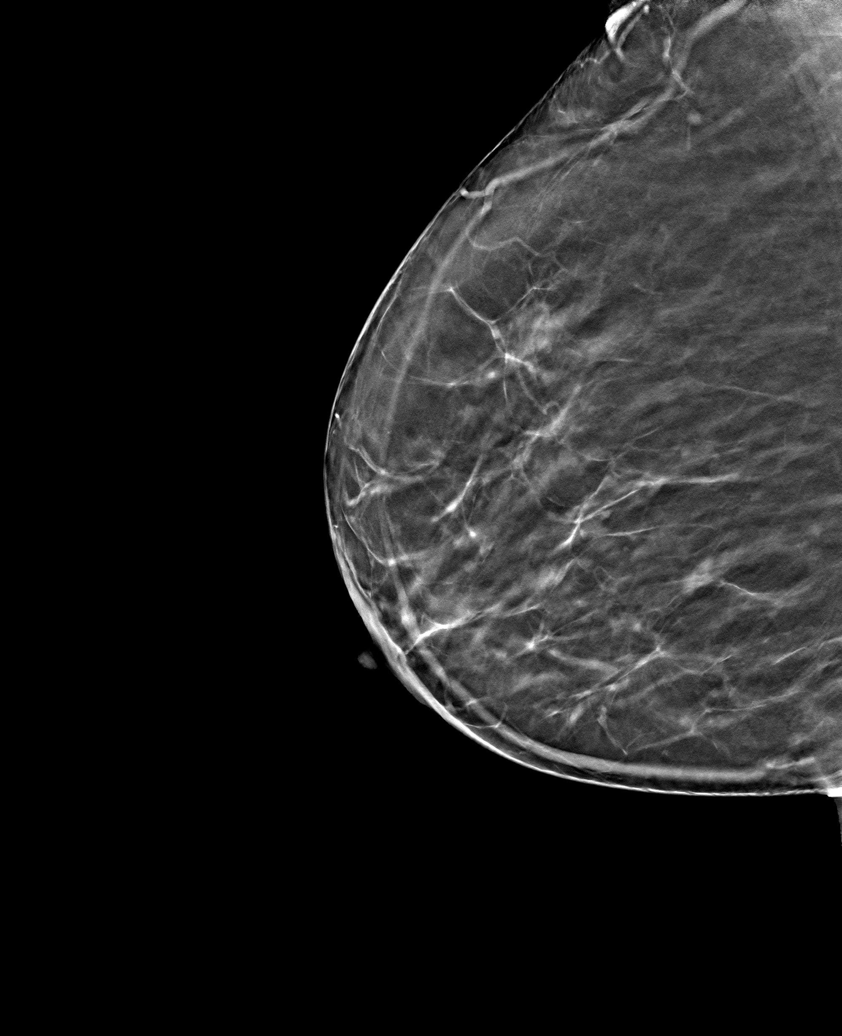

[6 of 30 positions shown; findings below may reference images not displayed]

ACR Breast Density Category c: The breast tissue is heterogeneously
dense, which may obscure small masses.
FINDINGS: There are no findings suspicious for malignancy.
IMPRESSION: No mammographic evidence of malignancy. A result letter of this
screening mammogram will be mailed directly to the patient.

RECOMMENDATION:
Screening mammogram in one year. (Code:Q3-W-BC3)

BI-RADS CATEGORY  1: Negative.

## 2023-02-21 ENCOUNTER — Encounter: Payer: Self-pay | Admitting: Orthopedic Surgery

## 2023-02-21 NOTE — Telephone Encounter (Signed)
Okay for 6 months 

## 2023-03-07 NOTE — Progress Notes (Deleted)
Assessment and Plan:  There are no diagnoses linked to this encounter.    Further disposition pending results of labs. Discussed med's effects and SE's.   Over 30 minutes of exam, counseling, chart review, and critical decision making was performed.   Future Appointments  Date Time Provider Department Center  03/08/2023  3:30 PM Raynelle Dick, NP [facility] None  04/06/2023 10:30 AM Raynelle Dick, NP [facility] None  04/15/2023 10:45 AM August Saucer Corrie Mckusick, MD [facility] None  10/03/2023 10:00 AM Raynelle Dick, NP [facility] None    ------------------------------------------------------------------------------------------------------------------   HPI There were no vitals taken for this visit. 67 y.o.female presents for  Past Medical History:  Diagnosis Date   Abdominal wall abscess    Adjustment disorder with mixed anxiety and depressed mood 11/25/2013   Fibromyalgia    H/O gastric bypass    Mixed hyperlipidemia 11/25/2013     Allergies  Allergen Reactions   E-Mycin [Erythromycin]     GI upset    Current Outpatient Medications on File Prior to Visit  Medication Sig   azithromycin (ZITHROMAX) 250 MG tablet Take 2 tablets (500 mg) on  Day 1,  followed by 1 tablet (250 mg) once daily on Days 2 through 5.   busPIRone (BUSPAR) 10 MG tablet TAKE ONE TABLET BY MOUTH THREE TIMES DAILY AS NEEDED FOR ANXIETY   Calcium Carbonate-Vit D-Min (CALCIUM 1200 PO) Take by mouth daily.   dexamethasone (DECADRON) 2 MG tablet Take 3 tabs for 3 days, 2 tabs for 3 days 1 tab for 5 days. Take with food.   meloxicam (MOBIC) 15 MG tablet Take 1 tablet (15 mg total) by mouth daily.   montelukast (SINGULAIR) 10 MG tablet Take 1 tablet (10 mg total) by mouth daily.   Multiple Vitamins-Minerals (MULTIVITAMIN WITH MINERALS) tablet Take 1 tablet by mouth daily.   olmesartan (BENICAR) 20 MG tablet Take 1 tablet (20 mg total) by mouth daily.   promethazine-dextromethorphan (PROMETHAZINE-DM) 6.25-15  MG/5ML syrup Take 5 mLs by mouth 4 (four) times daily as needed for cough.   No current facility-administered medications on file prior to visit.    ROS: all negative except above.   Physical Exam:  There were no vitals taken for this visit.  General Appearance: Well nourished, in no apparent distress. Eyes: PERRLA, EOMs, conjunctiva no swelling or erythema Sinuses: No Frontal/maxillary tenderness ENT/Mouth: Ext aud canals clear, TMs without erythema, bulging. No erythema, swelling, or exudate on post pharynx.  Tonsils not swollen or erythematous. Hearing normal.  Neck: Supple, thyroid normal.  Respiratory: Respiratory effort normal, BS equal bilaterally without rales, rhonchi, wheezing or stridor.  Cardio: RRR with no MRGs. Brisk peripheral pulses without edema.  Abdomen: Soft, + BS.  Non tender, no guarding, rebound, hernias, masses. Lymphatics: Non tender without lymphadenopathy.  Musculoskeletal: Full ROM, 5/5 strength, normal gait.  Skin: Warm, dry without rashes, lesions, ecchymosis.  Neuro: Cranial nerves intact. Normal muscle tone, no cerebellar symptoms. Sensation intact.  Psych: Awake and oriented X 3, normal affect, Insight and Judgment appropriate.     Raynelle Dick, NP 11:03 AM [facility]

## 2023-03-08 ENCOUNTER — Ambulatory Visit: Payer: Medicare Other | Admitting: Nurse Practitioner

## 2023-03-09 ENCOUNTER — Ambulatory Visit: Payer: Medicare Other | Admitting: Orthopedic Surgery

## 2023-03-09 ENCOUNTER — Encounter: Payer: Self-pay | Admitting: Nurse Practitioner

## 2023-03-09 ENCOUNTER — Ambulatory Visit (INDEPENDENT_AMBULATORY_CARE_PROVIDER_SITE_OTHER): Payer: Medicare Other | Admitting: Nurse Practitioner

## 2023-03-09 VITALS — BP 134/76 | HR 61 | Temp 97.9°F | Ht 62.0 in | Wt 250.2 lb

## 2023-03-09 DIAGNOSIS — R051 Acute cough: Secondary | ICD-10-CM | POA: Diagnosis not present

## 2023-03-09 DIAGNOSIS — J069 Acute upper respiratory infection, unspecified: Secondary | ICD-10-CM

## 2023-03-09 DIAGNOSIS — Z1152 Encounter for screening for COVID-19: Secondary | ICD-10-CM

## 2023-03-09 DIAGNOSIS — R062 Wheezing: Secondary | ICD-10-CM

## 2023-03-09 DIAGNOSIS — R6889 Other general symptoms and signs: Secondary | ICD-10-CM | POA: Diagnosis not present

## 2023-03-09 LAB — POC COVID19 BINAXNOW: SARS Coronavirus 2 Ag: NEGATIVE

## 2023-03-09 LAB — POCT INFLUENZA A/B
Influenza A, POC: NEGATIVE
Influenza B, POC: NEGATIVE

## 2023-03-09 MED ORDER — DOXYCYCLINE HYCLATE 100 MG PO TABS
100.0000 mg | ORAL_TABLET | Freq: Two times a day (BID) | ORAL | 0 refills | Status: AC
Start: 2023-03-09 — End: 2023-03-16

## 2023-03-09 MED ORDER — PROMETHAZINE-DM 6.25-15 MG/5ML PO SYRP
5.0000 mL | ORAL_SOLUTION | Freq: Four times a day (QID) | ORAL | 0 refills | Status: DC | PRN
Start: 2023-03-09 — End: 2023-04-06

## 2023-03-09 MED ORDER — DEXAMETHASONE 2 MG PO TABS
ORAL_TABLET | ORAL | 0 refills | Status: DC
Start: 2023-03-09 — End: 2023-04-06

## 2023-03-09 NOTE — Patient Instructions (Signed)

## 2023-03-09 NOTE — Progress Notes (Signed)
Assessment and Plan:  Nicole Padilla was seen today for an episodic visit.  Diagnoses and all order for this visit:  1. Flu-like symptoms Negative  - POCT Influenza A/B  2. Encounter for screening for COVID-19 Negative  - POC COVID-19  3. URI, acute Given length of symptoms start tmt with abx - not responsive to Z-Pak in the past.  - doxycycline (VIBRA-TABS) 100 MG tablet; Take 1 tablet (100 mg total) by mouth 2 (two) times daily for 7 days.  Dispense: 14 tablet; Refill: 0  4. Acute cough Stay well hydrated to keep mucus thin and productive Continue Promethazine.  - promethazine-dextromethorphan (PROMETHAZINE-DM) 6.25-15 MG/5ML syrup; Take 5 mLs by mouth 4 (four) times daily as needed for cough.  Dispense: 240 mL; Refill: 0  5. Wheezing Recommend SABA inhaler - patient defers Start Decadron steroid taper Continue to monitor  - dexamethasone (DECADRON) 2 MG tablet; Take 3 tabs for 3 days, 2 tabs for 3 days 1 tab for 5 days. Take with food.  Dispense: 20 tablet; Refill: 0  Notify office for further evaluation and treatment, questions or concerns if s/s fail to improve. The risks and benefits of my recommendations, as well as other treatment options were discussed with the patient today. Questions were answered.  Further disposition pending results of labs. Discussed med's effects and SE's.    Over 15 minutes of exam, counseling, chart review, and critical decision making was performed.   Future Appointments  Date Time Provider Department Center  04/06/2023 10:30 AM Raynelle Dick, NP [facility] None  04/15/2023 10:45 AM August Saucer Corrie Mckusick, MD [facility] None  10/03/2023 10:00 AM Raynelle Dick, NP [facility] None    ------------------------------------------------------------------------------------------------------------------   HPI BP 134/76   Pulse 61   Temp 97.9 F (36.6 C)   Ht 5\' 2"  (1.575 m)   Wt 250 lb 3.2 oz (113.5 kg)   SpO2 98%   BMI 45.76 kg/m     Patient complains of symptoms of a URI, possible sinusitis. Symptoms include congestion, headache described as pressure, nasal congestion, post nasal drip, sinus pressure, sore throat, and wheezing. Onset of symptoms was 10 days ago, and has been gradually worsening since that time. Treatment to date: antihistamines, cough suppressants, and decongestants without relief.  Denies fever, chills, N/V.  Past Medical History:  Diagnosis Date   Abdominal wall abscess    Adjustment disorder with mixed anxiety and depressed mood 11/25/2013   Fibromyalgia    H/O gastric bypass    Mixed hyperlipidemia 11/25/2013     Allergies  Allergen Reactions   E-Mycin [Erythromycin]     GI upset    Current Outpatient Medications on File Prior to Visit  Medication Sig   busPIRone (BUSPAR) 10 MG tablet TAKE ONE TABLET BY MOUTH THREE TIMES DAILY AS NEEDED FOR ANXIETY   Calcium Carbonate-Vit D-Min (CALCIUM 1200 PO) Take by mouth daily.   meloxicam (MOBIC) 15 MG tablet Take 1 tablet (15 mg total) by mouth daily.   Multiple Vitamins-Minerals (MULTIVITAMIN WITH MINERALS) tablet Take 1 tablet by mouth daily.   olmesartan (BENICAR) 20 MG tablet Take 1 tablet (20 mg total) by mouth daily.   promethazine-dextromethorphan (PROMETHAZINE-DM) 6.25-15 MG/5ML syrup Take 5 mLs by mouth 4 (four) times daily as needed for cough.   azithromycin (ZITHROMAX) 250 MG tablet Take 2 tablets (500 mg) on  Day 1,  followed by 1 tablet (250 mg) once daily on Days 2 through 5.   dexamethasone (DECADRON) 2 MG tablet Take  3 tabs for 3 days, 2 tabs for 3 days 1 tab for 5 days. Take with food.   montelukast (SINGULAIR) 10 MG tablet Take 1 tablet (10 mg total) by mouth daily.   No current facility-administered medications on file prior to visit.    ROS: all negative except what is noted in the HPI.   Physical Exam:  BP 134/76   Pulse 61   Temp 97.9 F (36.6 C)   Ht 5\' 2"  (1.575 m)   Wt 250 lb 3.2 oz (113.5 kg)   SpO2 98%   BMI  45.76 kg/m   General Appearance: NAD.  Awake, conversant and cooperative. Eyes: PERRLA, EOMs intact.  Sclera white.  Conjunctiva without erythema. Sinuses: No frontal/maxillary tenderness.  No nasal discharge. Nares patent.  ENT/Mouth: Ext aud canals clear.  Bilateral TMs w/DOL and without erythema or bulging. Hearing intact.  Posterior pharynx without swelling or exudate.  Tonsils without swelling or erythema.  Neck: Supple.  No masses, nodules or thyromegaly. Respiratory: Effort is regular with non-labored breathing. Breath sounds are equal bilaterally with scattered wheezing R>L posterior expiration.   Cardio: RRR with no MRGs. Brisk peripheral pulses without edema.  Abdomen: Active BS in all four quadrants.  Soft and non-tender without guarding, rebound tenderness, hernias or masses. Lymphatics: Non tender without lymphadenopathy.  Musculoskeletal: Full ROM, 5/5 strength, normal ambulation.  No clubbing or cyanosis. Skin: Appropriate color for ethnicity. Warm without rashes, lesions, ecchymosis, ulcers.  Neuro: CN II-XII grossly normal. Normal muscle tone without cerebellar symptoms and intact sensation.   Psych: AO X 3,  appropriate mood and affect, insight and judgment.     Adela Glimpse, NP 10:57 AM [facility]

## 2023-03-15 ENCOUNTER — Encounter: Payer: Self-pay | Admitting: Nurse Practitioner

## 2023-03-15 DIAGNOSIS — J069 Acute upper respiratory infection, unspecified: Secondary | ICD-10-CM

## 2023-03-15 DIAGNOSIS — R051 Acute cough: Secondary | ICD-10-CM

## 2023-03-15 DIAGNOSIS — R062 Wheezing: Secondary | ICD-10-CM

## 2023-03-15 DIAGNOSIS — R6889 Other general symptoms and signs: Secondary | ICD-10-CM

## 2023-03-16 ENCOUNTER — Ambulatory Visit
Admission: RE | Admit: 2023-03-16 | Discharge: 2023-03-16 | Disposition: A | Discharge status: Home / Self Care | Payer: Medicare Other | Source: Ambulatory Visit | Attending: Nurse Practitioner | Admitting: Nurse Practitioner

## 2023-03-16 DIAGNOSIS — R062 Wheezing: Secondary | ICD-10-CM | POA: Diagnosis not present

## 2023-03-16 DIAGNOSIS — J069 Acute upper respiratory infection, unspecified: Secondary | ICD-10-CM

## 2023-03-16 DIAGNOSIS — R051 Acute cough: Secondary | ICD-10-CM

## 2023-03-16 DIAGNOSIS — R059 Cough, unspecified: Secondary | ICD-10-CM | POA: Diagnosis not present

## 2023-03-24 ENCOUNTER — Encounter: Payer: Self-pay | Admitting: Nurse Practitioner

## 2023-03-24 ENCOUNTER — Ambulatory Visit
Admission: EM | Admit: 2023-03-24 | Discharge: 2023-03-24 | Disposition: A | Discharge status: Home / Self Care | Payer: Medicare Other | Attending: Nurse Practitioner | Admitting: Nurse Practitioner

## 2023-03-24 DIAGNOSIS — J0101 Acute recurrent maxillary sinusitis: Secondary | ICD-10-CM | POA: Diagnosis not present

## 2023-03-24 DIAGNOSIS — J069 Acute upper respiratory infection, unspecified: Secondary | ICD-10-CM | POA: Diagnosis not present

## 2023-03-24 MED ORDER — AZITHROMYCIN 250 MG PO TABS
250.0000 mg | ORAL_TABLET | Freq: Every day | ORAL | 0 refills | Status: DC
Start: 2023-03-24 — End: 2023-04-06

## 2023-03-24 MED ORDER — FLUTICASONE PROPIONATE 50 MCG/ACT NA SUSP
1.0000 | Freq: Every day | NASAL | 0 refills | Status: AC
Start: 2023-03-24 — End: ?

## 2023-03-24 NOTE — Discharge Instructions (Signed)
Start Zithromax as prescribed Flonase daily Nasal rinses as tolerated Rest and fluids Follow-up with your PCP if your symptoms do not improve Please go to the ER for any worsening symptoms

## 2023-03-24 NOTE — ED Triage Notes (Signed)
Pt presents with cough, congestion, dizziness and headaches x 1 month.   Pt went to PCP and was given medication for relief. Still has sxs after completing treatment. Pt states she is feeling worse.

## 2023-03-24 NOTE — ED Provider Notes (Addendum)
[facility]    CSN: [id number] Arrival date & time: 03/24/23  1239      History   Chief Complaint Chief Complaint  Patient presents with   Cough   Nasal Congestion   Dizziness    HPI Nicole Padilla is a 67 y.o. female  presents for evaluation of URI symptoms for 3-4 weeks.  Patient states she has a history of chronic sinus infections.  Patient reports associated symptoms of sinus pressure/pain with postnasal drip causing cough, congestion, headaches, dizziness. Denies N/V/D, fevers, ear pain, body aches, shortness of breath. Patient does not have a hx of asthma or smoking. No known sick contacts.  Patient was seen by her PCP on 5/22 for same symptoms.  She was prescribed doxycycline, Promethazine DM, and dexamethasone.  Also reports she had a negative chest x-ray.  She reports no improvement with that treatment.  States typically Zithromax is the best antibiotic for her.  Pt has taken Sudafed OTC for symptoms. Pt has no other concerns at this time.    Cough Dizziness   Past Medical History:  Diagnosis Date   Abdominal wall abscess    Adjustment disorder with mixed anxiety and depressed mood 11/25/2013   Fibromyalgia    H/O gastric bypass    Mixed hyperlipidemia 11/25/2013    Patient Active Problem List   Diagnosis Date Noted   COVID-19 11/04/2021   Rotator cuff impingement syndrome of left shoulder 06/18/2021   B12 deficiency 06/03/2021   Iron deficiency 06/03/2021   Morbid obesity (HCC) 07/17/2018   Other abnormal glucose (prediabetes) 12/31/2014   Essential hypertension, benign 12/31/2014   Medication management 12/31/2014   Vitamin D deficiency 12/31/2014   Mixed hyperlipidemia 11/25/2013   Adjustment disorder with mixed anxiety and depressed mood 11/25/2013   Fibromyalgia    H/O gastric bypass     Past Surgical History:  Procedure Laterality Date   APPENDECTOMY     EXPLORATORY LAPAROTOMY  2006   post bypass, had intestinal preforation   GASTRIC  BYPASS  03/02/2005   intestinal perforation     salivary stones removed     TONSILLECTOMY     WISDOM TOOTH EXTRACTION      OB History   No obstetric history on file.      Home Medications    Prior to Admission medications   Medication Sig Start Date End Date Taking? Authorizing Provider  azithromycin (ZITHROMAX) 250 MG tablet Take 1 tablet (250 mg total) by mouth daily. Take first 2 tablets together, then 1 every day until finished. 03/24/23  Yes Radford Pax, NP  fluticasone (FLONASE) 50 MCG/ACT nasal spray Place 1 spray into both nostrils daily. 03/24/23  Yes Radford Pax, NP  busPIRone (BUSPAR) 10 MG tablet TAKE ONE TABLET BY MOUTH THREE TIMES DAILY AS NEEDED FOR ANXIETY 12/16/22   Raynelle Dick, NP  Calcium Carbonate-Vit D-Min (CALCIUM 1200 PO) Take by mouth daily.    [provider]  dexamethasone (DECADRON) 2 MG tablet Take 3 tabs for 3 days, 2 tabs for 3 days 1 tab for 5 days. Take with food. 03/09/23   Adela Glimpse, NP  meloxicam (MOBIC) 15 MG tablet Take 1 tablet (15 mg total) by mouth daily. 11/03/22   Magnant, Charles L, PA-C  montelukast (SINGULAIR) 10 MG tablet Take 1 tablet (10 mg total) by mouth daily. 10/01/22 10/01/23  Raynelle Dick, NP  Multiple Vitamins-Minerals (MULTIVITAMIN WITH MINERALS) tablet Take 1 tablet by mouth daily.    [provider]  olmesartan (BENICAR) 20 MG tablet Take 1 tablet (20 mg total) by mouth daily. 09/13/22   Raynelle Dick, NP  promethazine-dextromethorphan (PROMETHAZINE-DM) 6.25-15 MG/5ML syrup Take 5 mLs by mouth 4 (four) times daily as needed for cough. 03/09/23   Adela Glimpse, NP    Family History Family History  Problem Relation Age of Onset   Osteoporosis Mother    Emphysema Mother    Alzheimer's disease Father    Heart disease Father    Multiple sclerosis Sister     Social History Social History   Tobacco Use   Smoking status: Never   Smokeless tobacco: Never  Vaping Use   Vaping Use: Never  used  Substance Use Topics   Alcohol use: Yes    Comment: rarely   Drug use: Never     Allergies   E-mycin [erythromycin]   Review of Systems Review of Systems  HENT:  Positive for congestion, sinus pressure and sinus pain.   Respiratory:  Positive for cough.   Neurological:  Positive for dizziness.     Physical Exam Triage Vital Signs ED Triage Vitals  Enc Vitals Group     BP 03/24/23 1250 (!) 155/84     Pulse Rate 03/24/23 1249 79     Resp 03/24/23 1249 18     Temp 03/24/23 1249 97.9 F (36.6 C)     Temp Source 03/24/23 1249 Oral     SpO2 03/24/23 1249 96 %     Weight --      Height --      Head Circumference --      Peak Flow --      Pain Score 03/24/23 1249 5     Pain Loc --      Pain Edu? --      Excl. in GC? --    No data found.  Updated Vital Signs BP (!) 155/84   Pulse 79   Temp 97.9 F (36.6 C) (Oral)   Resp 18   SpO2 96%   Visual Acuity Right Eye Distance:   Left Eye Distance:   Bilateral Distance:    Right Eye Near:   Left Eye Near:    Bilateral Near:     Physical Exam Vitals and nursing note reviewed.  Constitutional:      General: She is not in acute distress.    Appearance: Normal appearance. She is well-developed. She is not ill-appearing.  HENT:     Head: Normocephalic and atraumatic.     Right Ear: Tympanic membrane and ear canal normal.     Left Ear: Tympanic membrane and ear canal normal.     Nose: Congestion present.     Right Turbinates: Swollen and pale.     Left Turbinates: Swollen and pale.     Right Sinus: Maxillary sinus tenderness present.     Left Sinus: Maxillary sinus tenderness present.     Mouth/Throat:     Mouth: Mucous membranes are moist.     Pharynx: Oropharynx is clear. Uvula midline. No posterior oropharyngeal erythema.     Tonsils: No tonsillar exudate or tonsillar abscesses.  Eyes:     Conjunctiva/sclera: Conjunctivae normal.     Pupils: Pupils are equal, round, and reactive to light.   Cardiovascular:     Rate and Rhythm: Normal rate and regular rhythm.     Heart sounds: Normal heart sounds.  Pulmonary:     Effort: Pulmonary effort is normal.     Breath sounds:  Normal breath sounds.  Musculoskeletal:     Cervical back: Normal range of motion and neck supple.  Lymphadenopathy:     Cervical: No cervical adenopathy.  Skin:    General: Skin is warm and dry.  Neurological:     General: No focal deficit present.     Mental Status: She is alert and oriented to person, place, and time.  Psychiatric:        Mood and Affect: Mood normal.        Behavior: Behavior normal.      UC Treatments / Results  Labs (all labs ordered are listed, but only abnormal results are displayed) Labs Reviewed - No data to display  EKG   Radiology No results found.  Procedures Procedures (including critical care time)  Medications Ordered in UC Medications - No data to display  Initial Impression / Assessment and Plan / UC Course  I have reviewed the triage vital signs and the nursing notes.  Pertinent labs & imaging results that were available during my care of the patient were reviewed by me and considered in my medical decision making (see chart for details).     Reviewed exam and symptoms with patient.  No red flags.  Will start Zithromax and Flonase Nasal rinses as tolerated She can continue (DM as needed for cough PCP follow-up if symptoms do not improve ER precautions reviewed and patient verbalized understanding Final Clinical Impressions(s) / UC Diagnoses   Final diagnoses:  Acute recurrent maxillary sinusitis     Discharge Instructions      Start Zithromax as prescribed Flonase daily Nasal rinses as tolerated Rest and fluids Follow-up with your PCP if your symptoms do not improve Please go to the ER for any worsening symptoms   ED Prescriptions     Medication Sig Dispense Auth. Provider   azithromycin (ZITHROMAX) 250 MG tablet Take 1 tablet (250  mg total) by mouth daily. Take first 2 tablets together, then 1 every day until finished. 6 tablet Radford Pax, NP   fluticasone (FLONASE) 50 MCG/ACT nasal spray Place 1 spray into both nostrils daily. 15.8 mL Radford Pax, NP      PDMP not reviewed this encounter.   Radford Pax, NP 03/24/23 1325    Radford Pax, NP 03/24/23 1326

## 2023-03-30 ENCOUNTER — Other Ambulatory Visit: Payer: Self-pay | Admitting: Surgical

## 2023-03-30 ENCOUNTER — Other Ambulatory Visit: Payer: Self-pay | Admitting: Nurse Practitioner

## 2023-03-30 DIAGNOSIS — F4323 Adjustment disorder with mixed anxiety and depressed mood: Secondary | ICD-10-CM

## 2023-03-30 MED ORDER — BUSPIRONE HCL 10 MG PO TABS
10.0000 mg | ORAL_TABLET | Freq: Three times a day (TID) | ORAL | 0 refills | Status: DC | PRN
Start: 2023-03-30 — End: 2023-05-24

## 2023-04-06 ENCOUNTER — Ambulatory Visit (INDEPENDENT_AMBULATORY_CARE_PROVIDER_SITE_OTHER): Payer: Medicare Other | Admitting: Nurse Practitioner

## 2023-04-06 ENCOUNTER — Encounter: Payer: Self-pay | Admitting: Nurse Practitioner

## 2023-04-06 VITALS — BP 128/78 | HR 71 | Temp 97.7°F | Ht 62.5 in | Wt 255.4 lb

## 2023-04-06 DIAGNOSIS — E559 Vitamin D deficiency, unspecified: Secondary | ICD-10-CM

## 2023-04-06 DIAGNOSIS — R7309 Other abnormal glucose: Secondary | ICD-10-CM

## 2023-04-06 DIAGNOSIS — I10 Essential (primary) hypertension: Secondary | ICD-10-CM | POA: Diagnosis not present

## 2023-04-06 DIAGNOSIS — Z79899 Other long term (current) drug therapy: Secondary | ICD-10-CM | POA: Diagnosis not present

## 2023-04-06 DIAGNOSIS — M797 Fibromyalgia: Secondary | ICD-10-CM

## 2023-04-06 DIAGNOSIS — L819 Disorder of pigmentation, unspecified: Secondary | ICD-10-CM

## 2023-04-06 DIAGNOSIS — F4323 Adjustment disorder with mixed anxiety and depressed mood: Secondary | ICD-10-CM

## 2023-04-06 DIAGNOSIS — Z9884 Bariatric surgery status: Secondary | ICD-10-CM

## 2023-04-06 DIAGNOSIS — E782 Mixed hyperlipidemia: Secondary | ICD-10-CM | POA: Diagnosis not present

## 2023-04-06 LAB — CBC WITH DIFFERENTIAL/PLATELET
Absolute Monocytes: 378 cells/uL (ref 200–950)
Monocytes Relative: 5.9 %
Neutrophils Relative %: 65.9 %

## 2023-04-06 LAB — COMPLETE METABOLIC PANEL WITH GFR
ALT: 15 U/L (ref 6–29)
Albumin: 4.2 g/dL (ref 3.6–5.1)
Globulin: 2.7 g/dL (calc) (ref 1.9–3.7)
Total Protein: 6.9 g/dL (ref 6.1–8.1)

## 2023-04-06 LAB — LIPID PANEL: LDL Cholesterol (Calc): 128 mg/dL (calc) — ABNORMAL HIGH

## 2023-04-06 LAB — MAGNESIUM: Magnesium: 2.1 mg/dL (ref 1.5–2.5)

## 2023-04-06 MED ORDER — PHENTERMINE HCL 37.5 MG PO TABS
ORAL_TABLET | ORAL | 0 refills | Status: DC
Start: 2023-04-06 — End: 2023-05-31

## 2023-04-06 NOTE — Patient Instructions (Signed)
Fair life protein shakes Eat more frequently - try not to go more than 6 hours without protein Aim for 90 grams of protein a day- 30 breakfast/30 lunch 30 dinner Try to keep net carbs less than 50 Net Carbs=Total Carbs-fiber- sugar alcohols Exercise heartrate 120-140(fat burning zone)- walking 20-30 minutes 4 days a week  

## 2023-04-06 NOTE — Progress Notes (Signed)
6 MONTH FOLLOW UP  Assessment and Plan:  Nicole Padilla was seen today for follow-up.  Diagnoses and all orders for this visit:    Essential hypertension, benign - continue medications, DASH diet, exercise and monitor at home. Call if greater than 130/80.  -     CBC with Differential/Platelet -     COMPLETE METABOLIC PANEL WITH GFR  Adjustment disorder with mixed anxiety and depressed mood Continue Buspar 10 mg TID Symptoms are well controlled.  Continue to monitor   Mixed hyperlipidemia check lipids- if LDL remains elevated but only slightly would like to continue plant based diet and increasing activity, if greater than 130 will recommend low dose of Rosuvastatin decrease fatty foods increase activity.  -     Lipid panel   Abnormal glucose -     Hemoglobin A1c Discussed disease progression and risks Discussed diet/exercise, weight management and risk modification   Fibromyalgia Continue Meloxicam as needed, will work on healthy lifestyle this year   Morbid obesity (HCC) - follow up 3 months for progress monitoring - increase veggies, decrease carbs - long discussion about weight loss, diet, and exercise - hasn't done well with meds, has done well with lifesytle; will get mood and pain better controlled and revisit later this year - Start Phentermine 37.5 mg 1 tab daily Follow up in 4 weeks - TSH  Skin lesion of head Derm cancelled qppt x 3 Will call today to get appointment scheduled for evaluation   H/O gastric bypass Monitor for nutritional deficiencies Check B12 and iron today    Vitamin D deficiency Continue Vit D supplementation to maintain value in therapeutic level of 60-100    Medication Management -TSH - Magnesium  Further disposition pending results of labs. Discussed med's effects and SE's.   Over 20 minutes of exam, counseling, chart review, and critical decision making was performed.   Future Appointments  Date Time Provider Department Center   04/15/2023 10:45 AM Cammy Copa, MD [facility] None  10/03/2023 10:00 AM Raynelle Dick, NP [facility] None    ------------------------------------------------------------------------------------------------------------------   HPI BP 128/78   Pulse 71   Temp 97.7 F (36.5 C)   Ht 5' 2.5" (1.588 m)   Wt 255 lb 6.4 oz (115.8 kg)   SpO2 99%   BMI 45.97 kg/m  67 y.o.female presents for 3 week follow up on face lesions, also for left shoulder pain and mood/anxiety.   She is now making necklaces and it is going well.   She still has area removed on her left temple between 1-2 cm that is reddish with crusting , was referred to dermatology and has had appt cancelled 3 times and did not return her call.  She is planning to call dermatologist in [place] and schedule an appointment.    She was evaluated at ED for sinus infection 03/24/23 and treated with Zithromax and Flonase. Symptoms have improved.    She is currently on Benicar 40 mg daily and BP is well controlled.  Denies headaches, chest pain, shortness of breath and dizziness BP Readings from Last 3 Encounters:  04/06/23 128/78  03/24/23 (!) 155/84  03/09/23 134/76   BMI is Body mass index is 45.97 kg/m., she has been working on diet and exercise. She was down 17 and went up to 263 with 2 courses of dexamethasone.  Has gone 8 pounds. She is watching her diet- 98% plant based. She continues to have knees and hip pain so difficulty walking, does plan to restart  swimming.  Wt Readings from Last 3 Encounters:  04/06/23 255 lb 6.4 oz (115.8 kg)  03/09/23 250 lb 3.2 oz (113.5 kg)  10/01/22 241 lb 3.2 oz (109.4 kg)   Cholesterol is not at goal, not currently on medication.  Attempting to lower with diet and exercise. May consider statin if remains elevated.  Lab Results  Component Value Date   CHOL 206 (H) 10/01/2022   HDL 64 10/01/2022   LDLCALC 123 (H) 10/01/2022   TRIG 91 10/01/2022   CHOLHDL 3.2 10/01/2022    She is  focusing on diet and exercise for abnormal glucose Lab Results  Component Value Date   HGBA1C 5.9 (H) 10/01/2022     She has FM and depression, has had negative autoimmune work up and has seen rheum, she is on buspar 10 mg TID.  She does have persistent fatigue. Took Cymbalta in the past but did not help fibromyalgia pain.  She is using cannabis ointment and Meloxicam is helping with pain. She has stabbing pain on tops of feet- she has bone spurs on top of feet.   She does see Dr. August Saucer for injections for her L knee- has done steroid and gel injections.     Past Medical History:  Diagnosis Date   Abdominal wall abscess    Adjustment disorder with mixed anxiety and depressed mood 11/25/2013   Fibromyalgia    H/O gastric bypass    Mixed hyperlipidemia 11/25/2013     Allergies  Allergen Reactions   E-Mycin [Erythromycin]     GI upset    Current Outpatient Medications on File Prior to Visit  Medication Sig   busPIRone (BUSPAR) 10 MG tablet Take 1 tablet (10 mg total) by mouth 3 (three) times daily as needed. for anxiety   Calcium Carbonate-Vit D-Min (CALCIUM 1200 PO) Take by mouth daily.   cetirizine (ZYRTEC) 10 MG tablet Take 10 mg by mouth daily.   fluticasone (FLONASE) 50 MCG/ACT nasal spray Place 1 spray into both nostrils daily.   meloxicam (MOBIC) 15 MG tablet Take 1 tablet (15 mg total) by mouth daily.   Multiple Vitamins-Minerals (MULTIVITAMIN WITH MINERALS) tablet Take 1 tablet by mouth daily.   olmesartan (BENICAR) 20 MG tablet Take 1 tablet (20 mg total) by mouth daily.   montelukast (SINGULAIR) 10 MG tablet Take 1 tablet (10 mg total) by mouth daily.   No current facility-administered medications on file prior to visit.    ROS: all negative except above.   Physical Exam:  BP 128/78   Pulse 71   Temp 97.7 F (36.5 C)   Ht 5' 2.5" (1.588 m)   Wt 255 lb 6.4 oz (115.8 kg)   SpO2 99%   BMI 45.97 kg/m  General Appearance: Well nourished well developed, in no  apparent distress. Eyes: PERRLA, EOMs, conjunctiva no swelling or erythema ENT/Mouth: Ear canals normal without obstruction, swelling, erythma, discharge.  TMs normal bilaterally.  Oropharynx erythema, no exudate Neck: Supple, thyroid normal,no cervical adenopathy  Respiratory: Respiratory effort normal, Breath sounds clear A&P without rhonchi, wheeze, or rale.  No retractions, no accessory usage. Cardio: RRR with no MRGs. Brisk peripheral pulses without edema.  Abdomen: Soft, + BS,  Non tender, no guarding, rebound, hernias, masses. Musculoskeletal: Full ROM , crepitus of left knee with extension. Antalgicl gait,  Skin: Warm, dry without rashes, ecchymosis. She has scaly/keratotic round lesion approx 2cmX1cm to left temple now 2 different colors Neuro: Awake and oriented X 3, Cranial nerves intact. Normal muscle tone,  no cerebellar symptoms. Psych: Depressed/tearful affect, Insight and Judgment appropriate.       Raynelle Dick, NP 10:40 AM [facility]

## 2023-04-07 LAB — LIPID PANEL
Cholesterol: 215 mg/dL — ABNORMAL HIGH (ref <200)
HDL: 68 mg/dL (ref >=50)
Non-HDL Cholesterol (Calc): 147 mg/dL (calc) — ABNORMAL HIGH (ref <130)
Total CHOL/HDL Ratio: 3.2 (calc) (ref <5.0)
Triglycerides: 88 mg/dL (ref <150)

## 2023-04-07 LAB — COMPLETE METABOLIC PANEL WITH GFR
AG Ratio: 1.6 (calc) (ref 1.0–2.5)
AST: 16 U/L (ref 10–35)
Alkaline phosphatase (APISO): 79 U/L (ref 37–153)
BUN: 15 mg/dL (ref 7–25)
CO2: 29 mmol/L (ref 20–32)
Calcium: 9.5 mg/dL (ref 8.6–10.4)
Chloride: 105 mmol/L (ref 98–110)
Creat: 0.7 mg/dL (ref 0.50–1.05)
Glucose, Bld: 97 mg/dL (ref 65–99)
Potassium: 4.6 mmol/L (ref 3.5–5.3)
Sodium: 140 mmol/L (ref 135–146)
Total Bilirubin: 0.4 mg/dL (ref 0.2–1.2)
eGFR: 95 mL/min/{1.73_m2} (ref >=60)

## 2023-04-07 LAB — CBC WITH DIFFERENTIAL/PLATELET
Basophils Absolute: 38 cells/uL (ref 0–200)
Basophils Relative: 0.6 %
Eosinophils Absolute: 83 cells/uL (ref 15–500)
Eosinophils Relative: 1.3 %
HCT: 44.4 % (ref 35.0–45.0)
Hemoglobin: 14.3 g/dL (ref 11.7–15.5)
Lymphs Abs: 1683 cells/uL (ref 850–3900)
MCH: 27.7 pg (ref 27.0–33.0)
MCHC: 32.2 g/dL (ref 32.0–36.0)
MCV: 86 fL (ref 80.0–100.0)
MPV: 12.2 fL (ref 7.5–12.5)
Neutro Abs: 4218 cells/uL (ref 1500–7800)
Platelets: 239 10*3/uL (ref 140–400)
RBC: 5.16 10*6/uL — ABNORMAL HIGH (ref 3.80–5.10)
RDW: 13 % (ref 11.0–15.0)
Total Lymphocyte: 26.3 %
WBC: 6.4 10*3/uL (ref 3.8–10.8)

## 2023-04-07 LAB — HEMOGLOBIN A1C
Hgb A1c MFr Bld: 6.2 % of total Hgb — ABNORMAL HIGH (ref <5.7)
Mean Plasma Glucose: 131 mg/dL
eAG (mmol/L): 7.3 mmol/L

## 2023-04-07 LAB — TSH: TSH: 1.76 mIU/L (ref 0.40–4.50)

## 2023-04-15 ENCOUNTER — Other Ambulatory Visit: Payer: Self-pay

## 2023-04-15 ENCOUNTER — Ambulatory Visit: Payer: Medicare Other | Admitting: Orthopedic Surgery

## 2023-04-15 ENCOUNTER — Encounter: Payer: Self-pay | Admitting: Orthopedic Surgery

## 2023-04-15 DIAGNOSIS — M25561 Pain in right knee: Secondary | ICD-10-CM

## 2023-04-15 DIAGNOSIS — M1712 Unilateral primary osteoarthritis, left knee: Secondary | ICD-10-CM | POA: Diagnosis not present

## 2023-04-15 MED ORDER — BUPIVACAINE HCL 0.25 % IJ SOLN
4.0000 mL | INTRAMUSCULAR | Status: AC | PRN
Start: 2023-04-15 — End: 2023-04-15
  Administered 2023-04-15: 4 mL via INTRA_ARTICULAR

## 2023-04-15 MED ORDER — LIDOCAINE HCL 1 % IJ SOLN
5.0000 mL | INTRAMUSCULAR | Status: AC | PRN
Start: 2023-04-15 — End: 2023-04-15
  Administered 2023-04-15: 5 mL

## 2023-04-15 MED ORDER — METHYLPREDNISOLONE ACETATE 40 MG/ML IJ SUSP
40.0000 mg | INTRAMUSCULAR | Status: AC | PRN
Start: 2023-04-15 — End: 2023-04-15
  Administered 2023-04-15: 40 mg via INTRA_ARTICULAR

## 2023-04-15 NOTE — Progress Notes (Signed)
Office Visit Note   Patient: Nicole Padilla           Date of Birth: [birth year]           MRN: [id number] Visit Date: 04/15/2023 Requested by: Lucky Cowboy, MD [address] [place],  [place] [address] PCP: Lucky Cowboy, MD  Subjective: Chief Complaint  Patient presents with   Left Knee - Pain    HPI: Nicole Padilla is a 67 y.o. female who presents to the office reporting left knee pain.  Prior left knee cortisone injection 01/12/2023 gave her only marginal relief.  She is back on Mobic.  This is her season where she is less busy.  She did hit the left knee on a trailer hitch..                ROS: All systems reviewed are negative as they relate to the chief complaint within the history of present illness.  Patient denies fevers or chills.  Assessment & Plan: Visit Diagnoses:  1. Right knee pain, unspecified chronicity     Plan: Impression is left knee arthritis.  Cortisone injection performed today.  Gel injections have not really helped her in the past.  Would want to push out any further cortisone injections this year as far as possible.  No effusion today which is a good sign.  Follow-up as needed  Follow-Up Instructions: No follow-ups on file.   Orders:  No orders of the defined types were placed in this encounter.  No orders of the defined types were placed in this encounter.     Procedures: Large Joint Inj: L knee on 04/15/2023 11:12 AM Indications: diagnostic evaluation, joint swelling and pain Details: 18 G 1.5 in needle, superolateral approach  Arthrogram: No  Medications: 5 mL lidocaine 1 %; 40 mg methylPREDNISolone acetate 40 MG/ML; 4 mL bupivacaine 0.25 % Outcome: tolerated well, no immediate complications Procedure, treatment alternatives, risks and benefits explained, specific risks discussed. Consent was given by the patient. Immediately prior to procedure a time out was called to verify the correct patient, procedure, equipment,  support staff and site/side marked as required. Patient was prepped and draped in the usual sterile fashion.       Clinical Data: No additional findings.  Objective: Vital Signs: There were no vitals taken for this visit.  Physical Exam:  Constitutional: Patient appears well-developed HEENT:  Head: Normocephalic Eyes:EOM are normal Neck: Normal range of motion Cardiovascular: Normal rate Pulmonary/chest: Effort normal Neurologic: Patient is alert Skin: Skin is warm Psychiatric: Patient has normal mood and affect  Ortho Exam: Ortho exam demonstrates full extension and excellent flexion of the left knee.  Does have some mild patellofemoral crepitus.  Collateral and cruciate ligaments are stable.  Venous varicosities are present but negative Homans and no calf tenderness.  No effusion in the knee.  Specialty Comments:  No specialty comments available.  Imaging: No results found.   PMFS History: Patient Active Problem List   Diagnosis Date Noted   COVID-19 11/04/2021   Rotator cuff impingement syndrome of left shoulder 06/18/2021   B12 deficiency 06/03/2021   Iron deficiency 06/03/2021   Morbid obesity (HCC) 07/17/2018   Other abnormal glucose (prediabetes) 12/31/2014   Essential hypertension, benign 12/31/2014   Medication management 12/31/2014   Vitamin D deficiency 12/31/2014   Mixed hyperlipidemia 11/25/2013   Adjustment disorder with mixed anxiety and depressed mood 11/25/2013   Fibromyalgia    H/O gastric bypass    Past Medical  History:  Diagnosis Date   Abdominal wall abscess    Adjustment disorder with mixed anxiety and depressed mood 11/25/2013   Fibromyalgia    H/O gastric bypass    Mixed hyperlipidemia 11/25/2013    Family History  Problem Relation Age of Onset   Osteoporosis Mother    Emphysema Mother    Alzheimer's disease Father    Heart disease Father    Multiple sclerosis Sister     Past Surgical History:  Procedure Laterality Date    APPENDECTOMY     EXPLORATORY LAPAROTOMY  2006   post bypass, had intestinal preforation   GASTRIC BYPASS  03/02/2005   intestinal perforation     salivary stones removed     TONSILLECTOMY     WISDOM TOOTH EXTRACTION     Social History   Occupational History   Not on file  Tobacco Use   Smoking status: Never   Smokeless tobacco: Never  Vaping Use   Vaping Use: Never used  Substance and Sexual Activity   Alcohol use: Yes    Comment: rarely   Drug use: Never   Sexual activity: Not Currently    Partners: Female    Birth control/protection: Post-menopausal

## 2023-05-10 ENCOUNTER — Ambulatory Visit: Payer: Medicare Other | Admitting: Nurse Practitioner

## 2023-05-24 ENCOUNTER — Other Ambulatory Visit: Payer: Self-pay | Admitting: Nurse Practitioner

## 2023-05-24 DIAGNOSIS — F4323 Adjustment disorder with mixed anxiety and depressed mood: Secondary | ICD-10-CM

## 2023-05-30 NOTE — Progress Notes (Unsigned)
Assessment and Plan:  There are no diagnoses linked to this encounter.    Further disposition pending results of labs. Discussed med's effects and SE's.   Over 30 minutes of exam, counseling, chart review, and critical decision making was performed.   Future Appointments  Date Time Provider Department Center  05/31/2023 10:45 AM Raynelle Dick, NP [facility] None  07/08/2023 11:00 AM Raynelle Dick, NP [facility] None  08/17/2023 10:45 AM August Saucer Corrie Mckusick, MD [facility] None  10/03/2023 10:00 AM Raynelle Dick, NP [facility] None    ------------------------------------------------------------------------------------------------------------------   HPI There were no vitals taken for this visit. 67 y.o.female presents for  Past Medical History:  Diagnosis Date   Abdominal wall abscess    Adjustment disorder with mixed anxiety and depressed mood 11/25/2013   Fibromyalgia    H/O gastric bypass    Mixed hyperlipidemia 11/25/2013     Allergies  Allergen Reactions   E-Mycin [Erythromycin]     GI upset    Current Outpatient Medications on File Prior to Visit  Medication Sig   busPIRone (BUSPAR) 10 MG tablet Take 1 tablet (10 mg total) by mouth 3 (three) times daily as needed. for anxiety   Calcium Carbonate-Vit D-Min (CALCIUM 1200 PO) Take by mouth daily.   cetirizine (ZYRTEC) 10 MG tablet Take 10 mg by mouth daily.   fluticasone (FLONASE) 50 MCG/ACT nasal spray Place 1 spray into both nostrils daily.   meloxicam (MOBIC) 15 MG tablet Take 1 tablet (15 mg total) by mouth daily.   Multiple Vitamins-Minerals (MULTIVITAMIN WITH MINERALS) tablet Take 1 tablet by mouth daily.   olmesartan (BENICAR) 20 MG tablet Take 1 tablet (20 mg total) by mouth daily.   phentermine (ADIPEX-P) 37.5 MG tablet Take 1/2 to 1 tablet every morning for dieting & weightloss   No current facility-administered medications on file prior to visit.    ROS: all negative except above.   Physical  Exam:  There were no vitals taken for this visit.  General Appearance: Well nourished, in no apparent distress. Eyes: PERRLA, EOMs, conjunctiva no swelling or erythema Sinuses: No Frontal/maxillary tenderness ENT/Mouth: Ext aud canals clear, TMs without erythema, bulging. No erythema, swelling, or exudate on post pharynx.  Tonsils not swollen or erythematous. Hearing normal.  Neck: Supple, thyroid normal.  Respiratory: Respiratory effort normal, BS equal bilaterally without rales, rhonchi, wheezing or stridor.  Cardio: RRR with no MRGs. Brisk peripheral pulses without edema.  Abdomen: Soft, + BS.  Non tender, no guarding, rebound, hernias, masses. Lymphatics: Non tender without lymphadenopathy.  Musculoskeletal: Full ROM, 5/5 strength, normal gait.  Skin: Warm, dry without rashes, lesions, ecchymosis.  Neuro: Cranial nerves intact. Normal muscle tone, no cerebellar symptoms. Sensation intact.  Psych: Awake and oriented X 3, normal affect, Insight and Judgment appropriate.     Raynelle Dick, NP 12:31 PM [facility]

## 2023-05-31 ENCOUNTER — Ambulatory Visit: Payer: Medicare Other | Admitting: Nurse Practitioner

## 2023-05-31 ENCOUNTER — Encounter: Payer: Self-pay | Admitting: Nurse Practitioner

## 2023-05-31 ENCOUNTER — Other Ambulatory Visit: Payer: Self-pay | Admitting: Nurse Practitioner

## 2023-05-31 DIAGNOSIS — H6693 Otitis media, unspecified, bilateral: Secondary | ICD-10-CM

## 2023-05-31 DIAGNOSIS — F321 Major depressive disorder, single episode, moderate: Secondary | ICD-10-CM

## 2023-05-31 DIAGNOSIS — R7309 Other abnormal glucose: Secondary | ICD-10-CM

## 2023-05-31 DIAGNOSIS — B9689 Other specified bacterial agents as the cause of diseases classified elsewhere: Secondary | ICD-10-CM

## 2023-05-31 DIAGNOSIS — I10 Essential (primary) hypertension: Secondary | ICD-10-CM

## 2023-05-31 MED ORDER — AMOXICILLIN 500 MG PO TABS
500.0000 mg | ORAL_TABLET | Freq: Three times a day (TID) | ORAL | 0 refills | Status: AC
Start: 2023-05-31 — End: 2023-06-07

## 2023-05-31 MED ORDER — BUPROPION HCL ER (XL) 150 MG PO TB24
150.0000 mg | ORAL_TABLET | ORAL | 2 refills | Status: DC
Start: 2023-05-31 — End: 2023-05-31

## 2023-05-31 MED ORDER — SEMAGLUTIDE(0.25 OR 0.5MG/DOS) 2 MG/3ML ~~LOC~~ SOPN
0.2500 mg | PEN_INJECTOR | SUBCUTANEOUS | 2 refills | Status: DC
Start: 2023-05-31 — End: 2023-06-13

## 2023-05-31 MED ORDER — METFORMIN HCL ER 500 MG PO TB24
500.0000 mg | ORAL_TABLET | Freq: Two times a day (BID) | ORAL | 3 refills | Status: DC
Start: 2023-05-31 — End: 2023-10-17

## 2023-05-31 MED ORDER — PHENTERMINE HCL 37.5 MG PO TABS
ORAL_TABLET | ORAL | 0 refills | Status: DC
Start: 2023-05-31 — End: 2023-07-05

## 2023-05-31 MED ORDER — BUPROPION HCL ER (XL) 150 MG PO TB24
150.0000 mg | ORAL_TABLET | ORAL | 2 refills | Status: DC
Start: 2023-05-31 — End: 2023-09-26

## 2023-05-31 NOTE — Patient Instructions (Addendum)
Phentermine-Take 1 tab in am and 1/2 tab at lunch  Hopefully Ozempic approved- start at 0.25 mg once a week- increase to 0.5 if not feeling effects of medicine.   Continue Buspar 10 mg three times a day  Start Buproprion 150 mg 1 cap daily  Phentermine Capsules or Tablets What is this medication? PHENTERMINE (FEN ter meen) promotes weight loss. It works by decreasing appetite. It is often used for a short period of time. Changes to diet and exercise are often combined with this medication. This medicine may be used for other purposes; ask your health care provider or pharmacist if you have questions. COMMON BRAND NAME(S): Adipex-P, Atti-Plex P, Atti-Plex P Spansule, Fastin, Lomaira, Pro-Fast, Pro-Fast HS, Pro-Fast SA, Tara-8 What should I tell my care team before I take this medication? They need to know if you have any of these conditions: Agitation or nervousness Diabetes Glaucoma Heart disease High blood pressure History of substance use disorder History of stroke Kidney disease Lung disease called Primary Pulmonary Hypertension (PPH) Taken an MAOI, such as Carbex, Eldepryl, Marplan, Nardil, or Parnate in last 14 days Taking stimulant medications for attention disorders, weight loss, or to stay awake Thyroid disease An unusual or allergic reaction to phentermine, other medications, foods, dyes, or preservatives Pregnant or trying to get pregnant Breastfeeding How should I use this medication? Take this medication by mouth with a glass of water. Follow the directions on the prescription label. Take your medication at regular intervals. Do not take it more often than directed. Do not stop taking except on your care team's advice. Talk to your care team about the use of this medication in children. While this medication may be prescribed for children 17 years or older for selected conditions, precautions do apply. Overdosage: If you think you have taken too much of this medicine  contact a poison control center or emergency room at once. NOTE: This medicine is only for you. Do not share this medicine with others. What if I miss a dose? If you miss a dose, take it as soon as you can. If it is almost time for your next dose, take only that dose. Do not take double or extra doses. What may interact with this medication? Do not take this medication with any of the following: MAOIs, such as Carbex, Eldepryl, Marplan, Nardil, and Parnate This medication may also interact with the following: Alcohol Certain medications for depression, anxiety, or other mental health conditions Certain medications for blood pressure Linezolid Medications for colds or breathing difficulties, such as pseudoephedrine or phenylephrine Medications for diabetes Sibutramine Stimulant medications for ADHD, weight loss, or staying awake This list may not describe all possible interactions. Give your health care provider a list of all the medicines, herbs, non-prescription drugs, or dietary supplements you use. Also tell them if you smoke, drink alcohol, or use illegal drugs. Some items may interact with your medicine. What should I watch for while using this medication? Visit your care team for regular checks on your progress. Do not stop taking except on your care team's advice. You may develop a severe reaction. Your care team will tell you how much medication to take. Do not take this medication close to bedtime. It may prevent you from sleeping. This medication may affect your coordination, reaction time, or judgment. Do not drive or operate machinery until you know how this medication affects you. Sit up or stand slowly to reduce the risk of dizzy or fainting spells. Drinking alcohol with this  medication can increase the risk of these side effects. This medication may affect blood sugar levels. Ask your care team if changes in diet or medications are needed if you have diabetes. Inform your care  team if you wish to become pregnant or think you might be pregnant. Losing weight while pregnant is not advised and may cause harm to the unborn child. Talk to your care team for more information. What side effects may I notice from receiving this medication? Side effects that you should report to your care team as soon as possible: Allergic reactions--skin rash, itching, hives, swelling of the face, lips, tongue, or throat Heart valve disease--shortness of breath, chest pain, unusual weakness or fatigue, dizziness, feeling faint or lightheaded, fever, sudden weight gain, fast or irregular heartbeat Pulmonary hypertension--shortness of breath, chest pain, fast or irregular heartbeat, feeling faint or lightheaded, fatigue, swelling of the ankles or feet Side effects that usually do not require medical attention (report to your care team if they continue or are bothersome): Change in taste Diarrhea Dizziness Dry mouth Restlessness Trouble sleeping This list may not describe all possible side effects. Call your doctor for medical advice about side effects. You may report side effects to FDA at 1-800-FDA-1088. Where should I keep my medication? Keep out of the reach of children. This medication can be abused. Keep your medication in a safe place to protect it from theft. Do not share this medication with anyone. Selling or giving away this medication is dangerous and against the law. This medication may cause harm and death if it is taken by other adults, children, or pets. Return medication that has not been used to an official disposal site. Contact the DEA at 510-396-2364 or your city/county government to find a site. If you cannot return the medication, mix any unused medication with a substance like cat litter or coffee grounds. Then throw the medication away in a sealed container like a sealed bag or coffee can with a lid. Do not use the medication after the expiration date. Store at room  temperature between 20 and 25 degrees C (68 and 77 degrees F). Keep container tightly closed. NOTE: This sheet is a summary. It may not cover all possible information. If you have questions about this medicine, talk to your doctor, pharmacist, or health care provider.  2024 Elsevier/Gold Standard (2022-04-14 00:00:00)

## 2023-06-13 ENCOUNTER — Encounter: Payer: Self-pay | Admitting: Nurse Practitioner

## 2023-06-13 ENCOUNTER — Ambulatory Visit: Payer: Medicare Other | Admitting: Nurse Practitioner

## 2023-06-13 VITALS — Ht 62.0 in

## 2023-06-13 DIAGNOSIS — U071 COVID-19: Secondary | ICD-10-CM

## 2023-06-13 MED ORDER — DEXAMETHASONE 1 MG PO TABS: ORAL_TABLET | ORAL | 0 refills | Status: DC

## 2023-06-13 MED ORDER — PROMETHAZINE-DM 6.25-15 MG/5ML PO SYRP: 5.0000 mL | ORAL_SOLUTION | Freq: Four times a day (QID) | ORAL | 1 refills | Status: DC | PRN

## 2023-06-13 MED ORDER — AZITHROMYCIN 250 MG PO TABS
ORAL_TABLET | ORAL | 1 refills | Status: DC
Start: 2023-06-13 — End: 2023-07-05

## 2023-06-13 NOTE — Progress Notes (Signed)
THIS ENCOUNTER IS A VIRTUAL VISIT DUE TO COVID-19 - PATIENT WAS NOT SEEN IN THE OFFICE.  PATIENT HAS CONSENTED TO VIRTUAL VISIT / TELEMEDICINE VISIT   Virtual Visit via telephone Note  I connected with  Nicole Padilla on 06/13/2023 by telephone.  I verified that I am speaking with the correct person using two identifiers.    I discussed the limitations of evaluation and management by telemedicine and the availability of in person appointments. The patient expressed understanding and agreed to proceed.  History of Present Illness:  Ht 5\' 2"  (1.575 m)   BMI 45.84 kg/m  67 y.o. patient contacted office reporting URI sx . she tested positive by home test today. OV was conducted by telephone to minimize exposure. This patient was vaccinated for covid 19, last 10/03/2020    Sx began 2 days ago with productive cough of yellow mucus, sore throat, congestion, headaches, body aches, chills  Treatments tried so far: Advil  Exposures: At an event   Medications  Current Outpatient Medications (Endocrine & Metabolic):    metFORMIN (GLUCOPHAGE-XR) 500 MG 24 hr tablet, Take 1 tablet (500 mg total) by mouth 2 (two) times daily with a meal.   Semaglutide,0.25 or 0.5MG /DOS, 2 MG/3ML SOPN, Inject 0.25 mg into the skin once a week.  Current Outpatient Medications (Cardiovascular):    olmesartan (BENICAR) 20 MG tablet, Take 1 tablet (20 mg total) by mouth daily.  Current Outpatient Medications (Respiratory):    cetirizine (ZYRTEC) 10 MG tablet, Take 10 mg by mouth daily.   fluticasone (FLONASE) 50 MCG/ACT nasal spray, Place 1 spray into both nostrils daily.  Current Outpatient Medications (Analgesics):    meloxicam (MOBIC) 15 MG tablet, Take 1 tablet (15 mg total) by mouth daily.   Current Outpatient Medications (Other):    buPROPion (WELLBUTRIN XL) 150 MG 24 hr tablet, Take 1 tablet (150 mg total) by mouth every morning.   busPIRone (BUSPAR) 10 MG tablet, Take 1 tablet (10 mg total) by mouth  3 (three) times daily as needed. for anxiety   Calcium Carbonate-Vit D-Min (CALCIUM 1200 PO), Take by mouth daily.   Multiple Vitamins-Minerals (MULTIVITAMIN WITH MINERALS) tablet, Take 1 tablet by mouth daily.   phentermine (ADIPEX-P) 37.5 MG tablet, TAKE 1 TAB EVERY AM AND 1/2 TAB AT LUNCH  Allergies:  Allergies  Allergen Reactions   E-Mycin [Erythromycin]     GI upset    Problem list She has Fibromyalgia; H/O gastric bypass; Mixed hyperlipidemia; Adjustment disorder with mixed anxiety and depressed mood; Other abnormal glucose (prediabetes); Essential hypertension, benign; Medication management; Vitamin D deficiency; Morbid obesity (HCC); B12 deficiency; Iron deficiency; Rotator cuff impingement syndrome of left shoulder; and COVID-19 on their problem list.   Social History:   reports that she has never smoked. She has never used smokeless tobacco. She reports current alcohol use. She reports that she does not use drugs.  Observations/Objective:  General : Well sounding patient in no apparent distress HEENT: no hoarseness, no cough for duration of visit Lungs: speaks in complete sentences, no audible wheezing, no apparent distress Neurological: alert, oriented x 3 Psychiatric: pleasant, judgement appropriate   Assessment and Plan:  Covid 19 Covid 19 positive per rapid screening test at home today Risk factors include: morbid obesity Symptoms are: mild Immue support reviewed Vit C, Vit D , Zinc Take tylenol PRN temp 101+ Push hydration Regular ambulation or calf exercises exercises for clot prevention and 81 mg ASA unless contraindicated Sx supportive therapy suggested Follow up via  mychart or telephone if needed Advised patient obtain O2 monitor; present to ED if persistently <90% or with severe dyspnea, CP, fever uncontrolled by tylenol, confusion, sudden decline       Should remain in isolation 5 days from testing positive and then wear a mask when around other people for  the following 5 days -     dexamethasone (DECADRON) 1 MG tablet; Take 3 tabs for 3 days, 2 tabs for 3 days 1 tab for 5 days. Take with food. -     azithromycin (ZITHROMAX) 250 MG tablet; Take 2 tablets (500 mg) on  Day 1,  followed by 1 tablet (250 mg) once daily on Days 2 through 5. -     promethazine-dextromethorphan (PROMETHAZINE-DM) 6.25-15 MG/5ML syrup; Take 5 mLs by mouth 4 (four) times daily as needed for cough.    Follow Up Instructions:  I discussed the assessment and treatment plan with the patient. The patient was provided an opportunity to ask questions and all were answered. The patient agreed with the plan and demonstrated an understanding of the instructions.   The patient was advised to call back or seek an in-person evaluation if the symptoms worsen or if the condition fails to improve as anticipated.  I provided 15 minutes of non-face-to-face time during this encounter.   Raynelle Dick, NP

## 2023-06-13 NOTE — Telephone Encounter (Signed)
Please call and schedule a phone visit

## 2023-06-13 NOTE — Patient Instructions (Signed)
COVID  Immue support reviewed Vit C, Vit D , Zinc Take tylenol PRN temp 101+ Push hydration Regular ambulation or calf exercises exercises for clot prevention and 81 mg ASA unless contraindicated Sx supportive therapy suggested Follow up via mychart or telephone if needed Advised patient obtain O2 monitor; present to ED if persistently <90% or with severe dyspnea, CP, fever uncontrolled by tylenol, confusion, sudden decline       Should remain in isolation 5 days from testing positive and then wear a mask when around other people for the following 5 days -     dexamethasone (DECADRON) 1 MG tablet; Take 3 tabs for 3 days, 2 tabs for 3 days 1 tab for 5 days. Take with food. -     azithromycin (ZITHROMAX) 250 MG tablet; Take 2 tablets (500 mg) on  Day 1,  followed by 1 tablet (250 mg) once daily on Days 2 through 5. -     promethazine-dextromethorphan (PROMETHAZINE-DM) 6.25-15 MG/5ML syrup; Take 5 mLs by mouth 4 (four) times daily as needed for cough.

## 2023-07-05 ENCOUNTER — Ambulatory Visit (INDEPENDENT_AMBULATORY_CARE_PROVIDER_SITE_OTHER): Payer: Medicare Other | Admitting: Nurse Practitioner

## 2023-07-05 ENCOUNTER — Encounter: Payer: Self-pay | Admitting: Nurse Practitioner

## 2023-07-05 DIAGNOSIS — E782 Mixed hyperlipidemia: Secondary | ICD-10-CM | POA: Diagnosis not present

## 2023-07-05 DIAGNOSIS — F321 Major depressive disorder, single episode, moderate: Secondary | ICD-10-CM

## 2023-07-05 DIAGNOSIS — R7309 Other abnormal glucose: Secondary | ICD-10-CM | POA: Diagnosis not present

## 2023-07-05 DIAGNOSIS — L819 Disorder of pigmentation, unspecified: Secondary | ICD-10-CM

## 2023-07-05 DIAGNOSIS — I10 Essential (primary) hypertension: Secondary | ICD-10-CM | POA: Diagnosis not present

## 2023-07-05 MED ORDER — PHENTERMINE HCL 37.5 MG PO TABS
ORAL_TABLET | ORAL | 0 refills | Status: DC
Start: 2023-07-05 — End: 2023-10-17

## 2023-07-05 NOTE — Progress Notes (Signed)
Assessment and Plan:  Nicole Padilla was seen today for medication management.  Diagnoses and all orders for this visit:  Morbid obesity (HCC) She has lost 17 pounds in the past month   Continue Phentermine 37.5 mg every 1 tab in am and 1/2 in afternoon Fair life protein shakes Eat more frequently - try not to go more than 6 hours without protein Aim for 90 grams of protein a day- 30 breakfast/30 lunch 30 dinner Try to keep net carbs less than 50 Net Carbs=Total Carbs-fiber- sugar alcohols Exercise heartrate 120-140(fat burning zone)- walking 20-30 minutes 4 days a week  Follow up in December for physical   Essential hypertension, benign - continue medications- Benicar 20 mg every day , DASH diet, exercise and monitor at home. Call if greater than 130/80.  - CBC - CMP Will return in 3 days for labs  Abnormal glucose Continue diet and exercise.       - A1c will return in 3 days for labs  Current moderate episode of major depressive disorder, unspecified whether recurrent (HCC) Symptoms are currently well controlled Continue Buspar for anxiety Continue diet and exercise Practice good sleep hygiene Continue Wellbutrin 150 mg every day   Pigmented skin lesion of head - Dermatology appointment in 3 days   Mixed hyperlipidemia check lipids- if LDL remains elevated but only slightly would like to continue plant based diet and increasing activity, if greater than 130 will recommend low dose of Rosuvastatin decrease fatty foods increase activity.  -     Lipid panel will return in 3 days to have checked   Further disposition pending results of labs. Discussed med's effects and SE's.   Over 30 minutes of exam, counseling, chart review, and critical decision making was performed.   Future Appointments  Date Time Provider Department Center  08/17/2023 10:45 AM Cammy Copa, MD [facility] None  10/03/2023 10:00 AM Raynelle Dick, NP [facility] None     ------------------------------------------------------------------------------------------------------------------   HPI BP 110/64   Pulse 92   Temp 97.7 F (36.5 C)   Ht 5\' 2"  (1.575 m)   Wt 233 lb 9.6 oz (106 kg)   SpO2 94%   BMI 42.73 kg/m  67 y.o.female presents for evaluation of medication, hypertension, morbid obesity  Her mood is much improved with Wellbutrin.   BP well controlled with Olmesartan 20 mg every day . BP has been running 128/74 at home- elevated in office because very upset and crying BP Readings from Last 3 Encounters:  07/05/23 110/64  05/31/23 (!) 146/72  04/06/23 128/78  Denies headaches, chest pain, shortness of breath and dizziness   BMI is Body mass index is 42.73 kg/m., she has been working on diet and exercise. Currently on Phentermine 37.5 mg 1 tab in am and 1/2 at lunch.   Wt Readings from Last 3 Encounters:  07/05/23 233 lb 9.6 oz (106 kg)  05/31/23 250 lb 9.6 oz (113.7 kg)  04/06/23 255 lb 6.4 oz (115.8 kg)   LPA was high at 198 at study facility that she is in, not on cholesterol medication.  Lab Results  Component Value Date   CHOL 215 (H) 04/06/2023   HDL 68 04/06/2023   LDLCALC 128 (H) 04/06/2023   TRIG 88 04/06/2023   CHOLHDL 3.2 04/06/2023    She has abnormal glucose and has tried to control with diet and exercise.  Has tried to lose weight on her own with Phentermine but not successful.  She was not approved for  semaglutide Lab Results  Component Value Date   HGBA1C 6.2 (H) 04/06/2023    Working on diet and exercise Lab Results  Component Value Date   CHOL 215 (H) 04/06/2023   HDL 68 04/06/2023   LDLCALC 128 (H) 04/06/2023   TRIG 88 04/06/2023   CHOLHDL 3.2 04/06/2023    Past Medical History:  Diagnosis Date   Abdominal wall abscess    Adjustment disorder with mixed anxiety and depressed mood 11/25/2013   Fibromyalgia    H/O gastric bypass    Mixed hyperlipidemia 11/25/2013     Allergies  Allergen Reactions    E-Mycin [Erythromycin]     GI upset    Current Outpatient Medications on File Prior to Visit  Medication Sig   buPROPion (WELLBUTRIN XL) 150 MG 24 hr tablet Take 1 tablet (150 mg total) by mouth every morning.   busPIRone (BUSPAR) 10 MG tablet Take 1 tablet (10 mg total) by mouth 3 (three) times daily as needed. for anxiety   Calcium Carbonate-Vit D-Min (CALCIUM 1200 PO) Take by mouth daily.   cetirizine (ZYRTEC) 10 MG tablet Take 10 mg by mouth daily.   fluticasone (FLONASE) 50 MCG/ACT nasal spray Place 1 spray into both nostrils daily.   Ibuprofen (ADVIL PO) Take by mouth.   meloxicam (MOBIC) 15 MG tablet Take 1 tablet (15 mg total) by mouth daily.   metFORMIN (GLUCOPHAGE-XR) 500 MG 24 hr tablet Take 1 tablet (500 mg total) by mouth 2 (two) times daily with a meal.   Multiple Vitamins-Minerals (MULTIVITAMIN WITH MINERALS) tablet Take 1 tablet by mouth daily.   olmesartan (BENICAR) 20 MG tablet Take 1 tablet (20 mg total) by mouth daily.   phentermine (ADIPEX-P) 37.5 MG tablet TAKE 1 TAB EVERY AM AND 1/2 TAB AT LUNCH   azithromycin (ZITHROMAX) 250 MG tablet Take 2 tablets (500 mg) on  Day 1,  followed by 1 tablet (250 mg) once daily on Days 2 through 5. (Patient not taking: Reported on 07/05/2023)   dexamethasone (DECADRON) 1 MG tablet Take 3 tabs for 3 days, 2 tabs for 3 days 1 tab for 5 days. Take with food. (Patient not taking: Reported on 07/05/2023)   promethazine-dextromethorphan (PROMETHAZINE-DM) 6.25-15 MG/5ML syrup Take 5 mLs by mouth 4 (four) times daily as needed for cough. (Patient not taking: Reported on 07/05/2023)   No current facility-administered medications on file prior to visit.    ROS: all negative except above.   Physical Exam:  BP 110/64   Pulse 92   Temp 97.7 F (36.5 C)   Ht 5\' 2"  (1.575 m)   Wt 233 lb 9.6 oz (106 kg)   SpO2 94%   BMI 42.73 kg/m   General Appearance:Pleasant well adjusted morbidly obese female Eyes: PERRLA, EOMs, conjunctiva no swelling  or erythema Sinuses: No Frontal/maxillary tenderness ENT/Mouth: Ext aud canals clear, TM with erythema and bulging bilaterally. Marland Kitchen No erythema, swelling, or exudate on post pharynx.   Hearing normal.  Neck: Supple, thyroid normal.  Respiratory: Respiratory effort normal, BS equal bilaterally without rales, rhonchi, wheezing or stridor.  Cardio: RRR with no MRGs. Brisk peripheral pulses without edema.  Abdomen: Soft, + BS.  Non tender, no guarding, rebound, hernias, masses. Lymphatics: Non tender without lymphadenopathy.  Musculoskeletal: Full ROM, 5/5 strength, normal gait.  Skin: Warm, dry .  She has scaly/keratotic round lesion approx 2cmX1cm to left temple now 2 different colors  Neuro: Cranial nerves intact. Normal muscle tone, no cerebellar symptoms. Sensation intact.  Psych: Awake  and oriented X 3, normal affect. Insight and Judgment appropriate.     Raynelle Dick, NP 4:00 PM [facility]

## 2023-07-05 NOTE — Patient Instructions (Signed)
Fair life protein shakes Eat more frequently - try not to go more than 6 hours without protein Aim for 90 grams of protein a day- 30 breakfast/30 lunch 30 dinner Try to keep net carbs less than 50 Net Carbs=Total Carbs-fiber- sugar alcohols Exercise heartrate 120-140(fat burning zone)- walking 20-30 minutes 4 days a week

## 2023-07-08 ENCOUNTER — Ambulatory Visit: Payer: Medicare Other | Admitting: Nurse Practitioner

## 2023-07-08 ENCOUNTER — Other Ambulatory Visit: Payer: Medicare Other

## 2023-07-08 DIAGNOSIS — C44329 Squamous cell carcinoma of skin of other parts of face: Secondary | ICD-10-CM | POA: Diagnosis not present

## 2023-07-08 DIAGNOSIS — E782 Mixed hyperlipidemia: Secondary | ICD-10-CM

## 2023-07-08 DIAGNOSIS — D485 Neoplasm of uncertain behavior of skin: Secondary | ICD-10-CM | POA: Diagnosis not present

## 2023-07-08 DIAGNOSIS — I10 Essential (primary) hypertension: Secondary | ICD-10-CM | POA: Diagnosis not present

## 2023-07-08 DIAGNOSIS — R7309 Other abnormal glucose: Secondary | ICD-10-CM | POA: Diagnosis not present

## 2023-07-08 DIAGNOSIS — D1801 Hemangioma of skin and subcutaneous tissue: Secondary | ICD-10-CM | POA: Diagnosis not present

## 2023-07-09 ENCOUNTER — Encounter: Payer: Self-pay | Admitting: Nurse Practitioner

## 2023-07-09 LAB — COMPLETE METABOLIC PANEL WITH GFR
AG Ratio: 1.6 (calc) (ref 1.0–2.5)
ALT: 13 U/L (ref 6–29)
AST: 13 U/L (ref 10–35)
Albumin: 3.9 g/dL (ref 3.6–5.1)
Alkaline phosphatase (APISO): 69 U/L (ref 37–153)
BUN: 17 mg/dL (ref 7–25)
CO2: 26 mmol/L (ref 20–32)
Calcium: 9.4 mg/dL (ref 8.6–10.4)
Chloride: 106 mmol/L (ref 98–110)
Creat: 0.65 mg/dL (ref 0.50–1.05)
Globulin: 2.5 g/dL (calc) (ref 1.9–3.7)
Glucose, Bld: 83 mg/dL (ref 65–99)
Potassium: 4.6 mmol/L (ref 3.5–5.3)
Sodium: 141 mmol/L (ref 135–146)
Total Bilirubin: 0.4 mg/dL (ref 0.2–1.2)
Total Protein: 6.4 g/dL (ref 6.1–8.1)
eGFR: 96 mL/min/{1.73_m2} (ref >=60)

## 2023-07-09 LAB — CBC WITH DIFFERENTIAL/PLATELET
Absolute Monocytes: 420 cells/uL (ref 200–950)
Basophils Absolute: 28 cells/uL (ref 0–200)
Basophils Relative: 0.4 %
Eosinophils Absolute: 63 cells/uL (ref 15–500)
Eosinophils Relative: 0.9 %
HCT: 42.4 % (ref 35.0–45.0)
Hemoglobin: 13.5 g/dL (ref 11.7–15.5)
Lymphs Abs: 1771 cells/uL (ref 850–3900)
MCH: 28.1 pg (ref 27.0–33.0)
MCHC: 31.8 g/dL — ABNORMAL LOW (ref 32.0–36.0)
MCV: 88.1 fL (ref 80.0–100.0)
MPV: 12.6 fL — ABNORMAL HIGH (ref 7.5–12.5)
Monocytes Relative: 6 %
Neutro Abs: 4718 cells/uL (ref 1500–7800)
Neutrophils Relative %: 67.4 %
Platelets: 222 10*3/uL (ref 140–400)
RBC: 4.81 10*6/uL (ref 3.80–5.10)
RDW: 13.6 % (ref 11.0–15.0)
Total Lymphocyte: 25.3 %
WBC: 7 10*3/uL (ref 3.8–10.8)

## 2023-07-09 LAB — LIPID PANEL
Cholesterol: 174 mg/dL (ref <200)
HDL: 53 mg/dL (ref >=50)
LDL Cholesterol (Calc): 106 mg/dL (calc) — ABNORMAL HIGH
Non-HDL Cholesterol (Calc): 121 mg/dL (calc) (ref <130)
Total CHOL/HDL Ratio: 3.3 (calc) (ref <5.0)
Triglycerides: 66 mg/dL (ref <150)

## 2023-07-09 LAB — HEMOGLOBIN A1C W/OUT EAG: Hgb A1c MFr Bld: 5.8 % of total Hgb — ABNORMAL HIGH (ref <5.7)

## 2023-07-11 ENCOUNTER — Other Ambulatory Visit: Payer: Self-pay | Admitting: Nurse Practitioner

## 2023-07-11 DIAGNOSIS — F4323 Adjustment disorder with mixed anxiety and depressed mood: Secondary | ICD-10-CM

## 2023-07-11 DIAGNOSIS — E782 Mixed hyperlipidemia: Secondary | ICD-10-CM

## 2023-07-11 MED ORDER — ROSUVASTATIN CALCIUM 5 MG PO TABS
ORAL_TABLET | ORAL | 2 refills | Status: DC
Start: 2023-07-11 — End: 2024-02-22

## 2023-07-11 MED ORDER — BUSPIRONE HCL 10 MG PO TABS
10.0000 mg | ORAL_TABLET | Freq: Three times a day (TID) | ORAL | 0 refills | Status: DC | PRN
Start: 2023-07-11 — End: 2023-08-30

## 2023-07-21 ENCOUNTER — Encounter: Payer: Medicare Other | Admitting: Nurse Practitioner

## 2023-08-15 DIAGNOSIS — L821 Other seborrheic keratosis: Secondary | ICD-10-CM | POA: Diagnosis not present

## 2023-08-15 DIAGNOSIS — D485 Neoplasm of uncertain behavior of skin: Secondary | ICD-10-CM | POA: Diagnosis not present

## 2023-08-15 DIAGNOSIS — L570 Actinic keratosis: Secondary | ICD-10-CM | POA: Diagnosis not present

## 2023-08-15 DIAGNOSIS — C44329 Squamous cell carcinoma of skin of other parts of face: Secondary | ICD-10-CM | POA: Diagnosis not present

## 2023-08-16 DIAGNOSIS — H35363 Drusen (degenerative) of macula, bilateral: Secondary | ICD-10-CM | POA: Diagnosis not present

## 2023-08-16 DIAGNOSIS — H40013 Open angle with borderline findings, low risk, bilateral: Secondary | ICD-10-CM | POA: Diagnosis not present

## 2023-08-16 DIAGNOSIS — H538 Other visual disturbances: Secondary | ICD-10-CM | POA: Diagnosis not present

## 2023-08-17 ENCOUNTER — Ambulatory Visit: Payer: Medicare Other | Admitting: Orthopedic Surgery

## 2023-08-17 ENCOUNTER — Encounter: Payer: Self-pay | Admitting: Orthopedic Surgery

## 2023-08-17 DIAGNOSIS — M1712 Unilateral primary osteoarthritis, left knee: Secondary | ICD-10-CM

## 2023-08-21 ENCOUNTER — Encounter: Payer: Self-pay | Admitting: Orthopedic Surgery

## 2023-08-21 NOTE — Progress Notes (Signed)
Office Visit Note   Patient: Nicole Padilla           Date of Birth: 1956/07/16           MRN: [id number] Visit Date: 08/17/2023 Requested by: Lucky Cowboy, MD [address] [place],  [place] [address] PCP: Lucky Cowboy, MD  Subjective: Chief Complaint  Patient presents with   Left Knee - Pain    HPI: Nicole Padilla is a 67 y.o. female who presents to the office reporting left knee pain.  Patient had injection with cortisone on 04/15/2023.  Overall she is "doing pretty good".  Starting using supplements and essential oils.  This is her busy time of year.  Occasionally she wakes up with pain but she is able to get back to sleep..                ROS: All systems reviewed are negative as they relate to the chief complaint within the history of present illness.  Patient denies fevers or chills.  Assessment & Plan: Visit Diagnoses:  1. Unilateral primary osteoarthritis, left knee     Plan: Impression is severe arthritis left knee which is currently relatively less symptomatic than it has been.  Decision for or against injection today based primarily on how she is doing clinically.  Right now I think would be better to wait until she is a little bit more symptomatic as the shots really do not prevent pain in the future but treat pain in the present.  She has full range of motion and no effusion today.  She will follow-up when she is a little bit more symptomatic and we could consider another aspiration and injection at that time  Follow-Up Instructions: No follow-ups on file.   Orders:  No orders of the defined types were placed in this encounter.  No orders of the defined types were placed in this encounter.     Procedures: No procedures performed   Clinical Data: No additional findings.  Objective: Vital Signs: There were no vitals taken for this visit.  Physical Exam:  Constitutional: Patient appears well-developed HEENT:  Head:  Normocephalic Eyes:EOM are normal Neck: Normal range of motion Cardiovascular: Normal rate Pulmonary/chest: Effort normal Neurologic: Patient is alert Skin: Skin is warm Psychiatric: Patient has normal mood and affect  Ortho Exam: Ortho exam demonstrates excellent range of motion of that left knee.  Extensor mechanism intact.  Varicosities present.  No calf tenderness negative Homans.  Patella mobility excellent.  Collateral cruciate ligaments are stable.  No masses lymphadenopathy or skin changes noted in that left knee region.  No groin pain with internal/external rotation of the leg.  Specialty Comments:  No specialty comments available.  Imaging: No results found.   PMFS History: Patient Active Problem List   Diagnosis Date Noted   COVID-19 11/04/2021   Rotator cuff impingement syndrome of left shoulder 06/18/2021   B12 deficiency 06/03/2021   Iron deficiency 06/03/2021   Morbid obesity (HCC) 07/17/2018   Other abnormal glucose (prediabetes) 12/31/2014   Essential hypertension, benign 12/31/2014   Medication management 12/31/2014   Vitamin D deficiency 12/31/2014   Mixed hyperlipidemia 11/25/2013   Adjustment disorder with mixed anxiety and depressed mood 11/25/2013   Fibromyalgia    H/O gastric bypass    Past Medical History:  Diagnosis Date   Abdominal wall abscess    Adjustment disorder with mixed anxiety and depressed mood 11/25/2013   Fibromyalgia    H/O gastric bypass  Mixed hyperlipidemia 11/25/2013    Family History  Problem Relation Age of Onset   Osteoporosis Mother    Emphysema Mother    Alzheimer's disease Father    Heart disease Father    Multiple sclerosis Sister     Past Surgical History:  Procedure Laterality Date   APPENDECTOMY     EXPLORATORY LAPAROTOMY  2006   post bypass, had intestinal preforation   GASTRIC BYPASS  03/02/2005   intestinal perforation     salivary stones removed     TONSILLECTOMY     WISDOM TOOTH EXTRACTION      Social History   Occupational History   Not on file  Tobacco Use   Smoking status: Never   Smokeless tobacco: Never  Vaping Use   Vaping status: Never Used  Substance and Sexual Activity   Alcohol use: Yes    Comment: rarely   Drug use: Never   Sexual activity: Not Currently    Partners: Female    Birth control/protection: Post-menopausal

## 2023-08-30 ENCOUNTER — Other Ambulatory Visit: Payer: Self-pay | Admitting: Nurse Practitioner

## 2023-08-30 DIAGNOSIS — F4323 Adjustment disorder with mixed anxiety and depressed mood: Secondary | ICD-10-CM

## 2023-09-08 ENCOUNTER — Other Ambulatory Visit: Payer: Self-pay | Admitting: Nurse Practitioner

## 2023-09-17 ENCOUNTER — Other Ambulatory Visit: Payer: Self-pay | Admitting: Nurse Practitioner

## 2023-09-17 ENCOUNTER — Encounter: Payer: Self-pay | Admitting: Nurse Practitioner

## 2023-09-23 ENCOUNTER — Encounter: Payer: Medicare Other | Admitting: Nurse Practitioner

## 2023-09-26 ENCOUNTER — Encounter: Payer: Self-pay | Admitting: Nurse Practitioner

## 2023-09-26 ENCOUNTER — Other Ambulatory Visit: Payer: Self-pay | Admitting: Nurse Practitioner

## 2023-09-26 DIAGNOSIS — F4323 Adjustment disorder with mixed anxiety and depressed mood: Secondary | ICD-10-CM

## 2023-09-26 DIAGNOSIS — F321 Major depressive disorder, single episode, moderate: Secondary | ICD-10-CM

## 2023-09-26 MED ORDER — BUSPIRONE HCL 10 MG PO TABS
10.0000 mg | ORAL_TABLET | Freq: Three times a day (TID) | ORAL | 3 refills | Status: DC | PRN
Start: 2023-09-26 — End: 2023-09-26

## 2023-09-26 MED ORDER — BUPROPION HCL ER (XL) 150 MG PO TB24: 150.0000 mg | ORAL_TABLET | ORAL | 2 refills | Status: DC

## 2023-09-30 NOTE — Progress Notes (Unsigned)
COMPLETE PHYSICAL AND MEDICARE ANNUAL WELLNESS VISIT  Assessment and Plan:  Encounter for Medicare Annual Wellness Exam - Due yearly  Encounter for Annual Physical Exam with abnormal findings Due annually  Health Maintenance reviewed Healthy lifestyle reviewed and goals set  Essential hypertension, benign - continue medications, DASH diet, exercise and monitor at home. Call if greater than 130/80.  -     CBC with Differential/Platelet -     COMPLETE METABOLIC PANEL WITH GFR -     TSH -     Magnesium  -     EKG 12-Lead - declined today, just had earlier this year -     Urinalysis, Routine w reflex microscopic -     Microalbumin / creatinine urine ratio  Mixed hyperlipidemia check lipids decrease fatty foods increase activity.  -     Lipid panel  Abnormal glucose -     Hemoglobin A1c Discussed disease progression and risks Discussed diet/exercise, weight management and risk modification  Fibromyalgia Continue diet and exercise  Morbid obesity (HCC) - follow up 3 months for progress monitoring - increase veggies, decrease carbs - long discussion about weight loss, diet, and exercise - hasn't done well with meds, has done well with lifesytle; will get mood and pain better controlled and revisit later this year  H/O gastric bypass Monitor for nutritional deficiencies Check B12 and iron today   Iron deficiency - last was low ferritin, recheck full panel   Adjustment disorder with mixed anxiety and depressed mood Currently on Buspar 10 mg TID  Medication management -     Magnesium  Vitamin D deficiency Continue Vit D supplementation to maintain value in therapeutic level of 60-100  -     VITAMIN D 25 Hydroxy (Vit-D Deficiency, Fractures)  Screening for ischemic heart disease - EKG  Screening for AAA - U/S ABD Retroperitoneal LTD  Screening for hematuria/proteinuria - Routine UA with reflex culture - Micoralbumin/creatinne urine ratio  Screening for  thyroid -TSH  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 Further disposition pending results of labs.  Future Appointments  Date Time Provider Department Center  10/02/2024 10:00 AM Raynelle Dick, NP [facility] None    Plan:   During the course of the visit the patient was educated and counseled about appropriate screening and preventive services including:   Pneumococcal vaccine  Prevnar 13 Influenza vaccine Td vaccine Screening electrocardiogram Bone densitometry screening Colorectal cancer screening Diabetes screening Glaucoma screening Nutrition counseling  Advanced directives: requested   HPI  67 y.o. female  presents for CPE. She has Fibromyalgia; H/O gastric bypass; Mixed hyperlipidemia; Adjustment disorder with mixed anxiety and depressed mood; Other abnormal glucose (prediabetes); Essential hypertension, benign; Medication management; Vitamin D deficiency; Morbid obesity (HCC); B12 deficiency; Iron deficiency; Rotator cuff impingement syndrome of left shoulder; and COVID-19 on their problem list.  She has female partner, Eulogio Bear, also comes here. They run a business together.   She is having a lot of pain with left knee and is being evaluated by orthopedics, states the joint is bone on bone. She saw Dr. August Saucer 2 months ago and he decided not to do an injection.  Pain slightly better with weight loss and frankincence oil   She has FM and depression, symptoms are currently controlled with Wellbutrin  BMI is Body mass index is 39.6  kg/m., she is working on diet and exercise. Hx of gastric bypass in 2006 and lost weight initially (320 lb down to 135 lb) but had complicated recovery and has since gained back. She had negative sleep study in 08/2017. She is using Phentermine 1 tab in AM and 1/2 tab in  afternoon. She has been strict with her food intake and portions but has gained weight with partner and pain situations. She is down 29 pounds from 05/2023 Wt Readings from Last 3 Encounters:  10/03/23 221 lb 12.8 oz (100.6 kg)  07/05/23 233 lb 9.6 oz (106 kg)  05/31/23 250 lb 9.6 oz (113.7 kg)    Her blood pressure has been controlled at home, takes olmesartan 20 mg every day ,today their BP is BP: 122/70   BP Readings from Last 3 Encounters:  10/03/23 122/70  07/05/23 110/64  05/31/23 (!) 146/72  She does not workout. She denies chest pain, shortness of breath, dizziness.     She is not on cholesterol medication, Rosuvastatin 5 mg 3 days a week and denies myalgias. She is strongly decreasing saturated fats. Her cholesterol is not at goal. The cholesterol last visit was:   Lab Results  Component Value Date   CHOL 174 07/08/2023   HDL 53 07/08/2023   LDLCALC 106 (H) 07/08/2023   TRIG 66 07/08/2023   CHOLHDL 3.3 07/08/2023    She has not been working on diet and exercise for prediabetes, and denies foot ulcerations, hyperglycemia, hypoglycemia , increased appetite, nausea, paresthesia of the feet, polydipsia, polyuria, visual disturbances, vomiting and weight loss. Last A1C in the office was:  Lab Results  Component Value Date   HGBA1C 5.8 (H) 07/08/2023   Last GFR:   Lab Results  Component Value Date   EGFR 96 07/08/2023    Patient is on Vitamin D supplement.  Lab Results  Component Value Date   VD25OH 40 10/01/2022     She is taking supplement, not sure of dose.  Lab Results  Component Value Date   VITAMINB12 823 12/02/2021   Lab Results  Component Value Date   IRON 38 (L) 10/01/2022   TIBC 358 10/01/2022   FERRITIN 22 10/01/2022     Current Medications:   Current Outpatient Medications (Endocrine & Metabolic):    metFORMIN (GLUCOPHAGE-XR) 500 MG 24 hr tablet, Take 1 tablet (500 mg total) by mouth 2 (two) times daily with a meal.  Current Outpatient Medications  (Cardiovascular):    olmesartan (BENICAR) 20 MG tablet, Take 1 tablet (20 mg total) by mouth daily.   rosuvastatin (CRESTOR) 5 MG tablet, Take 1 tab by mouth three days a week  Current Outpatient Medications (Respiratory):    Fexofenadine HCl (ALLEGRA PO), Take by mouth.   fluticasone (FLONASE) 50 MCG/ACT nasal spray, Place 1 spray into both nostrils daily.   cetirizine (ZYRTEC) 10 MG tablet, Take 10 mg by mouth daily. (Patient not taking: Reported on 10/03/2023)  Current Outpatient Medications (Analgesics):    Ibuprofen (ADVIL PO), Take by mouth.   meloxicam (MOBIC) 15 MG tablet, Take 1 tablet (15 mg total) by mouth daily.   Current Outpatient Medications (Other):    buPROPion (WELLBUTRIN XL) 150 MG 24 hr tablet, Take 1 tablet (150 mg total) by mouth every morning.   busPIRone (BUSPAR) 10 MG tablet, Take 10 mg by mouth 3 (three) times daily as needed.   Calcium Carbonate-Vit D-Min (CALCIUM 1200 PO), Take by mouth daily.   Multiple Vitamins-Minerals (MULTIVITAMIN WITH MINERALS) tablet, Take 1  tablet by mouth daily.   phentermine (ADIPEX-P) 37.5 MG tablet, TAKE 1 TAB EVERY AM AND 1/2 TAB AT LUNCH  Medical History:  Past Medical History:  Diagnosis Date   Abdominal wall abscess    Adjustment disorder with mixed anxiety and depressed mood 11/25/2013   Fibromyalgia    H/O gastric bypass    Mixed hyperlipidemia 11/25/2013   Immunization History  Administered Date(s) Administered   Influenza Inj Mdck Quad With Preservative 08/29/2019   Influenza, High Dose Seasonal PF 10/03/2023   PFIZER(Purple Top)SARS-COV-2 Vaccination 01/12/2020, 02/05/2020, 10/03/2020   Pneumococcal-Unspecified 01/01/2004   Td 10/18/2000   Tdap 07/05/2013   Zoster, Live 06/03/2017   Health Maintenance  Topic Date Due   Zoster Vaccines- Shingrix (1 of 2) 10/18/1974   Colonoscopy  Never done   Pneumonia Vaccine 67+ Years old (1 of 1 - PCV) 10/18/2020   COVID-19 Vaccine (4 - 2024-25 season) 06/19/2023    DTaP/Tdap/Td (3 - Td or Tdap) 07/06/2023   MAMMOGRAM  12/17/2023   Medicare Annual Wellness (AWV)  10/02/2024   INFLUENZA VACCINE  Completed   DEXA SCAN  Completed   Hepatitis C Screening  Completed   HPV VACCINES  Aged Out   Dr, Zenaida Niece 07/2023 Dr. Naomie Dean 03/2023   MEDICARE WELLNESS OBJECTIVES: Physical activity:  Some walking, limited due to knee pain Cardiac risk factors: Cardiac Risk Factors include: advanced age (>83men, >88 women);dyslipidemia;hypertension;obesity (BMI >30kg/m2);sedentary lifestyle Depression/mood screen:      10/03/2023   10:52 AM  Depression screen PHQ 2/9  Decreased Interest 0  Down, Depressed, Hopeless 0  PHQ - 2 Score 0    ADLs:     10/03/2023   10:50 AM  In your present state of health, do you have any difficulty performing the following activities:  Hearing? 0  Vision? 0  Difficulty concentrating or making decisions? 0  Walking or climbing stairs? 1  Dressing or bathing? 0  Doing errands, shopping? 0  Preparing Food and eating ? N  Using the Toilet? N  In the past six months, have you accidently leaked urine? N  Do you have problems with loss of bowel control? N  Managing your Medications? N  Managing your Finances? N  Housekeeping or managing your Housekeeping? N     Cognitive Testing  Alert? Yes  Normal Appearance?Yes  Oriented to person? Yes  Place? Yes   Time? Yes  Recall of three objects?  Yes  Can perform simple calculations? Yes  Displays appropriate judgment?Yes  Can read the correct time from a watch face?Yes  EOL planning: Does Patient Have a Medical Advance Directive?: Yes Type of Advance Directive: Healthcare Power of Attorney, Living will Does patient want to make changes to medical advance directive?: No - Patient declined Copy of Healthcare Power of Attorney in Chart?: No - copy requested      Review of Systems:  Review of Systems  Constitutional:  Negative for chills, fever, malaise/fatigue and weight loss.   HENT:  Negative for congestion, ear pain, hearing loss, sore throat and tinnitus.   Eyes: Negative.  Negative for blurred vision and double vision.  Respiratory:  Negative for cough, sputum production, shortness of breath and wheezing.   Cardiovascular:  Negative for chest pain, palpitations, orthopnea, claudication, leg swelling and PND.  Gastrointestinal:  Negative for abdominal pain, blood in stool, constipation, diarrhea, heartburn, melena, nausea and vomiting.  Genitourinary: Negative.   Musculoskeletal:  Positive for back pain (low back chronic) and joint pain (knees). Negative for  falls, myalgias and neck pain.  Skin:  Negative for rash.  Neurological:  Negative for dizziness, tingling, sensory change, loss of consciousness, weakness and headaches.  Endo/Heme/Allergies:  Negative for polydipsia.  Psychiatric/Behavioral:  Positive for depression. Negative for memory loss, substance abuse and suicidal ideas. The patient is nervous/anxious and has insomnia.   All other systems reviewed and are negative.  Allergies Allergies  Allergen Reactions   E-Mycin [Erythromycin]     GI upset    SURGICAL HISTORY She  has a past surgical history that includes Gastric bypass (03/02/2005); Tonsillectomy; Appendectomy; Wisdom tooth extraction; salivary stones removed; Exploratory laparotomy (2006); and intestinal perforation. FAMILY HISTORY Her family history includes Alzheimer's disease in her father; Emphysema in her mother; Heart disease in her father; Multiple sclerosis in her sister; Osteoporosis in her mother. SOCIAL HISTORY She  reports that she has never smoked. She has never used smokeless tobacco. She reports current alcohol use. She reports that she does not use drugs.   Physical Exam: BP 122/70   Pulse 76   Temp 97.9 F (36.6 C)   Ht 5' 2.75" (1.594 m)   Wt 221 lb 12.8 oz (100.6 kg)   SpO2 95%   BMI 39.60 kg/m  Wt Readings from Last 3 Encounters:  10/03/23 221 lb 12.8 oz  (100.6 kg)  07/05/23 233 lb 9.6 oz (106 kg)  05/31/23 250 lb 9.6 oz (113.7 kg)    General Appearance: Well nourished well developed, in no apparent distress. Eyes: PERRLA, EOMs, conjunctiva no swelling or erythema ENT/Mouth: Ear canals normal without obstruction, swelling, erythma, discharge.  TMs normal bilaterally.  Oropharynx erythema, no exudate Neck: Supple, thyroid normal,no cervical adenopathy  Respiratory: Respiratory effort normal, Breath sounds clear A&P without rhonchi, wheeze, or rale.  No retractions, no accessory usage. Cardio: RRR with no MRGs. Brisk peripheral pulses without edema.  Abdomen: Soft, + BS,  Non tender, no guarding, rebound, hernias, masses. Musculoskeletal: Full ROM , crepitus of left knee with extension Slow antalgic gait  Skin: Warm, dry without rashes, ecchymosis.  Neuro: Awake and oriented X 3, Cranial nerves intact. Normal muscle tone, no cerebellar symptoms. Psych: Depressed/tearful affect, Insight and Judgment appropriate.  GYN: defer Breasts: Defer- mammogram UTD  EKG: NSR,  no ST changes  Aorta Scan: < 3 cm    Medicare Attestation I have personally reviewed: The patient's medical and social history Their use of alcohol, tobacco or illicit drugs Their current medications and supplements The patient's functional ability including ADLs,fall risks, home safety risks, cognitive, and hearing and visual impairment Diet and physical activities Evidence for depression or mood disorders  The patient's weight, height, BMI, and visual acuity have been recorded in the chart.  I have made referrals, counseling, and provided education to the patient based on review of the above and I have provided the patient with a written personalized care plan for preventive services.     Raynelle Dick, NP 10:52 AM [facility]

## 2023-10-03 ENCOUNTER — Encounter: Payer: Self-pay | Admitting: Nurse Practitioner

## 2023-10-03 ENCOUNTER — Ambulatory Visit (INDEPENDENT_AMBULATORY_CARE_PROVIDER_SITE_OTHER): Payer: Medicare Other | Admitting: Nurse Practitioner

## 2023-10-03 VITALS — BP 122/70 | HR 76 | Temp 97.9°F | Ht 62.75 in | Wt 221.8 lb

## 2023-10-03 DIAGNOSIS — R7309 Other abnormal glucose: Secondary | ICD-10-CM | POA: Diagnosis not present

## 2023-10-03 DIAGNOSIS — Z0001 Encounter for general adult medical examination with abnormal findings: Secondary | ICD-10-CM

## 2023-10-03 DIAGNOSIS — M797 Fibromyalgia: Secondary | ICD-10-CM

## 2023-10-03 DIAGNOSIS — E559 Vitamin D deficiency, unspecified: Secondary | ICD-10-CM | POA: Diagnosis not present

## 2023-10-03 DIAGNOSIS — F321 Major depressive disorder, single episode, moderate: Secondary | ICD-10-CM

## 2023-10-03 DIAGNOSIS — Z79899 Other long term (current) drug therapy: Secondary | ICD-10-CM

## 2023-10-03 DIAGNOSIS — E782 Mixed hyperlipidemia: Secondary | ICD-10-CM

## 2023-10-03 DIAGNOSIS — R6889 Other general symptoms and signs: Secondary | ICD-10-CM | POA: Diagnosis not present

## 2023-10-03 DIAGNOSIS — Z1389 Encounter for screening for other disorder: Secondary | ICD-10-CM

## 2023-10-03 DIAGNOSIS — Z23 Encounter for immunization: Secondary | ICD-10-CM | POA: Diagnosis not present

## 2023-10-03 DIAGNOSIS — I10 Essential (primary) hypertension: Secondary | ICD-10-CM

## 2023-10-03 DIAGNOSIS — Z136 Encounter for screening for cardiovascular disorders: Secondary | ICD-10-CM

## 2023-10-03 DIAGNOSIS — Z1329 Encounter for screening for other suspected endocrine disorder: Secondary | ICD-10-CM

## 2023-10-03 DIAGNOSIS — I700 Atherosclerosis of aorta: Secondary | ICD-10-CM | POA: Diagnosis not present

## 2023-10-03 DIAGNOSIS — C4442 Squamous cell carcinoma of skin of scalp and neck: Secondary | ICD-10-CM

## 2023-10-03 DIAGNOSIS — Z9884 Bariatric surgery status: Secondary | ICD-10-CM

## 2023-10-03 DIAGNOSIS — Z0000 Encounter for general adult medical examination without abnormal findings: Secondary | ICD-10-CM

## 2023-10-03 DIAGNOSIS — F4323 Adjustment disorder with mixed anxiety and depressed mood: Secondary | ICD-10-CM

## 2023-10-03 NOTE — Patient Instructions (Signed)

## 2023-10-04 LAB — URINALYSIS, ROUTINE W REFLEX MICROSCOPIC
Bilirubin Urine: NEGATIVE
Glucose, UA: NEGATIVE
Hgb urine dipstick: NEGATIVE
Ketones, ur: NEGATIVE
Leukocytes,Ua: NEGATIVE
Nitrite: NEGATIVE
Protein, ur: NEGATIVE
Specific Gravity, Urine: 1.007 (ref 1.001–1.035)
pH: 5.5 (ref 5.0–8.0)

## 2023-10-04 LAB — COMPLETE METABOLIC PANEL WITH GFR
AG Ratio: 1.4 (calc) (ref 1.0–2.5)
ALT: 13 U/L (ref 6–29)
AST: 15 U/L (ref 10–35)
Albumin: 4.2 g/dL (ref 3.6–5.1)
Alkaline phosphatase (APISO): 66 U/L (ref 37–153)
BUN: 15 mg/dL (ref 7–25)
CO2: 28 mmol/L (ref 20–32)
Calcium: 10.3 mg/dL (ref 8.6–10.4)
Chloride: 106 mmol/L (ref 98–110)
Creat: 0.77 mg/dL (ref 0.50–1.05)
Globulin: 2.9 g/dL (ref 1.9–3.7)
Glucose, Bld: 85 mg/dL (ref 65–99)
Potassium: 5 mmol/L (ref 3.5–5.3)
Sodium: 144 mmol/L (ref 135–146)
Total Bilirubin: 0.5 mg/dL (ref 0.2–1.2)
Total Protein: 7.1 g/dL (ref 6.1–8.1)
eGFR: 84 mL/min/{1.73_m2} (ref >=60)

## 2023-10-04 LAB — MICROALBUMIN / CREATININE URINE RATIO
Creatinine, Urine: 27 mg/dL (ref 20–275)
Microalb, Ur: <0.2 mg/dL

## 2023-10-04 LAB — CBC WITH DIFFERENTIAL/PLATELET
Absolute Lymphocytes: 1800 {cells}/uL (ref 850–3900)
Absolute Monocytes: 420 {cells}/uL (ref 200–950)
Basophils Absolute: 30 {cells}/uL (ref 0–200)
Basophils Relative: 0.4 %
Eosinophils Absolute: 53 {cells}/uL (ref 15–500)
Eosinophils Relative: 0.7 %
HCT: 45.4 % — ABNORMAL HIGH (ref 35.0–45.0)
Hemoglobin: 14.2 g/dL (ref 11.7–15.5)
MCH: 27.6 pg (ref 27.0–33.0)
MCHC: 31.3 g/dL — ABNORMAL LOW (ref 32.0–36.0)
MCV: 88.3 fL (ref 80.0–100.0)
MPV: 12.3 fL (ref 7.5–12.5)
Monocytes Relative: 5.6 %
Neutro Abs: 5198 {cells}/uL (ref 1500–7800)
Neutrophils Relative %: 69.3 %
Platelets: 258 10*3/uL (ref 140–400)
RBC: 5.14 10*6/uL — ABNORMAL HIGH (ref 3.80–5.10)
RDW: 13 % (ref 11.0–15.0)
Total Lymphocyte: 24 %
WBC: 7.5 10*3/uL (ref 3.8–10.8)

## 2023-10-04 LAB — VITAMIN D 25 HYDROXY (VIT D DEFICIENCY, FRACTURES): Vit D, 25-Hydroxy: 40 ng/mL (ref 30–100)

## 2023-10-04 LAB — TSH: TSH: 1.79 m[IU]/L (ref 0.40–4.50)

## 2023-10-04 LAB — HEMOGLOBIN A1C W/OUT EAG: Hgb A1c MFr Bld: 5.8 %{Hb} — ABNORMAL HIGH (ref <5.7)

## 2023-10-04 LAB — LIPID PANEL
Cholesterol: 155 mg/dL (ref <200)
HDL: 69 mg/dL (ref >=50)
LDL Cholesterol (Calc): 69 mg/dL
Non-HDL Cholesterol (Calc): 86 mg/dL (ref <130)
Total CHOL/HDL Ratio: 2.2 (calc) (ref <5.0)
Triglycerides: 87 mg/dL (ref <150)

## 2023-10-04 LAB — MAGNESIUM: Magnesium: 2 mg/dL (ref 1.5–2.5)

## 2023-10-17 ENCOUNTER — Other Ambulatory Visit: Payer: Self-pay | Admitting: Nurse Practitioner

## 2023-10-17 ENCOUNTER — Encounter: Payer: Self-pay | Admitting: Nurse Practitioner

## 2023-10-17 DIAGNOSIS — I10 Essential (primary) hypertension: Secondary | ICD-10-CM

## 2023-10-17 DIAGNOSIS — R7309 Other abnormal glucose: Secondary | ICD-10-CM

## 2023-10-17 MED ORDER — PHENTERMINE HCL 37.5 MG PO TABS: ORAL_TABLET | ORAL | 0 refills | Status: DC

## 2023-10-17 MED ORDER — METFORMIN HCL ER 500 MG PO TB24: 500.0000 mg | ORAL_TABLET | Freq: Two times a day (BID) | ORAL | 3 refills | Status: DC

## 2024-01-19 ENCOUNTER — Ambulatory Visit
Admission: EM | Admit: 2024-01-19 | Discharge: 2024-01-19 | Disposition: A | Discharge status: Home / Self Care | Attending: Family Medicine | Admitting: Family Medicine

## 2024-01-19 DIAGNOSIS — I10 Essential (primary) hypertension: Secondary | ICD-10-CM

## 2024-01-19 DIAGNOSIS — R030 Elevated blood-pressure reading, without diagnosis of hypertension: Secondary | ICD-10-CM | POA: Diagnosis not present

## 2024-01-19 MED ORDER — OLMESARTAN MEDOXOMIL 20 MG PO TABS
20.0000 mg | ORAL_TABLET | Freq: Every day | ORAL | 1 refills | Status: DC
Start: 2024-01-19 — End: 2024-02-22

## 2024-01-19 NOTE — ED Triage Notes (Signed)
 Pt requesting BP med refill- olemstartan 20mg  daily-states here PCP died and she is awaiting new PCP appt next month-NAD-steady gait

## 2024-01-19 NOTE — Discharge Instructions (Addendum)
For diabetes or elevated blood sugar, please make sure you are limiting and avoiding starchy, carbohydrate foods like pasta, breads, sweet breads, pastry, rice, potatoes, desserts. These foods can elevate your blood sugar. Also, limit and avoid drinks that contain a lot of sugar such as sodas, sweet teas, fruit juices.  Drinking plain water will be much more helpful, try 64 ounces of water daily.  It is okay to flavor your water naturally by cutting cucumber, lemon, mint or lime, placing it in a picture with water and drinking it over a period of 24-48 hours as long as it remains refrigerated. ? ?For elevated blood pressure, make sure you are monitoring salt in your diet.  Do not eat restaurant foods and limit processed foods at home. I highly recommend you prepare and cook your own foods at home.  Processed foods include things like frozen meals, pre-seasoned meats and dinners, deli meats, canned foods as these foods contain a high amount of sodium/salt.  Make sure you are paying attention to sodium labels on foods you buy at the grocery store. Buy your spices separately such as garlic powder, onion powder, cumin, cayenne, parsley flakes so that you can avoid seasonings that contain salt. However, salt-free seasonings are available and can be used, an example is Mrs. Dash and includes a lot of different mixtures that do not contain salt. ? ?Lastly, when cooking using oils that are healthier for you is important. This includes olive oil, avocado oil, canola oil. We have discussed a lot of foods to avoid but below is a list of foods that can be very healthy to use in your diet whether it is for diabetes, cholesterol, high blood pressure, or in general healthy eating. ? ?Salads - kale, spinach, cabbage, spring mix, arugula ?Fruits - avocadoes, berries (blueberries, raspberries, blackberries), apples, oranges, pomegranate, grapefruit, kiwi ?Vegetables - asparagus, cauliflower, broccoli, green beans, brussel sprouts,  bell peppers, beets; stay away from or limit starchy vegetables like potatoes, carrots, peas ?Other general foods - kidney beans, egg whites, almonds, walnuts, sunflower seeds, pumpkin seeds, fat free yogurt, almond milk, flax seeds, quinoa, oats  ?Meat - It is better to eat lean meats and limit your red meat including pork to once a week.  Wild caught fish, chicken breast are good options as they tend to be leaner sources of good protein. Still be mindful of the sodium labels for the meats you buy. ? ?DO NOT EAT ANY FOODS ON THIS LIST THAT YOU ARE ALLERGIC TO. For more specific needs, I highly recommend consulting a dietician or nutritionist but this can definitely be a good starting point. ? ?

## 2024-01-19 NOTE — ED Provider Notes (Signed)
 [facility]  Note:  This document was prepared using Conservation officer, historic buildings and may include unintentional dictation errors.  MRN: [id number] DOB: 10/18/1956  Subjective:   Nicole Padilla is a 68 y.o. female presenting for medication refill for her blood pressure.  Patient had a PCP but unfortunately they passed away and she is in the process of establishing care with a new PCP.  She ran out of her blood pressure medicine in the past week.  Her appointment is set for next month.  Denies any active symptoms.  No history of stroke, heart disease, MI.  No current facility-administered medications for this encounter.  Current Outpatient Medications:    buPROPion (WELLBUTRIN XL) 150 MG 24 hr tablet, Take 1 tablet (150 mg total) by mouth every morning., Disp: 90 tablet, Rfl: 2   busPIRone (BUSPAR) 10 MG tablet, Take 10 mg by mouth 3 (three) times daily as needed., Disp: , Rfl:    Calcium Carbonate-Vit D-Min (CALCIUM 1200 PO), Take by mouth daily., Disp: , Rfl:    Fexofenadine HCl (ALLEGRA PO), Take by mouth., Disp: , Rfl:    fluticasone (FLONASE) 50 MCG/ACT nasal spray, Place 1 spray into both nostrils daily., Disp: 15.8 mL, Rfl: 0   Ibuprofen (ADVIL PO), Take by mouth., Disp: , Rfl:    meloxicam (MOBIC) 15 MG tablet, Take 1 tablet (15 mg total) by mouth daily., Disp: 30 tablet, Rfl: 1   metFORMIN (GLUCOPHAGE-XR) 500 MG 24 hr tablet, Take 1 tablet (500 mg total) by mouth 2 (two) times daily with a meal., Disp: 60 tablet, Rfl: 3   Multiple Vitamins-Minerals (MULTIVITAMIN WITH MINERALS) tablet, Take 1 tablet by mouth daily., Disp: , Rfl:    olmesartan (BENICAR) 20 MG tablet, Take 1 tablet (20 mg total) by mouth daily., Disp: 90 tablet, Rfl: 3   phentermine (ADIPEX-P) 37.5 MG tablet, TAKE 1 TAB EVERY AM AND 1/2 TAB AT LUNCH, Disp: 135 tablet, Rfl: 0   rosuvastatin (CRESTOR) 5 MG tablet, Take 1 tab by mouth three days a week, Disp: 36 tablet, Rfl: 2   Allergies   Allergen Reactions   E-Mycin [Erythromycin]     GI upset    Past Medical History:  Diagnosis Date   Abdominal wall abscess    Adjustment disorder with mixed anxiety and depressed mood 11/25/2013   Fibromyalgia    H/O gastric bypass    Mixed hyperlipidemia 11/25/2013     Past Surgical History:  Procedure Laterality Date   APPENDECTOMY     EXPLORATORY LAPAROTOMY  2006   post bypass, had intestinal preforation   GASTRIC BYPASS  03/02/2005   intestinal perforation     salivary stones removed     TONSILLECTOMY     WISDOM TOOTH EXTRACTION      Family History  Problem Relation Age of Onset   Osteoporosis Mother    Emphysema Mother    Alzheimer's disease Father    Heart disease Father    Multiple sclerosis Sister     Social History   Tobacco Use   Smoking status: Never   Smokeless tobacco: Never  Vaping Use   Vaping status: Never Used  Substance Use Topics   Alcohol use: Yes    Comment: rarely   Drug use: Never    ROS   Objective:   Vitals: BP (!) 158/86 (BP Location: Right Arm)   Pulse 64   Temp 99 F (37.2 C) (Oral)   Resp 20   SpO2 95%  BP Readings from Last 3 Encounters:  01/19/24 (!) 158/86  10/03/23 122/70  07/05/23 110/64    Physical Exam Constitutional:      General: She is not in acute distress.    Appearance: Normal appearance. She is well-developed. She is not ill-appearing, toxic-appearing or diaphoretic.  HENT:     Head: Normocephalic and atraumatic.     Nose: Nose normal.     Mouth/Throat:     Mouth: Mucous membranes are moist.  Eyes:     General: No scleral icterus.       Right eye: No discharge.        Left eye: No discharge.     Extraocular Movements: Extraocular movements intact.  Cardiovascular:     Rate and Rhythm: Normal rate and regular rhythm.     Heart sounds: Normal heart sounds. No murmur heard.    No friction rub. No gallop.  Pulmonary:     Effort: Pulmonary effort is normal. No respiratory distress.     Breath  sounds: No stridor. No wheezing, rhonchi or rales.  Chest:     Chest wall: No tenderness.  Skin:    General: Skin is warm and dry.  Neurological:     General: No focal deficit present.     Mental Status: She is alert and oriented to person, place, and time.     Cranial Nerves: No cranial nerve deficit.     Motor: No weakness.     Coordination: Coordination normal.     Gait: Gait normal.     Comments: Negative Romberg and pronator drift.  No facial asymmetry.  Psychiatric:        Mood and Affect: Mood normal.        Behavior: Behavior normal.     Assessment and Plan :   PDMP not reviewed this encounter.  1. Essential hypertension   2. Elevated blood pressure reading   3. Essential hypertension, benign     Provided with a 30-day refill at patient request.  There is 1 courtesy refill behind that.  Recommend maintaining healthy lifestyle.  Follow-up with her new PCP as soon as possible.  Counseled patient on potential for adverse effects with medications prescribed/recommended today, ER and return-to-clinic precautions discussed, patient verbalized understanding.    Wallis Bamberg, [place] 01/19/24 1353

## 2024-02-22 ENCOUNTER — Encounter: Payer: Self-pay | Admitting: Physician Assistant

## 2024-02-22 ENCOUNTER — Ambulatory Visit (INDEPENDENT_AMBULATORY_CARE_PROVIDER_SITE_OTHER): Admitting: Physician Assistant

## 2024-02-22 VITALS — BP 144/80 | HR 68 | Temp 98.2°F | Ht 63.0 in | Wt 226.8 lb

## 2024-02-22 DIAGNOSIS — Z6841 Body Mass Index (BMI) 40.0 and over, adult: Secondary | ICD-10-CM

## 2024-02-22 DIAGNOSIS — E538 Deficiency of other specified B group vitamins: Secondary | ICD-10-CM | POA: Diagnosis not present

## 2024-02-22 DIAGNOSIS — M85851 Other specified disorders of bone density and structure, right thigh: Secondary | ICD-10-CM

## 2024-02-22 DIAGNOSIS — E7841 Elevated Lipoprotein(a): Secondary | ICD-10-CM

## 2024-02-22 DIAGNOSIS — E669 Obesity, unspecified: Secondary | ICD-10-CM

## 2024-02-22 DIAGNOSIS — I10 Essential (primary) hypertension: Secondary | ICD-10-CM | POA: Diagnosis not present

## 2024-02-22 DIAGNOSIS — G894 Chronic pain syndrome: Secondary | ICD-10-CM

## 2024-02-22 DIAGNOSIS — F419 Anxiety disorder, unspecified: Secondary | ICD-10-CM

## 2024-02-22 DIAGNOSIS — E88819 Insulin resistance, unspecified: Secondary | ICD-10-CM | POA: Insufficient documentation

## 2024-02-22 DIAGNOSIS — Z9884 Bariatric surgery status: Secondary | ICD-10-CM

## 2024-02-22 DIAGNOSIS — E782 Mixed hyperlipidemia: Secondary | ICD-10-CM

## 2024-02-22 DIAGNOSIS — I6522 Occlusion and stenosis of left carotid artery: Secondary | ICD-10-CM

## 2024-02-22 DIAGNOSIS — E559 Vitamin D deficiency, unspecified: Secondary | ICD-10-CM

## 2024-02-22 DIAGNOSIS — D509 Iron deficiency anemia, unspecified: Secondary | ICD-10-CM

## 2024-02-22 LAB — CBC WITH DIFFERENTIAL/PLATELET
Basophils Absolute: 0 10*3/uL (ref 0.0–0.1)
Basophils Relative: 0.8 % (ref 0.0–3.0)
Eosinophils Absolute: 0 10*3/uL (ref 0.0–0.7)
Eosinophils Relative: 0.8 % (ref 0.0–5.0)
HCT: 40.3 % (ref 36.0–46.0)
Hemoglobin: 13 g/dL (ref 12.0–15.0)
Lymphocytes Relative: 28.1 % (ref 12.0–46.0)
Lymphs Abs: 1.6 10*3/uL (ref 0.7–4.0)
MCHC: 32.2 g/dL (ref 30.0–36.0)
MCV: 88.3 fl (ref 78.0–100.0)
Monocytes Absolute: 0.2 10*3/uL (ref 0.1–1.0)
Monocytes Relative: 3.6 % (ref 3.0–12.0)
Neutro Abs: 3.7 10*3/uL (ref 1.4–7.7)
Neutrophils Relative %: 66.7 % (ref 43.0–77.0)
Platelets: 196 10*3/uL (ref 150.0–400.0)
RBC: 4.56 Mil/uL (ref 3.87–5.11)
RDW: 14.3 % (ref 11.5–15.5)
WBC: 5.5 10*3/uL (ref 4.0–10.5)

## 2024-02-22 LAB — VITAMIN D 25 HYDROXY (VIT D DEFICIENCY, FRACTURES): VITD: 26.42 ng/mL — ABNORMAL LOW (ref 30.00–100.00)

## 2024-02-22 LAB — IBC + FERRITIN
Ferritin: 31.1 ng/mL (ref 10.0–291.0)
Iron: 58 ug/dL (ref 42–145)
Saturation Ratios: 16.8 % — ABNORMAL LOW (ref 20.0–50.0)
TIBC: 344.4 ug/dL (ref 250.0–450.0)
Transferrin: 246 mg/dL (ref 212.0–360.0)

## 2024-02-22 LAB — COMPREHENSIVE METABOLIC PANEL WITH GFR
ALT: 14 U/L (ref 0–35)
AST: 16 U/L (ref 0–37)
Albumin: 3.9 g/dL (ref 3.5–5.2)
Alkaline Phosphatase: 62 U/L (ref 39–117)
BUN: 13 mg/dL (ref 6–23)
CO2: 28 meq/L (ref 19–32)
Calcium: 9.1 mg/dL (ref 8.4–10.5)
Chloride: 107 meq/L (ref 96–112)
Creatinine, Ser: 0.6 mg/dL (ref 0.40–1.20)
GFR: 92.28 mL/min (ref >60.00)
Glucose, Bld: 110 mg/dL — ABNORMAL HIGH (ref 70–99)
Potassium: 4.3 meq/L (ref 3.5–5.1)
Sodium: 141 meq/L (ref 135–145)
Total Bilirubin: 0.3 mg/dL (ref 0.2–1.2)
Total Protein: 6.1 g/dL (ref 6.0–8.3)

## 2024-02-22 LAB — HEMOGLOBIN A1C: Hgb A1c MFr Bld: 5.7 % (ref 4.6–6.5)

## 2024-02-22 LAB — LIPID PANEL
Cholesterol: 138 mg/dL (ref 0–200)
HDL: 59.6 mg/dL (ref >39.00)
LDL Cholesterol: 67 mg/dL (ref 0–99)
NonHDL: 78.54
Total CHOL/HDL Ratio: 2
Triglycerides: 60 mg/dL (ref 0.0–149.0)
VLDL: 12 mg/dL (ref 0.0–40.0)

## 2024-02-22 LAB — C-REACTIVE PROTEIN: CRP: 1.3 mg/dL (ref 0.5–20.0)

## 2024-02-22 LAB — SEDIMENTATION RATE: Sed Rate: 23 mm/h (ref 0–30)

## 2024-02-22 LAB — VITAMIN B12: Vitamin B-12: 559 pg/mL (ref 211–911)

## 2024-02-22 MED ORDER — AMITRIPTYLINE HCL 10 MG PO TABS: 10.0000 mg | ORAL_TABLET | Freq: Every day | ORAL | 1 refills | Status: AC

## 2024-02-22 MED ORDER — ROSUVASTATIN CALCIUM 5 MG PO TABS: ORAL_TABLET | ORAL | 2 refills | Status: DC

## 2024-02-22 MED ORDER — OLMESARTAN MEDOXOMIL 20 MG PO TABS: 20.0000 mg | ORAL_TABLET | Freq: Every day | ORAL | 1 refills | Status: DC

## 2024-02-22 NOTE — Patient Instructions (Signed)
 It was great to see you!  Restart elavil  10 mg daily  A referral has been placed for you to see one of our fantastic providers at [facility]. Someone from their office will be in touch soon regarding scheduling your appointment.  Their location:   [facility] Medicine at [facility]  [address] on the 1st floor Phone number [phone] Fax [phone].   This location is across the street from the entrance to [employer] and in the same complex as the [facility]  I'll be in touch with all blood work results and plan/recommendations  Lets follow up in 1-2 months to check in on everything -- sooner if concerns  Take care,  Cassondra Stachowski PA-C

## 2024-02-22 NOTE — Progress Notes (Signed)
 Nicole Padilla is a 68 y.o. female here to establish care.  History of Present Illness:   Chief Complaint  Patient presents with   New Patient (Initial Visit)    Patient states arthritis can't bend feet or toes states walking is very hard. Sleep issues.    Former PCP:  Dr. Vangie Genet  Relevant medical history: S/p gastric bypass:  Had roux-en-y gastric bypass in 2006, had post-op complications.  Her intestine tore after eating her first solid meal. She had to be hospitalized multiple times and during that time went into sepsis and had multiple abdominal abscesses.  Her peak weight was around 350 lbs prior to surgery.  She was prescribed Wegovy  in 2021 - was given a sample of 0.25 mg and was never able to either find the next dose or get insurance coverage  She took phentermine  37.5 mg in June 2024 -- increased to 1.5 tabs daily Wellbutrin  for depression and obesity  Metformin  500 mg twice daily Aug 2024   Hx of osteopenia Most recent bone density was done 07/2022.  Results showed osteopenia She does take calcium  and vitamin D  Has fmhx of osteoporosis; states osteoporosis was on her mother's death certificate due to a spinal compression fracture.   Chronic issues: Fibromyalgia // Arthritis // bone spurs: Was diagnosed by a Rheumatologist in 2006/2007 and was reccommended to make lifestyle changes.  She was told that she had false positive autoimmune labs. We do not have these labs to review. 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  She does take Naproxen , Aleve , and NSAIDs despite s/p gastric bypass recommendations due to her chronic pain.   Per chart review she has tried --  Naproxen -Esomeprazole  500-20 mg daily 2015 Duloxetine  90 mg Meloxicam  15 mg  Amitriptyline  100 mg Wellbutrin  150 mg xl daily Salon Pas patches Tramadol  Topical Voltaren  gel Prednisone  Gabapentin  300 mg three  times daily   Has been seeing Dr Zandra Hew with [facility]   She previously managed her pain with Meloxicam , but has been unable to obtain refills while trying to re-establish care with a PCP.  She has been sleeping sitting up on her recliner couch since 2009 due to severe hip pain when lying flat on her back.   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   Pt wears one pair of tennis shoes that does not have good support, but is unable to wear other shoes with arch support due to pain from her flat feet. She previously tried aquatic therapy but stopped, reporting pain due to resistance against her knees.   Hyperlipidemia: Pt is compliant with Rosuvastatin  5 mg 3 days per week.  Good compliance and tolerance.  Was working on healthier eating habits to avoid starting statin therapy.  Pt reports her cholesterol improved by 50 points, but starting a low dose statin was still recommended.   Anxiety: Pt is on Buspar  10 mg once daily in the mornings.  Tolerating well with no side effects.  She reports minimal clinical effect with Buspar .  Was previously on Wellbutrin  XL and amitriptyline  for insomnia.   Reports good sleep quality while taking amitriptyline ; was taken off due to concerns of weight gain.  Pt denies any depression, states she mostly experiences anxiety.   Hypertension: Pt is prescribed Olmesartan  20 mg once daily.  States her blood pressure has been overall well  controlled.  She attributes her elevated BP today to being "excited" for her first visit today.   Left carotid calcification Seen on xray in in 2006 as well as 2024.  Past Medical History:  Diagnosis Date   Abdominal wall abscess    Anxiety    Fibromyalgia 2006   H/O gastric bypass    HTN (hypertension)    Mixed hyperlipidemia 11/25/2013     Social History   Tobacco Use   Smoking status: Never   Smokeless tobacco: Never   Vaping Use   Vaping status: Never Used  Substance Use Topics   Alcohol use: Yes    Comment: rarely   Drug use: Never    Past Surgical History:  Procedure Laterality Date   APPENDECTOMY     EXPLORATORY LAPAROTOMY  2006   post bypass, had intestinal preforation   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     JEJUNOSTOMY REVISION  08/18/2005   salivary stones removed     TONSILLECTOMY     WISDOM TOOTH EXTRACTION      Family History  Problem Relation Age of Onset   Osteoporosis Mother    Emphysema Mother    Alzheimer's disease Father    Heart disease Father    Multiple sclerosis Sister     Allergies  Allergen Reactions   E-Mycin [Erythromycin]     GI upset    Current Medications:   Current Outpatient Medications:    amitriptyline  (ELAVIL ) 10 MG tablet, Take 1 tablet (10 mg total) by mouth at bedtime., Disp: 90 tablet, Rfl: 1   ascorbic acid (VITAMIN C) 500 MG tablet, Take 500 mg by mouth daily., Disp: , Rfl:    busPIRone  (BUSPAR ) 10 MG tablet, Take 10 mg by mouth daily., Disp: , Rfl:    Calcium  Carbonate-Vit D-Min (CALCIUM  1200 PO), Take by mouth daily., Disp: , Rfl:    Cholecalciferol (VITAMIN D -3) 125 MCG (5000 UT) TABS, Take 5,000 Int'l Units/day by mouth daily., Disp: , Rfl:    cyanocobalamin (VITAMIN B12) 1000 MCG tablet, Take 1,000 mcg by mouth daily., Disp: , Rfl:    Fexofenadine HCl (ALLEGRA PO), Take by mouth., Disp: , Rfl:    fluticasone  (FLONASE ) 50 MCG/ACT nasal spray, Place 1 spray into both nostrils daily., Disp: 15.8 mL, Rfl: 0   Ibuprofen (ADVIL PO), Take by mouth., Disp: , Rfl:    Magnesium Glycinate 100 MG CAPS, Take 100 Int'l Units/hr by mouth in the morning, at noon, and at bedtime. 300 once a day, Disp: , Rfl:    Multiple Vitamins-Minerals (MULTIVITAMIN WITH MINERALS) tablet, Take 1 tablet by mouth daily., Disp: , Rfl:    rosuvastatin  (CRESTOR ) 5 MG tablet, Take 1 tab by mouth three days a week, Disp: 36 tablet, Rfl: 2    olmesartan  (BENICAR ) 20 MG tablet, Take 1 tablet (20 mg total) by mouth daily., Disp: 90 tablet, Rfl: 1   Review of Systems:   Negative unless otherwise specified per HPI.  Vitals:   Vitals:   02/22/24 1042 02/22/24 1132  BP: (!) 142/80 (!) 144/80  Pulse: 68   Temp: 98.2 F (36.8 C)   TempSrc: Temporal   SpO2: 98%   Weight: 226 lb 12.8 oz (102.9 kg)   Height: 5\' 3"  (1.6 m)      Body mass index is 40.18 kg/m.  Physical Exam:   Physical Exam Vitals and nursing note reviewed.  Constitutional:      General: She is not in acute  distress.    Appearance: She is well-developed. She is not ill-appearing or toxic-appearing.  Cardiovascular:     Rate and Rhythm: Normal rate and regular rhythm.     Pulses: Normal pulses.     Heart sounds: Normal heart sounds, S1 normal and S2 normal.  Pulmonary:     Effort: Pulmonary effort is normal.     Breath sounds: Normal breath sounds.  Skin:    General: Skin is warm and dry.  Neurological:     Mental Status: She is alert.     GCS: GCS eye subscore is 4. GCS verbal subscore is 5. GCS motor subscore is 6.  Psychiatric:        Attention and Perception: Attention normal.        Mood and Affect: Mood is anxious. Affect is tearful.        Speech: Speech normal.        Behavior: Behavior normal. Behavior is cooperative.     Assessment and Plan:   1. Chronic pain syndrome (Primary) Extensive chart review today I do not see records of past blood work, so I want to update/repeat this today I also want to send her to Sports Medicine for a better, "big picture", look at all of her symptoms -- she has debilitating foot and hip pain that is incredibly limiting her quality of life I encouraged her to limit NSAIDs as much as possible There has been multiple trials of Cymbalta  with mixed reviews from patient -- she is not interested in restarting this Amitriptyline  has been helpful for her insomnia/anxiety -- we will resume this today Consider  referral to Physical Medicine and Rehab - ANA - C-reactive protein - CYCLIC CITRUL PEPTIDE ANTIBODY, IGG/IGA - Rheumatoid factor - Sedimentation rate - HLA-B27 Antigen - CBC with Differential/Platelet - Comprehensive metabolic panel with GFR - Ambulatory referral to Sports Medicine  2. Insulin  resistance Updated A1c is well controlled - Hemoglobin A1c  3. Elevated lipoprotein(a) Updated lipid panel in good control Continue rosuvastatin  5 mg thrice weekly - Lipid panel  4. B12 deficiency Updated B12 is well controlled Continue current supplementation  5. Iron deficiency anemia  Iron panel shows some mild iron deficiency anemia  Advised via MyChart as follows: "I would like for this to be closer to 50. You are at increased risk of iron deficiency anemia due to your gastric surgery as well as frequent use of NSAIDs. I recommend consistent use of iron supplementation and avoidance of NSAIDs as much as possible. If you ever take a medication such as aleve /motrin/ibuprofen, you should always take a medication to protect your stomach such as generic Prilosec or Pepcid. I'm hoping we can get some better pain management strategies started to prevent further use of all NSAIDs. Let's see what sports medicine has to say, as well as remaining blood work.  You have never had a colonoscopy from what I can tell. What are your thoughts on pursuing this? I cannot also rule out gastrointestinal bleeding as a source of your decreased ferritin. This is important to rule out. I know that you are busy but this is very important to rule out." - IBC + Ferritin - Vitamin B12  6. Vitamin D  deficiency Mild vitamin D  deficiency  Recommend 1000-2000 IU daily with food - VITAMIN D  25 Hydroxy (Vit-D Deficiency, Fractures)  7. History of Roux-en-Y gastric bypass Reviewed history Compliant with supplementation  8. Essential hypertension, benign - CBC with Differential/Platelet - Comprehensive metabolic  panel with GFR -  olmesartan  (BENICAR ) 20 MG tablet; Take 1 tablet (20 mg total) by mouth daily.  Dispense: 90 tablet; Refill: 1  Above goal today No evidence of end-organ damage on my exam Recommend patient monitor home blood pressure at least a few times weekly Continue benicar  20 mg daily If home monitoring shows consistent elevation, or any symptom(s) develop, recommend reach out to us  for further advice on next steps  8. Osteopenia of neck of right femur Reviewed UpToDate on DEXA Continue calcium  and vitamin D  Repeat DEXA when due  9. Anxiety Ongoing Currently uncontrolled Hopefully improving pain management will improve symptom(s) Has done well with amitriptyline  in the past -- will restart 10 mg daily to help with sleep and mood May continue buspar  10 mg twice daily as needed or discontinue Consider talk therapy Follow up in 1-2 months  10. Calcification of left carotid artery Recommend carotid ultrasound -- patient will report back if this is something she would like to do   11. Obesity Continue efforts She is doing well currently with this She would be an excellent candidate for GLP-1 but insurance coverage is limiting this   I, Bernita Bristle, acting as a Neurosurgeon for Alexander Iba, [place]., have documented all relevant documentation on the behalf of Alexander Iba, [place], as directed by   while in the presence of Alexander Iba, [place].  I, Alexander Iba, [place], have reviewed all documentation for this visit. The documentation on 02/22/24 for the exam, diagnosis, procedures, and orders are all accurate and complete.  I spent a total of 65 minutes on this visit, today 02/22/24, which included reviewing previous notes from prior PCP, orthopedics, ordering tests, discussing plan of care with patient and using shared-decision making on next steps, refilling medications, and documenting the findings in the note.   Alexander Iba, PA-C

## 2024-02-23 ENCOUNTER — Other Ambulatory Visit: Payer: Self-pay | Admitting: Physician Assistant

## 2024-02-23 DIAGNOSIS — Z1211 Encounter for screening for malignant neoplasm of colon: Secondary | ICD-10-CM

## 2024-02-23 DIAGNOSIS — I6522 Occlusion and stenosis of left carotid artery: Secondary | ICD-10-CM

## 2024-02-23 MED ORDER — VITAMIN D (ERGOCALCIFEROL) 1.25 MG (50000 UNIT) PO CAPS: 50000.0000 [IU] | ORAL_CAPSULE | ORAL | 0 refills | Status: DC

## 2024-02-23 MED ORDER — PANTOPRAZOLE SODIUM 40 MG PO TBEC: 40.0000 mg | DELAYED_RELEASE_TABLET | Freq: Every day | ORAL | 3 refills | Status: AC

## 2024-02-24 LAB — CYCLIC CITRUL PEPTIDE ANTIBODY, IGG/IGA: Cyclic Citrullin Peptide Ab: 9 U (ref 0–19)

## 2024-02-25 LAB — ANTI-NUCLEAR AB-TITER (ANA TITER): ANA Titer 1: 1:80 {titer} — ABNORMAL HIGH

## 2024-02-25 LAB — HLA-B27 ANTIGEN: HLA-B27 Antigen: NEGATIVE

## 2024-02-25 LAB — RHEUMATOID FACTOR: Rheumatoid fact SerPl-aCnc: <10 [IU]/mL (ref <14)

## 2024-02-25 LAB — ANA: Anti Nuclear Antibody (ANA): POSITIVE — AB

## 2024-02-27 ENCOUNTER — Ambulatory Visit: Admitting: Orthopedic Surgery

## 2024-03-06 ENCOUNTER — Encounter (INDEPENDENT_AMBULATORY_CARE_PROVIDER_SITE_OTHER): Payer: Self-pay

## 2024-03-06 ENCOUNTER — Ambulatory Visit: Admitting: Family Medicine

## 2024-03-06 ENCOUNTER — Ambulatory Visit (INDEPENDENT_AMBULATORY_CARE_PROVIDER_SITE_OTHER)

## 2024-03-06 ENCOUNTER — Other Ambulatory Visit: Payer: Self-pay

## 2024-03-06 ENCOUNTER — Encounter: Payer: Self-pay | Admitting: Physician Assistant

## 2024-03-06 VITALS — BP 142/82 | HR 75 | Ht 63.0 in | Wt 225.0 lb

## 2024-03-06 DIAGNOSIS — M25562 Pain in left knee: Secondary | ICD-10-CM

## 2024-03-06 DIAGNOSIS — M79671 Pain in right foot: Secondary | ICD-10-CM

## 2024-03-06 DIAGNOSIS — M25561 Pain in right knee: Secondary | ICD-10-CM | POA: Diagnosis not present

## 2024-03-06 DIAGNOSIS — M79641 Pain in right hand: Secondary | ICD-10-CM

## 2024-03-06 DIAGNOSIS — M25552 Pain in left hip: Secondary | ICD-10-CM | POA: Diagnosis not present

## 2024-03-06 DIAGNOSIS — G8929 Other chronic pain: Secondary | ICD-10-CM

## 2024-03-06 DIAGNOSIS — M25551 Pain in right hip: Secondary | ICD-10-CM | POA: Diagnosis not present

## 2024-03-06 NOTE — Patient Instructions (Addendum)
 Thank you for coming in today.   Please get an Xray today before you leave   I've referred you to Physical Therapy here, at this office.  You can schedule your 1st visit at the check-out desk before you leave today.   Let me know, if not improving  OK to take 2 amitriptyline  at bedtime  Check back in 1 month

## 2024-03-06 NOTE — Progress Notes (Signed)
 Nicole Muck, PhD, LAT, ATC acting as a scribe for Nicole Juniper, MD.  Nicole Padilla is a 68 y.o. female who presents to [facility] at [facility] today for polyarthralgia. She reports previously having abnormal lab results and then saw rheum, dx w/ fibromyalgia, before 2015. She also note balance issue. Pt locates pain to Great toe on R foot, w/ shooting pain into ankle, bilat ankle, L knee, bilat hips, R 1st CMC joint.  The main source of pain is right foot and left knee.  She also notes right knee pain and bilateral hip pain.  She notes her foot is so painful she has to alter her gait which she thinks is exacerbating knee and hip pain.  She is taking over maximum dose of Tylenol and has been advised to reduce or discontinue naproxen  or ibuprofen given her CVD risk.  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   Hx of gastric bypass surgery.  Treatments tried: naproxen , prior knee steroid injection, weight loss  Dx testing: 02/22/24 Labs 07/26/22 DEXA scan  Pertinent review of systems: No fevers or chills  Relevant historical information: History of bariatric surgery   Exam:  BP (!) 142/82   Pulse 75   Ht 5\' 3"  (1.6 m)   Wt 225 lb (102.1 kg)   SpO2 96%   BMI 39.86 kg/m  General: Well Developed, well nourished, and in no acute distress.   MSK: Bilateral knees mild effusion normal motion.  Tender palpation medial joint line.  Left worse than right.  Right foot swelling dorsal forefoot.  Tender palpation medial dorsal tarsometatarsal joint area.  Decreased ankle motion.  Hips bilaterally normal motion    Lab and Radiology Results  Procedure: Real-time Ultrasound Guided Injection of right knee joint superior lateral patella space Device: Philips Affiniti 50G/GE Logiq Images permanently stored and available for review in PACS Verbal informed consent obtained.  Discussed risks and benefits of procedure. Warned about infection, bleeding,  hyperglycemia damage to structures among others. Patient expresses understanding and agreement Time-out conducted.   Noted no overlying erythema, induration, or other signs of local infection.   Skin prepped in a sterile fashion.   Local anesthesia: Topical Ethyl chloride.   With sterile technique and under real time ultrasound guidance: 40 mg of Kenalog and 2 mL of Marcaine  injected into knee joint. Fluid seen entering the joint capsule.   Completed without difficulty   Pain immediately resolved suggesting accurate placement of the medication.   Advised to call if fevers/chills, erythema, induration, drainage, or persistent bleeding.   Images permanently stored and available for review in the ultrasound unit.  Impression: Technically successful ultrasound guided injection.   Procedure: Real-time Ultrasound Guided Injection of right midfoot medial tarsometatarsal joint Device: Philips Affiniti 50G/GE Logiq Images permanently stored and available for review in PACS Verbal informed consent obtained.  Discussed risks and benefits of procedure. Warned about infection, bleeding, hyperglycemia damage to structures among others. Patient expresses understanding and agreement Time-out conducted.   Noted no overlying erythema, induration, or other signs of local infection.   Skin prepped in a sterile fashion.   Local anesthesia: Topical Ethyl chloride.   With sterile technique and under real time ultrasound guidance: 40 mg of Kenalog and 1 mL of lidocaine  injected into midfoot joint. Fluid seen entering the joint capsule.   Completed without difficulty   Pain immediately resolved suggesting accurate placement of the medication.   Advised to call if fevers/chills, erythema, induration,  drainage, or persistent bleeding.   Images permanently stored and available for review in the ultrasound unit.  Impression: Technically successful ultrasound guided injection.    X-ray images AP pelvis, bilateral  knees, right foot obtained today personally and independently interpreted.  AP pelvis: No severe osteoarthritis bilateral hips  Right knee: Moderate medial DJD  Left knee: Moderate medial DJD  Right foot: DJD midfoot.  No acute fractures.  No callus metatarsal bones to indicate a stress fracture.  Await formal radiology review    Assessment and Plan: 68 y.o. female with bilateral lateral hip pain, bilateral knee pain, right foot and ankle pain.  Major problem is her left knee and right foot.  Left knee pain due to DJD.  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  Both of these areas were injected today with pretty good results in clinic.  Plan to work on physical therapy.  She has to change her gait because of pain.  I think physical therapy would improve her gait and also give her the strength back at that she needs in her hip abductors and rotators for the gait that she has.  Plan to check back in a month.  Consider injection other areas.  Agree with amitriptyline  at bedtime.  Okay to increase to 20 mg at night.  Can continue to adjust medications based on symptoms.  We also talked about rheumatologic workup.  She had a recent rheumatologic workup that was positive only for an ANA with a mildly elevated titer of 1: 80.  All other rheumatology labs were normal.  I think it is unlikely that she has a rheumatologic problem.  She may have fibromyalgia but she also definitely has a bunch of interacting orthopedic issues that are producing her lower extremity pain.   PDMP not reviewed this encounter. Orders Placed This Encounter  Procedures   US  LIMITED JOINT SPACE STRUCTURES UP RIGHT(NO LINKED CHARGES)    Reason for Exam (SYMPTOM  OR DIAGNOSIS REQUIRED):   right hand pain    Preferred imaging location?:   [facility] Knee AP/LAT W/Sunrise Left    Standing Status:   Future    Number of Occurrences:   1    Expiration Date:   04/06/2024     Reason for Exam (SYMPTOM  OR DIAGNOSIS REQUIRED):   bilateral knee pain    Preferred imaging location?:   [facility]   DG Knee AP/LAT W/Sunrise Right    Standing Status:   Future    Number of Occurrences:   1    Expiration Date:   04/06/2024    Reason for Exam (SYMPTOM  OR DIAGNOSIS REQUIRED):   bilateral knee pain    Preferred imaging location?:   [facility]   DG Foot Complete Right    Standing Status:   Future    Number of Occurrences:   1    Expiration Date:   03/06/2025    Reason for Exam (SYMPTOM  OR DIAGNOSIS REQUIRED):   right foot pain    Preferred imaging location?:   [facility]   DG Pelvis 1-2 Views    Standing Status:   Future    Number of Occurrences:   1    Expiration Date:   03/06/2025    Reason for Exam (SYMPTOM  OR DIAGNOSIS REQUIRED):   bilateral hip pain    Preferred imaging location?:   [facility]   Ambulatory referral to Physical Therapy  Referral Priority:   Routine    Referral Type:   Physical Medicine    Referral Reason:   Specialty Services Required    Requested Specialty:   Physical Therapy    Number of Visits Requested:   1   No orders of the defined types were placed in this encounter.    Discussed warning signs or symptoms. Please see discharge instructions. Patient expresses understanding.   The above documentation has been reviewed and is accurate and complete Nicole Padilla, M.D.

## 2024-03-08 ENCOUNTER — Ambulatory Visit: Payer: Self-pay | Admitting: Family Medicine

## 2024-03-08 NOTE — Progress Notes (Signed)
Right knee x-ray shows medium to severe arthritis.

## 2024-03-08 NOTE — Progress Notes (Signed)
 Right foot x-ray shows arthritis at the 2nd and 3rd toe and heel spur.

## 2024-03-08 NOTE — Progress Notes (Signed)
Left knee x-ray shows medium to severe arthritis.

## 2024-03-08 NOTE — Progress Notes (Signed)
 Pelvis x-ray shows a little bit of arthritis.

## 2024-03-21 ENCOUNTER — Ambulatory Visit (HOSPITAL_COMMUNITY)
Admission: RE | Admit: 2024-03-21 | Discharge: 2024-03-21 | Disposition: A | Discharge status: Home / Self Care | Source: Ambulatory Visit | Attending: Physician Assistant | Admitting: Physician Assistant

## 2024-03-21 ENCOUNTER — Other Ambulatory Visit: Payer: Self-pay | Admitting: Physician Assistant

## 2024-03-21 DIAGNOSIS — I6522 Occlusion and stenosis of left carotid artery: Secondary | ICD-10-CM

## 2024-03-22 ENCOUNTER — Ambulatory Visit: Payer: Self-pay | Admitting: Physician Assistant

## 2024-03-27 NOTE — Therapy (Signed)
 OUTPATIENT PHYSICAL THERAPY EVALUATION   Patient Name: Nicole Padilla MRN: [id number] DOB:1956-09-23, 68 y.o., female Today's Date: 03/28/2024   END OF SESSION:  PT End of Session - 03/28/24 1127     Visit Number 1    Number of Visits 9    Date for PT Re-Evaluation 05/23/24    Authorization Type [employer] MCR    Progress Note Due on Visit 10    PT Start Time 1100    PT Stop Time 1200    PT Time Calculation (min) 60 min    Activity Tolerance Patient tolerated treatment well    Behavior During Therapy [facility] for tasks assessed/performed             Past Medical History:  Diagnosis Date   Abdominal wall abscess    Anxiety    Fibromyalgia 2006   H/O gastric bypass    HTN (hypertension)    Mixed hyperlipidemia 11/25/2013   Past Surgical History:  Procedure Laterality Date   APPENDECTOMY     EXPLORATORY LAPAROTOMY  2006   post bypass, had intestinal preforation   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     JEJUNOSTOMY REVISION  08/18/2005   salivary stones removed     TONSILLECTOMY     WISDOM TOOTH EXTRACTION     Patient Active Problem List   Diagnosis Date Noted   Anxiety 02/22/2024   Osteopenia of neck of right femur 02/22/2024   Elevated lipoprotein(a) 02/22/2024   Insulin  resistance 02/22/2024   COVID-19 11/04/2021   Rotator cuff impingement syndrome of left shoulder 06/18/2021   B12 deficiency 06/03/2021   Iron deficiency 06/03/2021   Obesity 07/17/2018   Essential hypertension, benign 12/31/2014   Vitamin D  deficiency 12/31/2014   Mixed hyperlipidemia 11/25/2013   Fibromyalgia    H/O gastric bypass     PCP: Alexander Iba, PA  REFERRING PROVIDER: Syliva Even, MD  REFERRING DIAG: Chronic pain of both knees; Right foot pain; Bilateral hip pain  THERAPY DIAG:  Chronic pain of left knee  Pain of both hip joints  Pain in left foot  Pain in right foot  Muscle weakness (generalized)  Rationale for Evaluation and  Treatment: Rehabilitation  ONSET DATE: Chronic (referral date 03/06/2024)   SUBJECTIVE:  SUBJECTIVE STATEMENT: Patient reports injury to left knee years ago and she now has arthritis in the left knee. She did have a cortisone injection with great results for 3 months. Then she got a second injection without any relief and the doctor did not want to continue with the injections. She also reports pain on top of her feet with walking because she feels like she was walking weird with her left knee. She tried a shoe with an arch but that made the pain worse. Recently she had another injection in the left knee and top of the left foot with good results. 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 She states she is an active person and would like to stay active, and works 3 jobs where she is on her feet for hours. She does report some balance issues with her left knee, and did have a fall when picking up some heavy boxes and leaned too far forward.   PERTINENT HISTORY: See PMH above  PAIN:  Are you having pain? Yes:  NPRS scale: 2/10 currently since injection Pain  location: Left knee Pain description: Constant grind, ratchet  Aggravating factors: Twisting on the left knee Relieving factors: Rest  NPRS scale: 2/10 Pain location: Bilateral feet Pain description: Excruciating, stabbing Aggravating factors: Walking Relieving factors: Rest  NPRS scale: 0/10 currently, 8/10 at worst Pain location: Bilateral hips Pain description: can't go anymore, ache Aggravating factors: Walking extended periods Relieving factors: Rest  PRECAUTIONS: Fall  RED FLAGS: None   WEIGHT BEARING RESTRICTIONS: No  FALLS:  Has patient fallen in last 6 months? Yes. Number of falls 1, was picking up something heavy   PLOF: Independent  PATIENT GOALS: Not to hurt   OBJECTIVE:  Note: Objective measures  were completed at Evaluation unless otherwise noted. PATIENT SURVEYS:  PSFS:1 Long distance walking > 1/2 mile: 0 Stairs (1 step at a time): 1 Gardening (dig holes with shovel): 0 Getting in and out of car: 3  COGNITION: Overall cognitive status: Within functional limits for tasks assessed     SENSATION: WFL  MUSCLE LENGTH: Calf flexibility deficit bilaterally  PALPATION: Tender to palpation at left knee medial joint line, bilateral greater trochanter and glute med region  LOWER EXTREMITY ROM:  Active ROM Right eval Left eval  Hip flexion    Hip extension    Hip abduction    Hip adduction    Hip internal rotation    Hip external rotation    Knee flexion 140 135  Knee extension 0 0  Ankle dorsiflexion    Ankle plantarflexion    Ankle inversion    Ankle eversion     (Blank rows = not tested)  LOWER EXTREMITY MMT:  MMT Right eval Left eval  Hip flexion 4 4  Hip extension    Hip abduction 4- 4-  Hip adduction    Hip internal rotation    Hip external rotation    Knee flexion 5 5  Knee extension 5 5  Ankle dorsiflexion 5 5  Ankle plantarflexion    Ankle inversion    Ankle eversion     (Blank rows = not tested)  FUNCTIONAL TESTS:  5 times sit to stand: 16 seconds SLS: < 5 sec each Tandem stance: able to maintain > 15 seconds  GAIT: Assistive device utilized: None Level of assistance: Complete Independence Comments: Trendelenburg bilaterally                                                                                                              TREATMENT [facility] Adult PT Treatment:                                                DATE: 03/28/2024 Sit to stand x 10 Calf stretch at counter x 20 sec each Standing hip abduction with red at knees x 10 each Tandem stance x 20 sec each  Discussed with patient her x-ray results with medial knee and patellofemoral arthritis, mid-foot arthritis, and minimal  arthritis of the hips. Explained her hip pain is  likely muscular and reasoning for why pain on side of the hips would be muscular in nature. Discussed goal of strengthening for muscles around the joints to take stress off joint surfaces and use of aquatic exercise to reduce joint stress with movement. Discussed possibility of using unloader brace on left knee if pain becomes worse. Patient asked about use of ionto for her foot pain so explained use of ionto with dexamethasone .   PATIENT EDUCATION:  Education details: Exam findings, POC, HEP Person educated: Patient Education method: Explanation, Demonstration, Tactile cues, Verbal cues, and Handouts Education comprehension: verbalized understanding, returned demonstration, verbal cues required, tactile cues required, and needs further education  HOME EXERCISE PROGRAM: Access Code: [email or web address]    ASSESSMENT: CLINICAL IMPRESSION: Patient is a 68 y.o. female who was seen today for physical therapy evaluation and treatment for chronic left knee, bilateral feet and hip pain. She does demonstrate good motion of the left knee but with some does have pain along medial joint line, gross strength deficits of her hips with gait deviation, bilateral calf tightness, and balance impairments. Her left knee pain does seem consistent with arthritic related knee pain while her hip pain seems more consistent gluteal related pain.  OBJECTIVE IMPAIRMENTS: Abnormal gait, decreased activity tolerance, decreased balance, decreased strength, and pain.   ACTIVITY LIMITATIONS: lifting, standing, squatting, sleeping, stairs, transfers, and locomotion level  PARTICIPATION LIMITATIONS: meal prep, cleaning, shopping, community activity, occupation, and yard work  PERSONAL FACTORS: Fitness, Past/current experiences, and Time since onset of injury/illness/exacerbation are also affecting patient's functional outcome.   REHAB POTENTIAL: Good  CLINICAL DECISION MAKING: Stable/uncomplicated  EVALUATION COMPLEXITY:  Low   GOALS: Goals reviewed with patient? Yes  SHORT TERM GOALS: Target date: 04/25/2024  Patient will be I with initial HEP in order to progress with therapy. Baseline: HEP provided at eval Goal status: INITIAL  2.  Patient will report knee and foot pain </= 2/10 and hip pain </= 5/10 wit extended periods of walking to reduce functional limitations Baseline: knee and foot pain 2/10, hip pain 8/10 Goal status: INITIAL  LONG TERM GOALS: Target date: 05/23/2024  Patient will be I with final HEP to maintain progress from PT. Baseline: HEP provided at eval Goal status: INITIAL  2.  Patient will report PSFS >/= 5 in order to indicate an improvement in functional status Baseline: 1 Goal status: INITIAL  3.  Patient will demonstrate hip strength >/= 4/5 MMT to improve her walking tolerance Baseline: grossly 4-/5 MMT Goal status: INITIAL  4.  Patient will demonstrate 5xSTS </= 10 seconds in order to indicate improved strength, mobility, and reduced fall risk Baseline: 16 seconds Goal status: INITIAL  5.  Patient will exhibit SLS >/= 10 seconds each to improve balance and stability with walking Baseline: < 5 seconds bilaterally Goal status: INITIAL   PLAN: PT FREQUENCY: 1x/week  PT DURATION: 8 weeks  PLANNED INTERVENTIONS: 97164- PT Re-evaluation, 97750- Physical Performance Testing, 97110-Therapeutic exercises, 97530- Therapeutic activity, 97112- Neuromuscular re-education, 97535- Self Care, 16109- Manual therapy, [phone]- Gait training, [phone]- Ionotophoresis 4mg /ml Dexamethasone , 91478 (1-2 muscles), 20561 (3+ muscles)- Dry Needling, Patient/Family education, Balance training, Stair training, Taping, Joint mobilization, Joint manipulation, Spinal manipulation, Spinal mobilization, Cryotherapy, and Moist heat  PLAN FOR NEXT SESSION: Review HEP and progress PRN, manual/mobs for left knee, continue progression of LE strengthening and balance    Leah Primus, PT, DPT, LAT,  ATC 03/28/24  3:48 PM Phone: [phone] Fax: [phone]

## 2024-03-28 ENCOUNTER — Encounter: Payer: Self-pay | Admitting: Physical Therapy

## 2024-03-28 ENCOUNTER — Ambulatory Visit: Admitting: Physical Therapy

## 2024-03-28 ENCOUNTER — Other Ambulatory Visit: Payer: Self-pay

## 2024-03-28 DIAGNOSIS — M25562 Pain in left knee: Secondary | ICD-10-CM

## 2024-03-28 DIAGNOSIS — M79672 Pain in left foot: Secondary | ICD-10-CM

## 2024-03-28 DIAGNOSIS — M79671 Pain in right foot: Secondary | ICD-10-CM | POA: Diagnosis not present

## 2024-03-28 DIAGNOSIS — M6281 Muscle weakness (generalized): Secondary | ICD-10-CM

## 2024-03-28 DIAGNOSIS — M25552 Pain in left hip: Secondary | ICD-10-CM

## 2024-03-28 DIAGNOSIS — M25551 Pain in right hip: Secondary | ICD-10-CM | POA: Diagnosis not present

## 2024-03-28 DIAGNOSIS — G8929 Other chronic pain: Secondary | ICD-10-CM

## 2024-03-28 NOTE — Patient Instructions (Signed)
 Access Code: [email or web address] URL: [email or web address] Date: 03/28/2024 Prepared by: Leah Primus  Exercises - Sit to Stand Without Arm Support  - 1 x daily - 3 sets - 10 reps - Standing Hip Abduction with Counter Support  - 1 x daily - 3 sets - 10 reps - Standing Gastroc Stretch at Counter  - 1 x daily - 3 reps - 20-30 seconds hold - Tandem Stance with Support  - 1 x daily - 3 sets - 3 reps - 20 seconds hold

## 2024-04-03 ENCOUNTER — Ambulatory Visit: Payer: Medicare Other | Admitting: Nurse Practitioner

## 2024-04-06 ENCOUNTER — Ambulatory Visit

## 2024-04-06 ENCOUNTER — Telehealth: Payer: Self-pay | Admitting: Physical Therapy

## 2024-04-06 ENCOUNTER — Ambulatory Visit: Admitting: Family Medicine

## 2024-04-06 VITALS — BP 148/88 | HR 72 | Ht 63.0 in | Wt 225.0 lb

## 2024-04-06 DIAGNOSIS — G8929 Other chronic pain: Secondary | ICD-10-CM

## 2024-04-06 DIAGNOSIS — M542 Cervicalgia: Secondary | ICD-10-CM

## 2024-04-06 NOTE — Telephone Encounter (Signed)
 Patient asked if she would be able to get another copy of the water aerobic exercises that were given to her? She said that she was doing them at the pool and someone took her paper.  Please advise.

## 2024-04-06 NOTE — Progress Notes (Unsigned)
   [name], PhD, LAT, ATC acting as a scribe for [name], MD.  Nicole Padilla is a 68 y.o. female who presents to [facility] at [facility] today for 62-month f/u bilat knee, hips, and R foot pain. Pt was last seen by Dr. Alease Hunter on 03/06/24 and was given a L knee and R midfoot steroid injections. Pt also referred to PT, completing 1 visit  Today, pt reports much relief from prior steroid injections. She has been super busy w/ work and pain has still been resolved. She notes PT feels like her hip musculature is weak. She is diligent w/ HEP.   She also notes neck pain x couple month. She was picking up a heavy box, lost her balance and fell. Pt locates pain to the L-side of her neck. No radicular pain. -n/t.  Dx testing: 03/06/24 Pelvis, R foot and R & L knee XR  02/22/24 Labs 07/26/22 DEXA scan  Pertinent review of systems: ***  Relevant historical information: ***   Exam:  There were no vitals taken for this visit. General: Well Developed, well nourished, and in no acute distress.   MSK: ***    Lab and Radiology Results No results found for this or any previous visit (from the past 72 hours). No results found.     Assessment and Plan: 68 y.o. female with ***   PDMP not reviewed this encounter. No orders of the defined types were placed in this encounter.  No orders of the defined types were placed in this encounter.    Discussed warning signs or symptoms. Please see discharge instructions. Patient expresses understanding.   ***

## 2024-04-06 NOTE — Patient Instructions (Addendum)
 Thank you for coming in today.   Continue physical therapy  Please get an Xray today before you leave   [email or web address]  Access Code: [email or web address]   I will be out of the office for about 6 weeks, starting August 1st.

## 2024-04-16 ENCOUNTER — Ambulatory Visit: Payer: Self-pay | Admitting: Family Medicine

## 2024-04-16 NOTE — Progress Notes (Signed)
 Cervical spine x-ray shows arthritis.

## 2024-04-18 ENCOUNTER — Encounter: Admitting: Physical Therapy

## 2024-04-19 ENCOUNTER — Ambulatory Visit (INDEPENDENT_AMBULATORY_CARE_PROVIDER_SITE_OTHER): Admitting: Physician Assistant

## 2024-04-19 ENCOUNTER — Encounter: Payer: Self-pay | Admitting: Physician Assistant

## 2024-04-19 VITALS — BP 114/68 | HR 69 | Temp 98.2°F | Ht 63.0 in | Wt 221.0 lb

## 2024-04-19 DIAGNOSIS — E669 Obesity, unspecified: Secondary | ICD-10-CM

## 2024-04-19 DIAGNOSIS — G894 Chronic pain syndrome: Secondary | ICD-10-CM

## 2024-04-19 DIAGNOSIS — E782 Mixed hyperlipidemia: Secondary | ICD-10-CM

## 2024-04-19 DIAGNOSIS — G4700 Insomnia, unspecified: Secondary | ICD-10-CM

## 2024-04-19 DIAGNOSIS — I10 Essential (primary) hypertension: Secondary | ICD-10-CM

## 2024-04-19 DIAGNOSIS — F419 Anxiety disorder, unspecified: Secondary | ICD-10-CM

## 2024-04-19 DIAGNOSIS — F32A Depression, unspecified: Secondary | ICD-10-CM | POA: Insufficient documentation

## 2024-04-19 DIAGNOSIS — C44329 Squamous cell carcinoma of skin of other parts of face: Secondary | ICD-10-CM | POA: Insufficient documentation

## 2024-04-19 MED ORDER — NALTREXONE HCL 50 MG PO TABS: 25.0000 mg | ORAL_TABLET | Freq: Every day | ORAL | 2 refills | Status: DC

## 2024-04-19 MED ORDER — VITAMIN D (ERGOCALCIFEROL) 1.25 MG (50000 UNIT) PO CAPS: 50000.0000 [IU] | ORAL_CAPSULE | ORAL | 0 refills | Status: DC

## 2024-04-19 MED ORDER — ROSUVASTATIN CALCIUM 5 MG PO TABS: ORAL_TABLET | ORAL | 1 refills | Status: DC

## 2024-04-19 MED ORDER — BUPROPION HCL ER (XL) 150 MG PO TB24: 150.0000 mg | ORAL_TABLET | Freq: Every day | ORAL | 2 refills | Status: DC

## 2024-04-19 NOTE — Progress Notes (Signed)
 Nicole Padilla is a 68 y.o. female here for a follow up of a pre-existing problem.  History of Present Illness:   Chief Complaint  Patient presents with   Medical Management of Chronic Issues    Chronic pain, Hypertension, Insulin  resistance, Hyperlipidemia    HPI Hyperlipidemia Patient reports compliance and good tolerance of Rosuvastatin  5 mg 3 days per week.  No further concerns.  HTN  Currently managed with Olmesartan  20 mg daily. Tolerates this well. BP is 114/68 today Patient denies chest pain, SOB, blurred vision, dizziness, unusual headaches, lower leg swelling. Patient is compliant with medication. Denies excessive caffeine intake, stimulant usage, excessive alcohol intake, or increase in salt  Arthritis/ Chronic Pain  Currently followed by sports medicine an She is also undergoing PT which they believe that her hip musculature is weak and must strengthen it.  She has significantly decreased her NSAID's intake per recommendation.   Insomnia Patient states that she has not been having adequate sleep over the past few weeks due to work.  She has been going to bed from 4-7. This will last for about one more week.  Reports typically taking amitriptyline  10 mg to aid in staying asleep.   Anxiety  She is currently taking Buspar  5 mg daily. Tolerates this well with no side effects.   Insulin  Resistance/ Weight Management  Patient is unable to use GLP-1s due to her insurance.  She reports gaining around 6 pounds during her recent trip. She often has to follow a very strict diet to loose weight.  She has been trying a trial of chewing gum to aid in GLP1 stimulation. She did see some improvement with this.  She was previously on phentermine  and Wellbutrin  as well. Agreeable to starting Contrave.  Reports mainly struggling with her protein intake.   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     JEJUNOSTOMY REVISION  08/18/2005   salivary stones removed     TONSILLECTOMY     WISDOM TOOTH EXTRACTION      Family History  Problem Relation Age of Onset   Osteoporosis Mother    Emphysema Mother    Alzheimer's disease Father    Heart disease Father    Multiple sclerosis Sister     Allergies  Allergen Reactions   E-Mycin [Erythromycin]     GI upset    Current Medications:   Current Outpatient Medications:    amitriptyline  (ELAVIL ) 10 MG tablet, Take 1 tablet (10 mg total) by mouth at bedtime., Disp: 90 tablet, Rfl: 1   ascorbic acid (VITAMIN C) 500 MG tablet, Take 500 mg by mouth daily., Disp: , Rfl:    buPROPion  (WELLBUTRIN  XL) 150 MG 24 hr tablet, Take 1 tablet (150 mg total) by mouth daily., Disp: 30 tablet, Rfl: 2   busPIRone  (BUSPAR )  10 MG tablet, Take 10 mg by mouth daily., Disp: , Rfl:    Calcium  Carbonate-Vit D-Min (CALCIUM  1200 PO), Take by mouth daily., Disp: , Rfl:    Cholecalciferol (VITAMIN D -3) 125 MCG (5000 UT) TABS, Take 5,000 Int'l Units/day by mouth daily., Disp: , Rfl:    cyanocobalamin  (VITAMIN B12) 1000 MCG tablet, Take 1,000 mcg by mouth daily., Disp: , Rfl:    Fexofenadine HCl (ALLEGRA PO), Take by mouth. (Patient taking differently: Take by mouth as needed.), Disp: , Rfl:    fluticasone  (FLONASE ) 50 MCG/ACT nasal spray, Place 1 spray into both nostrils daily., Disp: 15.8 mL, Rfl: 0   Magnesium Glycinate 100 MG CAPS, Take 100 Int'l Units/hr by mouth in the morning, at noon, and  at bedtime. 300 once a day, Disp: , Rfl:    Multiple Vitamins-Minerals (MULTIVITAMIN WITH MINERALS) tablet, Take 1 tablet by mouth daily., Disp: , Rfl:    naltrexone (DEPADE) 50 MG tablet, Take 0.5 tablets (25 mg total) by mouth daily., Disp: 15 tablet, Rfl: 2   olmesartan  (BENICAR ) 20 MG tablet, Take 1 tablet (20 mg total) by mouth daily., Disp: 90 tablet, Rfl: 1   pantoprazole  (PROTONIX ) 40 MG tablet, Take 1 tablet (40 mg total) by mouth daily., Disp: 30 tablet, Rfl: 3   Vitamin D , Ergocalciferol , (DRISDOL ) 1.25 MG (50000 UNIT) CAPS capsule, Take 1 capsule (50,000 Units total) by mouth every 7 (seven) days., Disp: 4 capsule, Rfl: 0   rosuvastatin  (CRESTOR ) 5 MG tablet, Take 1 tablet daily, Disp: 90 tablet, Rfl: 1   Review of Systems:   Review of Systems  Psychiatric/Behavioral:  The patient is nervous/anxious and has insomnia.   Negative unless otherwise specified per HPI.  Vitals:   Vitals:   04/19/24 1044  BP: 114/68  Pulse: 69  Temp: 98.2 F (36.8 C)  TempSrc: Temporal  SpO2: 96%  Weight: 221 lb (100.2 kg)  Height: 5' 3 (1.6 m)     Body mass index is 39.15 kg/m.  Physical Exam:   Physical Exam Vitals and nursing note reviewed.  Constitutional:      General: She is not in acute distress.    Appearance: She is well-developed. She is not ill-appearing or toxic-appearing.  Cardiovascular:     Rate and Rhythm: Normal rate and regular rhythm.     Pulses: Normal pulses.     Heart sounds: Normal heart sounds, S1 normal and S2 normal.  Pulmonary:     Effort: Pulmonary effort is normal.     Breath sounds: Normal breath sounds.  Skin:    General: Skin is warm and dry.  Neurological:     Mental Status: She is alert.     GCS: GCS eye subscore is 4. GCS verbal subscore is 5. GCS motor subscore is 6.  Psychiatric:        Speech: Speech normal.        Behavior: Behavior normal. Behavior is cooperative.     Assessment and Plan:   Essential hypertension,  benign Normotensive Continue olmesartan  20 mg daily Follow up in 3-6 month(s)   Obesity, unspecified class, unspecified obesity type, unspecified whether serious comorbidity present Will trial Wellbutrin  150 mg xl + Naltrexone 25 mg Follow up in 3 month(s), sooner if concerns  Chronic pain syndrome Seeing sports medicine Doing well  Continue efforts at physical therapy and regular movement  Insomnia, unspecified type Overall stable per patient Continue amitriptyline  10 mg daily  Mixed hyperlipidemia Continue crestor  but will increase to 5 mg  daily for better lipid management   Anxiety Overall stable per patient Continue as needed buspar    Lucie Buttner, PA-C  I,Safa M Kadhim,acting as a scribe for Lucie Buttner, PA.,have documented all relevant documentation on the behalf of Lucie Buttner, PA,as directed by  Lucie Buttner, PA while in the presence of Lucie Buttner, [place].   I, Lucie Buttner, [place], have reviewed all documentation for this visit. The documentation on 04/19/24 for the exam, diagnosis, procedures, and orders are all accurate and complete.

## 2024-04-24 ENCOUNTER — Ambulatory Visit: Admitting: Physician Assistant

## 2024-04-25 ENCOUNTER — Other Ambulatory Visit: Payer: Self-pay

## 2024-04-25 ENCOUNTER — Encounter: Payer: Self-pay | Admitting: Physical Therapy

## 2024-04-25 ENCOUNTER — Ambulatory Visit: Admitting: Physical Therapy

## 2024-04-25 DIAGNOSIS — M25551 Pain in right hip: Secondary | ICD-10-CM

## 2024-04-25 DIAGNOSIS — M25552 Pain in left hip: Secondary | ICD-10-CM

## 2024-04-25 DIAGNOSIS — M79671 Pain in right foot: Secondary | ICD-10-CM | POA: Diagnosis not present

## 2024-04-25 DIAGNOSIS — M79672 Pain in left foot: Secondary | ICD-10-CM | POA: Diagnosis not present

## 2024-04-25 DIAGNOSIS — M6281 Muscle weakness (generalized): Secondary | ICD-10-CM

## 2024-04-25 DIAGNOSIS — M25562 Pain in left knee: Secondary | ICD-10-CM

## 2024-04-25 DIAGNOSIS — G8929 Other chronic pain: Secondary | ICD-10-CM

## 2024-04-25 NOTE — Therapy (Signed)
 OUTPATIENT PHYSICAL THERAPY TREATMENT   Patient Name: Nicole Padilla MRN: [id number] DOB:[birth year], 68 y.o., female Today's Date: 04/25/2024   END OF SESSION:  PT End of Session - 04/25/24 1256     Visit Number 2    Number of Visits 9    Date for PT Re-Evaluation 05/23/24    Authorization Type [employer] MCR    Authorization Time Period 03/28/2024 - 06/20/2024    Authorization - Visit Number 2    Authorization - Number of Visits 13    Progress Note Due on Visit 10    PT Start Time 1300    PT Stop Time 1340    PT Time Calculation (min) 40 min    Activity Tolerance Patient tolerated treatment well    Behavior During Therapy [facility] for tasks assessed/performed           Past Medical History:  Diagnosis Date   Abdominal wall abscess    Anxiety    Fibromyalgia 2006   H/O gastric bypass    HTN (hypertension)    Mixed hyperlipidemia 11/25/2013   Past Surgical History:  Procedure Laterality Date   APPENDECTOMY     EXPLORATORY LAPAROTOMY  2006   post bypass, had intestinal preforation   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     JEJUNOSTOMY REVISION  08/18/2005   salivary stones removed     TONSILLECTOMY     WISDOM TOOTH EXTRACTION     Patient Active Problem List   Diagnosis Date Noted   Squamous cell carcinoma of forehead 04/19/2024   Depressive disorder 04/19/2024   Anxiety 02/22/2024   Osteopenia of neck of right femur 02/22/2024   Elevated lipoprotein(a) 02/22/2024   Insulin  resistance 02/22/2024   COVID-19 11/04/2021   Rotator cuff impingement syndrome of left shoulder 06/18/2021   B12 deficiency 06/03/2021   Iron deficiency 06/03/2021   Obesity 07/17/2018   Essential hypertension, benign 12/31/2014   Vitamin D  deficiency 12/31/2014   Mixed hyperlipidemia 11/25/2013   Fibromyalgia    H/O gastric bypass     PCP: Job Lukes, PA  REFERRING PROVIDER: Joane Artist RAMAN, MD  REFERRING DIAG: Chronic pain of both knees; Right foot pain;  Bilateral hip pain  THERAPY DIAG:  Chronic pain of left knee  Pain of both hip joints  Pain in left foot  Pain in right foot  Muscle weakness (generalized)  Rationale for Evaluation and Treatment: Rehabilitation  ONSET DATE: Chronic (referral date 03/06/2024)   SUBJECTIVE:  SUBJECTIVE STATEMENT: Patient reports she can't tell much difference in how she feels but she hasn't gotten any notifications on her watch that she is going to fall which is better. She still feels like if she walks a long way her hips will bother her.   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 She did have a cortisone injection with great results for 3 months. Then she got a second injection without any relief and the doctor did not want to continue with the injections. She also reports pain on top of her feet with walking because she feels like she was walking weird with her left knee. She tried a shoe with an arch but that made the pain worse. Recently she had another injection in the left knee and top of the left foot with good results. 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 She states she is an active person and would like to stay active, and works 3 jobs where she is on her feet for hours. She does report some balance issues with her left knee, and did have a fall when picking up some heavy boxes and leaned too far forward.   PERTINENT HISTORY: See PMH above  PAIN:  Are you having pain? Yes:  NPRS scale: 2/10 currently since injection Pain location: Left knee Pain description: Constant grind, ratchet  Aggravating factors: Twisting on the left knee Relieving factors: Rest  NPRS scale: 2/10 Pain location: Bilateral feet Pain description: Excruciating, stabbing Aggravating factors: Walking Relieving factors: Rest  NPRS scale: 0/10 currently,  8/10 at worst Pain location: Bilateral hips Pain description: can't go anymore, ache Aggravating factors: Walking extended periods Relieving factors: Rest  PRECAUTIONS: Fall  RED FLAGS: None   WEIGHT BEARING RESTRICTIONS: No  FALLS:  Has patient fallen in last 6 months? Yes. Number of falls 1, was picking up something heavy   PLOF: Independent  PATIENT GOALS: Not to hurt   OBJECTIVE:  Note: Objective measures were completed at Evaluation unless otherwise noted. PATIENT SURVEYS:  PSFS:1 Long distance walking > 1/2 mile: 0 Stairs (1 step at a time): 1 Gardening (dig holes with shovel): 0 Getting in and out of car: 3  MUSCLE LENGTH: Calf flexibility deficit bilaterally  PALPATION: Tender to palpation at left knee medial joint line, bilateral greater trochanter and glute med region  LOWER EXTREMITY ROM:  Active ROM Right eval Left eval  Hip flexion    Hip extension    Hip abduction    Hip adduction    Hip internal rotation    Hip external rotation    Knee flexion 140 135  Knee extension 0 0  Ankle dorsiflexion    Ankle plantarflexion    Ankle inversion    Ankle eversion     (Blank rows = not tested)  LOWER EXTREMITY MMT:  MMT Right eval Left eval  Hip flexion 4 4  Hip extension    Hip abduction 4- 4-  Hip adduction    Hip internal rotation    Hip external rotation    Knee flexion 5 5  Knee extension 5 5  Ankle dorsiflexion 5 5  Ankle plantarflexion    Ankle inversion    Ankle eversion     (Blank rows = not tested)  FUNCTIONAL TESTS:  5 times sit to stand: 16 seconds SLS: < 5 sec each  04/25/2024: left: 9 seconds, right: 4 seconds Tandem stance: able to maintain > 15 seconds  GAIT: Assistive device utilized: None Level of assistance: Complete Independence Comments: Trendelenburg bilaterally                                                                                                              TREATMENT [facility] Adult PT Treatment:  DATE: 04/25/2024 Recumbent bike L3 x 5 min to improve endurance and workload capacity Sit to stand 2 x 10 LAQ with 4# 3 x 15 each Seated hamstring curl with red 2 x 15 each Standing hip abduction with red at knees 3 x 15 each Standing heel raises 3 x 15 Tandem stance 2 x 20 sec each Romberg on Airex pad with head turn and nods x 10 each SLS holding as long as she could x 3 rounds  PATIENT EDUCATION:  Education details: HEP Person educated: Patient Education method: Programmer, multimedia, Demonstration, Tactile cues, Verbal cues, and Handouts Education comprehension: verbalized understanding, returned demonstration, verbal cues required, tactile cues required, and needs further education  HOME EXERCISE PROGRAM: Access Code: [email or web address]    ASSESSMENT: CLINICAL IMPRESSION: Patient tolerated therapy well with no adverse effects. Therapy focused primarily on progressing her hip and LE strengthening, and continuing with her balance training. She was able to progress to using foam surface and does demonstrate a slight improvement with her SLS. She notes continued hip pain with her walking long distances but has felt more steady since last visit. She did not report any pain with therapy. No changes made to HEP, provided her with new handouts for both land and aquatic exercises. Patient would benefit from continued skilled PT to progress mobility and strength in order to reduce pain and maximize functional ability.   Eval: Patient is a 68 y.o. female who was seen today for physical therapy evaluation and treatment for chronic left knee, bilateral feet and hip pain. She does demonstrate good motion of the left knee but with some does have pain along medial joint line, gross strength deficits of her hips with gait deviation, bilateral calf tightness, and balance impairments. Her left knee pain does seem consistent with arthritic related knee pain while her hip pain seems  more consistent gluteal related pain.  OBJECTIVE IMPAIRMENTS: Abnormal gait, decreased activity tolerance, decreased balance, decreased strength, and pain.   ACTIVITY LIMITATIONS: lifting, standing, squatting, sleeping, stairs, transfers, and locomotion level  PARTICIPATION LIMITATIONS: meal prep, cleaning, shopping, community activity, occupation, and yard work  PERSONAL FACTORS: Fitness, Past/current experiences, and Time since onset of injury/illness/exacerbation are also affecting patient's functional outcome.    GOALS: Goals reviewed with patient? Yes  SHORT TERM GOALS: Target date: 04/25/2024  Patient will be I with initial HEP in order to progress with therapy. Baseline: HEP provided at eval 04/25/2024: independent with initial HEP Goal status: MET  2.  Patient will report knee and foot pain </= 2/10 and hip pain </= 5/10 wit extended periods of walking to reduce functional limitations Baseline: knee and foot pain 2/10, hip pain 8/10 04/25/2024: 2/10 knee and foot, continue increased pain with hips while walking Goal status: ONGOING  LONG TERM GOALS: Target date: 05/23/2024  Patient will be I with final HEP to maintain progress from PT. Baseline: HEP provided at eval Goal status: INITIAL  2.  Patient will report PSFS >/= 5 in order to indicate an improvement in functional status Baseline: 1 Goal status: INITIAL  3.  Patient will demonstrate hip strength >/= 4/5 MMT to improve her walking tolerance Baseline: grossly 4-/5 MMT Goal status: INITIAL  4.  Patient will demonstrate 5xSTS </= 10 seconds in order to indicate improved strength, mobility, and reduced fall risk Baseline: 16 seconds Goal status: INITIAL  5.  Patient will exhibit SLS >/= 10 seconds each to improve balance and stability with walking Baseline: < 5 seconds bilaterally Goal status: INITIAL  PLAN: PT FREQUENCY: 1x/week  PT DURATION: 8 weeks  PLANNED INTERVENTIONS: 97164- PT Re-evaluation, 97750-  Physical Performance Testing, 97110-Therapeutic exercises, 97530- Therapeutic activity, 97112- Neuromuscular re-education, 97535- Self Care, 02859- Manual therapy, [phone]- Gait training, [phone]- Ionotophoresis 4mg /ml Dexamethasone , 79439 (1-2 muscles), 20561 (3+ muscles)- Dry Needling, Patient/Family education, Balance training, Stair training, Taping, Joint mobilization, Joint manipulation, Spinal manipulation, Spinal mobilization, Cryotherapy, and Moist heat  PLAN FOR NEXT SESSION: Review HEP and progress PRN, manual/mobs for left knee, continue progression of LE strengthening and balance    Elaine Daring, PT, DPT, LAT, ATC 04/25/24  1:45 PM Phone: [phone] Fax: [phone]

## 2024-04-26 ENCOUNTER — Encounter: Payer: Self-pay | Admitting: Physician Assistant

## 2024-05-02 ENCOUNTER — Other Ambulatory Visit: Payer: Self-pay

## 2024-05-02 ENCOUNTER — Encounter: Payer: Self-pay | Admitting: Physical Therapy

## 2024-05-02 ENCOUNTER — Ambulatory Visit: Admitting: Physical Therapy

## 2024-05-02 DIAGNOSIS — M79671 Pain in right foot: Secondary | ICD-10-CM | POA: Diagnosis not present

## 2024-05-02 DIAGNOSIS — M25551 Pain in right hip: Secondary | ICD-10-CM | POA: Diagnosis not present

## 2024-05-02 DIAGNOSIS — M25562 Pain in left knee: Secondary | ICD-10-CM

## 2024-05-02 DIAGNOSIS — M79672 Pain in left foot: Secondary | ICD-10-CM

## 2024-05-02 DIAGNOSIS — G8929 Other chronic pain: Secondary | ICD-10-CM

## 2024-05-02 DIAGNOSIS — M25552 Pain in left hip: Secondary | ICD-10-CM

## 2024-05-02 DIAGNOSIS — M6281 Muscle weakness (generalized): Secondary | ICD-10-CM

## 2024-05-02 NOTE — Therapy (Signed)
 OUTPATIENT PHYSICAL THERAPY TREATMENT   Patient Name: RONESHIA DREW MRN: [id number] DOB:09/27/1956, 68 y.o., female Today's Date: 05/02/2024   END OF SESSION:  PT End of Session - 05/02/24 1334     Visit Number 3    Number of Visits 9    Date for PT Re-Evaluation 05/23/24    Authorization Type [employer] MCR    Authorization Time Period 03/28/2024 - 06/20/2024    Authorization - Visit Number 3    Authorization - Number of Visits 13    Progress Note Due on Visit 10    PT Start Time 1300    PT Stop Time 1340    PT Time Calculation (min) 40 min    Activity Tolerance Patient tolerated treatment well    Behavior During Therapy [facility] for tasks assessed/performed            Past Medical History:  Diagnosis Date   Abdominal wall abscess    Anxiety    Fibromyalgia 2006   H/O gastric bypass    HTN (hypertension)    Mixed hyperlipidemia 11/25/2013   Past Surgical History:  Procedure Laterality Date   APPENDECTOMY     EXPLORATORY LAPAROTOMY  2006   post bypass, had intestinal preforation   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     JEJUNOSTOMY REVISION  08/18/2005   salivary stones removed     TONSILLECTOMY     WISDOM TOOTH EXTRACTION     Patient Active Problem List   Diagnosis Date Noted   Squamous cell carcinoma of forehead 04/19/2024   Depressive disorder 04/19/2024   Anxiety 02/22/2024   Osteopenia of neck of right femur 02/22/2024   Elevated lipoprotein(a) 02/22/2024   Insulin  resistance 02/22/2024   COVID-19 11/04/2021   Rotator cuff impingement syndrome of left shoulder 06/18/2021   B12 deficiency 06/03/2021   Iron deficiency 06/03/2021   Obesity 07/17/2018   Essential hypertension, benign 12/31/2014   Vitamin D  deficiency 12/31/2014   Mixed hyperlipidemia 11/25/2013   Fibromyalgia    H/O gastric bypass     PCP: Job Lukes, PA  REFERRING PROVIDER: Joane Artist RAMAN, MD  REFERRING DIAG: Chronic pain of both knees; Right foot pain;  Bilateral hip pain  THERAPY DIAG:  Chronic pain of left knee  Pain of both hip joints  Pain in left foot  Pain in right foot  Muscle weakness (generalized)  Rationale for Evaluation and Treatment: Rehabilitation  ONSET DATE: Chronic (referral date 03/06/2024)   SUBJECTIVE:  SUBJECTIVE STATEMENT: Patient reports after last visit her left knee and right foot began hurting again. The left knee is feeling better but the left foot still hurts  Eval: Patient reports injury to left knee years ago and she now has arthritis in the left knee. She did have a cortisone injection with great results for 3 months. Then she got a second injection without any relief and the doctor did not want to continue with the injections. She also reports pain on top of her feet with walking because she feels like she was walking weird with her left knee. She tried a shoe with an arch but that made the pain worse. Recently she had another injection in the left knee and top of the left foot with good results. 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 She states she is an active person and  would like to stay active, and works 3 jobs where she is on her feet for hours. She does report some balance issues with her left knee, and did have a fall when picking up some heavy boxes and leaned too far forward.   PERTINENT HISTORY: See PMH above  PAIN:  Are you having pain? Yes:  NPRS scale: 2/10 currently since injection Pain location: Left knee Pain description: Constant grind, ratchet  Aggravating factors: Twisting on the left knee Relieving factors: Rest  NPRS scale: 4/10 Pain location: Right foot Pain description: Excruciating, stabbing Aggravating factors: Walking Relieving factors: Rest  NPRS scale: 0/10 currently, 8/10 at worst Pain location: Bilateral hips Pain description: can't go anymore,  ache Aggravating factors: Walking extended periods Relieving factors: Rest  PRECAUTIONS: Fall  RED FLAGS: None   WEIGHT BEARING RESTRICTIONS: No  FALLS:  Has patient fallen in last 6 months? Yes. Number of falls 1, was picking up something heavy   PLOF: Independent  PATIENT GOALS: Not to hurt   OBJECTIVE:  Note: Objective measures were completed at Evaluation unless otherwise noted. PATIENT SURVEYS:  PSFS:1 Long distance walking > 1/2 mile: 0 Stairs (1 step at a time): 1 Gardening (dig holes with shovel): 0 Getting in and out of car: 3  MUSCLE LENGTH: Calf flexibility deficit bilaterally  PALPATION: Tender to palpation at left knee medial joint line, bilateral greater trochanter and glute med region  LOWER EXTREMITY ROM:  Active ROM Right eval Left eval  Hip flexion    Hip extension    Hip abduction    Hip adduction    Hip internal rotation    Hip external rotation    Knee flexion 140 135  Knee extension 0 0  Ankle dorsiflexion    Ankle plantarflexion    Ankle inversion    Ankle eversion     (Blank rows = not tested)  LOWER EXTREMITY MMT:  MMT Right eval Left eval  Hip flexion 4 4  Hip extension    Hip abduction 4- 4-  Hip adduction    Hip internal rotation    Hip external rotation    Knee flexion 5 5  Knee extension 5 5  Ankle dorsiflexion 5 5  Ankle plantarflexion    Ankle inversion    Ankle eversion     (Blank rows = not tested)  FUNCTIONAL TESTS:  5 times sit to stand: 16 seconds SLS: < 5 sec each  04/25/2024: left: 9 seconds, right: 4 seconds Tandem stance: able to maintain > 15 seconds  GAIT: Assistive device utilized: None Level of assistance: Complete Independence Comments: Trendelenburg bilaterally                                                                                                              TREATMENT [facility] Adult PT Treatment:  DATE: 05/02/2024 Intermetatarsal and  tarsal mobs on right Passive toe flexion/extension and calf stretch Seated towel scrunches 2 x 20 Seated toe yoga 2 x 20 Longsitting calf stretch 3 x 30 sec each  Discussed modifying her HEP to perform balance exercises in the pool for less stress on feet as these seem to be aggravating positions for the right foot.   PATIENT EDUCATION:  Education details: HEP update Person educated: Patient Education method: Explanation, Demonstration, Tactile cues, Verbal cues, and Handouts Education comprehension: verbalized understanding, returned demonstration, verbal cues required, tactile cues required, and needs further education  HOME EXERCISE PROGRAM: Access Code: [email or web address]    ASSESSMENT: CLINICAL IMPRESSION: Patient tolerated therapy well with no adverse effects. She did report increase in right foot pain with weight bearing and exhibits mild edema across the right mid-foot compared to left. Performed manual for foot mobility and modified her HEP to reduce weight bearing exercises which seem to aggravate the right foot. She will perform more balance exercises in the pool with reduced amount of weight bearing. Patient would benefit from continued skilled PT to progress mobility and strength in order to reduce pain and maximize functional ability.   Eval: Patient is a 68 y.o. female who was seen today for physical therapy evaluation and treatment for chronic left knee, bilateral feet and hip pain. She does demonstrate good motion of the left knee but with some does have pain along medial joint line, gross strength deficits of her hips with gait deviation, bilateral calf tightness, and balance impairments. Her left knee pain does seem consistent with arthritic related knee pain while her hip pain seems more consistent gluteal related pain.  OBJECTIVE IMPAIRMENTS: Abnormal gait, decreased activity tolerance, decreased balance, decreased strength, and pain.   ACTIVITY LIMITATIONS: lifting, standing,  squatting, sleeping, stairs, transfers, and locomotion level  PARTICIPATION LIMITATIONS: meal prep, cleaning, shopping, community activity, occupation, and yard work  PERSONAL FACTORS: Fitness, Past/current experiences, and Time since onset of injury/illness/exacerbation are also affecting patient's functional outcome.    GOALS: Goals reviewed with patient? Yes  SHORT TERM GOALS: Target date: 04/25/2024  Patient will be I with initial HEP in order to progress with therapy. Baseline: HEP provided at eval 04/25/2024: independent with initial HEP Goal status: MET  2.  Patient will report knee and foot pain </= 2/10 and hip pain </= 5/10 wit extended periods of walking to reduce functional limitations Baseline: knee and foot pain 2/10, hip pain 8/10 04/25/2024: 2/10 knee and foot, continue increased pain with hips while walking Goal status: ONGOING  LONG TERM GOALS: Target date: 05/23/2024  Patient will be I with final HEP to maintain progress from PT. Baseline: HEP provided at eval Goal status: INITIAL  2.  Patient will report PSFS >/= 5 in order to indicate an improvement in functional status Baseline: 1 Goal status: INITIAL  3.  Patient will demonstrate hip strength >/= 4/5 MMT to improve her walking tolerance Baseline: grossly 4-/5 MMT Goal status: INITIAL  4.  Patient will demonstrate 5xSTS </= 10 seconds in order to indicate improved strength, mobility, and reduced fall risk Baseline: 16 seconds Goal status: INITIAL  5.  Patient will exhibit SLS >/= 10 seconds each to improve balance and stability with walking Baseline: < 5 seconds bilaterally Goal status: INITIAL   PLAN: PT FREQUENCY: 1x/week  PT DURATION: 8 weeks  PLANNED INTERVENTIONS: 97164- PT Re-evaluation, 97750- Physical Performance Testing, 97110-Therapeutic exercises, 97530- Therapeutic activity, [id number]- Neuromuscular re-education, 97535- Self Care, 02859- Manual therapy, 02883-  Gait training, 02966-  Ionotophoresis 4mg /ml Dexamethasone , 20560 (1-2 muscles), 20561 (3+ muscles)- Dry Needling, Patient/Family education, Balance training, Stair training, Taping, Joint mobilization, Joint manipulation, Spinal manipulation, Spinal mobilization, Cryotherapy, and Moist heat  PLAN FOR NEXT SESSION: Review HEP and progress PRN, manual/mobs for left knee, continue progression of LE strengthening and balance    Elaine Daring, PT, DPT, LAT, ATC 05/02/24  1:46 PM Phone: [phone] Fax: [phone]

## 2024-05-02 NOTE — Patient Instructions (Signed)
 Access Code: [email or web address] URL: [email or web address] Date: 05/02/2024 Prepared by: Elaine Daring  Exercises - Long Sitting Calf Stretch with Strap  - 1 x daily - 3 reps - 30 seconds hold - Sit to Stand Without Arm Support  - 1 x daily - 3 sets - 10 reps - Standing Hip Abduction with Counter Support  - 1 x daily - 3 sets - 10 reps - Tandem Stance on Noodle  - Single Leg Stance at [facility]

## 2024-05-08 ENCOUNTER — Ambulatory Visit: Admitting: Physical Therapy

## 2024-05-08 ENCOUNTER — Other Ambulatory Visit: Payer: Self-pay

## 2024-05-08 ENCOUNTER — Encounter: Payer: Self-pay | Admitting: Physical Therapy

## 2024-05-08 DIAGNOSIS — M79671 Pain in right foot: Secondary | ICD-10-CM

## 2024-05-08 DIAGNOSIS — G8929 Other chronic pain: Secondary | ICD-10-CM

## 2024-05-08 DIAGNOSIS — M25551 Pain in right hip: Secondary | ICD-10-CM | POA: Diagnosis not present

## 2024-05-08 DIAGNOSIS — M79672 Pain in left foot: Secondary | ICD-10-CM | POA: Diagnosis not present

## 2024-05-08 DIAGNOSIS — M25562 Pain in left knee: Secondary | ICD-10-CM | POA: Diagnosis not present

## 2024-05-08 DIAGNOSIS — M6281 Muscle weakness (generalized): Secondary | ICD-10-CM

## 2024-05-08 DIAGNOSIS — M25552 Pain in left hip: Secondary | ICD-10-CM

## 2024-05-08 NOTE — Therapy (Addendum)
 " OUTPATIENT PHYSICAL THERAPY TREATMENT  DISCHARGE   Patient Name: Nicole Padilla MRN: [id number] DOB:[birth year], 68 y.o., female Today's Date: 05/08/2024   END OF SESSION:  PT End of Session - 05/08/24 1303     Visit Number 4    Number of Visits 9    Date for PT Re-Evaluation 05/23/24    Authorization Type [employer] MCR    Authorization Time Period 03/28/2024 - 06/20/2024    Authorization - Visit Number 4    Authorization - Number of Visits 13    Progress Note Due on Visit 10    PT Start Time 1304    PT Stop Time 1343    PT Time Calculation (min) 39 min    Activity Tolerance Patient tolerated treatment well    Behavior During Therapy [facility] for tasks assessed/performed             Past Medical History:  Diagnosis Date   Abdominal wall abscess    Anxiety    Fibromyalgia 2006   H/O gastric bypass    HTN (hypertension)    Mixed hyperlipidemia 11/25/2013   Past Surgical History:  Procedure Laterality Date   APPENDECTOMY     EXPLORATORY LAPAROTOMY  2006   post bypass, had intestinal preforation   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     JEJUNOSTOMY REVISION  08/18/2005   salivary stones removed     TONSILLECTOMY     WISDOM TOOTH EXTRACTION     Patient Active Problem List   Diagnosis Date Noted   Squamous cell carcinoma of forehead 04/19/2024   Depressive disorder 04/19/2024   Anxiety 02/22/2024   Osteopenia of neck of right femur 02/22/2024   Elevated lipoprotein(a) 02/22/2024   Insulin  resistance 02/22/2024   COVID-19 11/04/2021   Rotator cuff impingement syndrome of left shoulder 06/18/2021   B12 deficiency 06/03/2021   Iron deficiency 06/03/2021   Obesity 07/17/2018   Essential hypertension, benign 12/31/2014   Vitamin D  deficiency 12/31/2014   Mixed hyperlipidemia 11/25/2013   Fibromyalgia    H/O gastric bypass     PCP: Job Lukes, PA  REFERRING PROVIDER: Joane Artist RAMAN, MD  REFERRING DIAG: Chronic pain of both knees;  Right foot pain; Bilateral hip pain  THERAPY DIAG:  Chronic pain of left knee  Pain of both hip joints  Pain in left foot  Pain in right foot  Muscle weakness (generalized)  Rationale for Evaluation and Treatment: Rehabilitation  ONSET DATE: Chronic (referral date 03/06/2024)   SUBJECTIVE:  SUBJECTIVE STATEMENT: Patient reports her right foot is still bothering her, she can still do her pool workouts. States her foot is still swollen. She states that she can't push off of her foot and it even hurt to brake in her car. She states she has started to walk flat footed again. She does report her hips and knees are feeling better and she was able to walk longer without the hip pain.  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 She did have a cortisone injection with great results for 3 months. Then she got a second injection without any relief and the doctor did not want to continue with the injections. She also reports pain on top of her feet with walking because she feels like she was walking weird with her left knee. She tried a shoe with an arch but that made the pain worse. Recently she had another  injection in the left knee and top of the left foot with good results. 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 She states she is an active person and would like to stay active, and works 3 jobs where she is on her feet for hours. She does report some balance issues with her left knee, and did have a fall when picking up some heavy boxes and leaned too far forward.   PERTINENT HISTORY: See PMH above  PAIN:  Are you having pain? Yes:  NPRS scale: 2/10 currently since injection Pain location: Left knee Pain description: Constant grind, ratchet  Aggravating factors: Twisting on the left knee Relieving factors: Rest  NPRS scale:  7/10 Pain location: Right foot Pain description: Excruciating, stabbing Aggravating factors: Walking Relieving factors: Rest  NPRS scale: 0/10 currently, 8/10 at worst Pain location: Bilateral hips Pain description: can't go anymore, ache Aggravating factors: Walking extended periods Relieving factors: Rest  PRECAUTIONS: Fall  RED FLAGS: None   WEIGHT BEARING RESTRICTIONS: No  FALLS:  Has patient fallen in last 6 months? Yes. Number of falls 1, was picking up something heavy   PLOF: Independent  PATIENT GOALS: Not to hurt   OBJECTIVE:  Note: Objective measures were completed at Evaluation unless otherwise noted. PATIENT SURVEYS:  PSFS:1 Long distance walking > 1/2 mile: 0 Stairs (1 step at a time): 1 Gardening (dig holes with shovel): 0 Getting in and out of car: 3  MUSCLE LENGTH: Calf flexibility deficit bilaterally  PALPATION: Tender to palpation at left knee medial joint line, bilateral greater trochanter and glute med region  LOWER EXTREMITY ROM:  Active ROM Right eval Left eval  Hip flexion    Hip extension    Hip abduction    Hip adduction    Hip internal rotation    Hip external rotation    Knee flexion 140 135  Knee extension 0 0  Ankle dorsiflexion    Ankle plantarflexion    Ankle inversion    Ankle eversion     (Blank rows = not tested)  LOWER EXTREMITY MMT:  MMT Right eval Left eval Right 05/08/2024  Hip flexion 4 4   Hip extension     Hip abduction 4- 4-   Hip adduction     Hip internal rotation     Hip external rotation     Knee flexion 5 5   Knee extension 5 5   Ankle dorsiflexion 5 5 5   Ankle plantarflexion     Ankle inversion   4  Ankle eversion   5   (Blank rows = not tested)  FUNCTIONAL TESTS:  5 times sit to stand: 16 seconds SLS: < 5 sec each  04/25/2024: left: 9 seconds, right: 4 seconds Tandem stance: able to maintain > 15 seconds  GAIT: Assistive device utilized: None Level of assistance: Complete  Independence Comments: Trendelenburg bilaterally  TREATMENT [facility] Adult PT Treatment:                                                DATE: 05/08/2024 Intermetatarsal and tarsal mobs on right Passive ankle, foot, toe motion Longsitting ankle inversion with ball 2 x 10 x 5 sec Seated heel raise with ball between heels 2 x 10  Discussed continuing with her aquatic HEP and land based HEP to tolerance. Discussed possible posterior tib involvement. Discussed following-up with her doctor regarding right for possible repeat injection and other possible treatments. Discussed her work and trying to find time to take seated breaks.   PATIENT EDUCATION:  Education details: HEP update Person educated: Patient Education method: Explanation, Demonstration, Tactile cues, Verbal cues, and Handouts Education comprehension: verbalized understanding, returned demonstration, verbal cues required, tactile cues required, and needs further education  HOME EXERCISE PROGRAM: Access Code: [email or web address]    ASSESSMENT: CLINICAL IMPRESSION: Patient tolerated therapy well with no adverse effects. Patient arrives continuing to report increased right foot pain especially with walking or any weight bearing task. Majority of her pain seems to be on the dorsal aspect but also at the course of the posterior tib tendon on the medial aspect of the foot and ankle, and she did report pain with muscle testing for ankle inversion. Therapy incorporated exercises to target the posterior tib with fair tolerance. Majority of session spent discussing her right foot pain ways she can modify her work activity to reduce amount of continuous time she is on her feet. She does report improvement in her walking tolerance regarding her hips. She was provided with exercises to focus on posterior tib and encouraged to continue with her aquatic  program. Patient would benefit from continued skilled PT to progress mobility and strength in order to reduce pain and maximize functional ability.   Eval: Patient is a 68 y.o. female who was seen today for physical therapy evaluation and treatment for chronic left knee, bilateral feet and hip pain. She does demonstrate good motion of the left knee but with some does have pain along medial joint line, gross strength deficits of her hips with gait deviation, bilateral calf tightness, and balance impairments. Her left knee pain does seem consistent with arthritic related knee pain while her hip pain seems more consistent gluteal related pain.  OBJECTIVE IMPAIRMENTS: Abnormal gait, decreased activity tolerance, decreased balance, decreased strength, and pain.   ACTIVITY LIMITATIONS: lifting, standing, squatting, sleeping, stairs, transfers, and locomotion level  PARTICIPATION LIMITATIONS: meal prep, cleaning, shopping, community activity, occupation, and yard work  PERSONAL FACTORS: Fitness, Past/current experiences, and Time since onset of injury/illness/exacerbation are also affecting patient's functional outcome.    GOALS: Goals reviewed with patient? Yes  SHORT TERM GOALS: Target date: 04/25/2024  Patient will be I with initial HEP in order to progress with therapy. Baseline: HEP provided at eval 04/25/2024: independent with initial HEP Goal status: MET  2.  Patient will report knee and foot pain </= 2/10 and hip pain </= 5/10 wit extended periods of walking to reduce functional limitations Baseline: knee and foot pain 2/10, hip pain 8/10 04/25/2024: 2/10 knee and foot, continue increased pain with hips while walking Goal status: ONGOING  LONG TERM GOALS: Target date: 05/23/2024  Patient will be I with final HEP to maintain progress from PT. Baseline: HEP provided at eval Goal status: INITIAL  2.  Patient will report PSFS >/= 5 in order to indicate an improvement in functional  status Baseline: 1 Goal status: INITIAL  3.  Patient will demonstrate hip strength >/= 4/5 MMT to improve her walking tolerance Baseline: grossly 4-/5 MMT Goal status: INITIAL  4.  Patient will demonstrate 5xSTS </= 10 seconds in order to indicate improved strength, mobility, and reduced fall risk Baseline: 16 seconds Goal status: INITIAL  5.  Patient will exhibit SLS >/= 10 seconds each to improve balance and stability with walking Baseline: < 5 seconds bilaterally Goal status: INITIAL   PLAN: PT FREQUENCY: 1x/week  PT DURATION: 8 weeks  PLANNED INTERVENTIONS: 97164- PT Re-evaluation, 97750- Physical Performance Testing, 97110-Therapeutic exercises, 97530- Therapeutic activity, 97112- Neuromuscular re-education, 97535- Self Care, 02859- Manual therapy, [phone]- Gait training, [phone]- Ionotophoresis 4mg /ml Dexamethasone , 79439 (1-2 muscles), 20561 (3+ muscles)- Dry Needling, Patient/Family education, Balance training, Stair training, Taping, Joint mobilization, Joint manipulation, Spinal manipulation, Spinal mobilization, Cryotherapy, and Moist heat  PLAN FOR NEXT SESSION: Review HEP and progress PRN, manual/mobs for left knee, continue progression of LE strengthening and balance    Elaine Daring, PT, DPT, LAT, ATC 05/08/24  2:20 PM Phone: [phone] Fax: [phone]   PHYSICAL THERAPY DISCHARGE SUMMARY  Visits from Start of Care: 4  Current functional level related to goals / functional outcomes: See above   Remaining deficits: See above   Education / Equipment: HEP   Patient agrees to discharge. Patient goals were not met. Patient is being discharged due to not returning since the last visit.  Elaine Daring, PT, DPT, LAT, ATC 10/31/2024  10:23 AM Phone: [phone] Fax: [phone]   "

## 2024-05-08 NOTE — Patient Instructions (Signed)
 Access Code: [email or web address] URL: [email or web address] Date: 05/08/2024 Prepared by: Elaine Daring  Exercises - Long Sitting Calf Stretch with Strap  - 1 x daily - 3 reps - 30 seconds hold - Sit to Stand Without Arm Support  - 1 x daily - 3 sets - 10 reps - Standing Hip Abduction with Counter Support  - 1 x daily - 3 sets - 10 reps - Tandem Stance on Noodle  - Single Leg Stance at Pool Wall  - Isometric Ankle Inversion  - 1-2 x daily - 2 sets - 10 reps - 5 seconds hold - Seated Calf Raise With Small Ball at Heels  - 1-2 x daily - 2 sets - 10 reps

## 2024-05-10 ENCOUNTER — Ambulatory Visit

## 2024-05-10 ENCOUNTER — Ambulatory Visit: Admitting: Family Medicine

## 2024-05-10 ENCOUNTER — Other Ambulatory Visit: Payer: Self-pay

## 2024-05-10 VITALS — BP 168/102 | HR 79

## 2024-05-10 DIAGNOSIS — M79671 Pain in right foot: Secondary | ICD-10-CM | POA: Diagnosis not present

## 2024-05-10 NOTE — Progress Notes (Unsigned)
   LILLETTE Ileana Collet, PhD, LAT, ATC acting as a scribe for Artist Lloyd, MD.  Nicole Padilla is a 68 y.o. female who presents to [facility] at [facility] today for R foot pain. Pt was last seen for her foot on 03/06/24 and was given a R medial midfoot steroid injection.  Today, pt c/o R foot pain continues. She has been working on her balance w/ PT, and the shifting in her foot, irritated her R foot.  She is leaving Wednesday for the start of a busy work season.   Dx testing: 02/22/24 Labs  07/26/22 DEXA  Pertinent review of systems: ***  Relevant historical information: ***   Exam:  There were no vitals taken for this visit. General: Well Developed, well nourished, and in no acute distress.   MSK: ***    Lab and Radiology Results No results found for this or any previous visit (from the past 72 hours). No results found.     Assessment and Plan: 68 y.o. female with ***   PDMP not reviewed this encounter. No orders of the defined types were placed in this encounter.  No orders of the defined types were placed in this encounter.    Discussed warning signs or symptoms. Please see discharge instructions. Patient expresses understanding.   ***

## 2024-05-10 NOTE — Patient Instructions (Signed)
 Thank you for coming in today.   You received an injection today. Seek immediate medical attention if the joint becomes red, extremely painful, or is oozing fluid.   Check back as needed

## 2024-05-14 ENCOUNTER — Other Ambulatory Visit: Payer: Self-pay | Admitting: Physician Assistant

## 2024-05-15 ENCOUNTER — Encounter: Admitting: Physical Therapy

## 2024-05-22 ENCOUNTER — Encounter: Admitting: Physical Therapy

## 2024-05-29 ENCOUNTER — Encounter: Admitting: Physical Therapy

## 2024-06-24 ENCOUNTER — Other Ambulatory Visit: Payer: Self-pay | Admitting: Physician Assistant

## 2024-07-11 ENCOUNTER — Other Ambulatory Visit: Payer: Self-pay

## 2024-07-11 ENCOUNTER — Ambulatory Visit (INDEPENDENT_AMBULATORY_CARE_PROVIDER_SITE_OTHER)

## 2024-07-11 ENCOUNTER — Ambulatory Visit: Admitting: Family Medicine

## 2024-07-11 VITALS — BP 178/80 | HR 90 | Ht 63.0 in | Wt 228.0 lb

## 2024-07-11 DIAGNOSIS — M79641 Pain in right hand: Secondary | ICD-10-CM | POA: Diagnosis not present

## 2024-07-11 DIAGNOSIS — M25562 Pain in left knee: Secondary | ICD-10-CM | POA: Diagnosis not present

## 2024-07-11 DIAGNOSIS — G894 Chronic pain syndrome: Secondary | ICD-10-CM

## 2024-07-11 DIAGNOSIS — G8929 Other chronic pain: Secondary | ICD-10-CM

## 2024-07-11 MED ORDER — OXYCODONE HCL 5 MG PO TABS: 5.0000 mg | ORAL_TABLET | Freq: Every evening | ORAL | 0 refills | Status: AC | PRN

## 2024-07-11 NOTE — Patient Instructions (Addendum)
Thank you for coming in today.   You received an injection today. Seek immediate medical attention if the joint becomes red, extremely painful, or is oozing fluid.   Please get an Xray today before you leave   Check back in 1 month   

## 2024-07-11 NOTE — Progress Notes (Signed)
 LILLETTE Ileana Collet, PhD, LAT, ATC acting as a scribe for Artist Lloyd, MD.  Nicole Padilla is a 68 y.o. female who presents to [facility] at [facility] today for f/u R foot pain. Pt was last seen by Dr. Lloyd on 05/10/24 and was given a R dorsal midfoot steroid injection and advised to use a post-op shoe and Voltaren  gel. Last L knee injection, 03/06/24.   Today, pt reports R foot injection was not as long lasting as the 1st attempt. Foot pain was exacerbated from working on her balance w/ PT. Pain continues, not as severe. L knee pain returned 3 months after prior injections.   She notes chronic multifactorial pain.  She is taking Tylenol and ibuprofen and amitriptyline  but has times where the pain is quite severe and limits her ability to sleep.  She is interested in alternative medications that she could use sporadically.  Additionally she notes an injury to her right fourth digit in her hand.  She thinks perhaps she may have broken it the other day.  She does note some pain and swelling.  Dx testing: 03/06/24 R foot and R & L knee XR  02/22/24 Labs  07/26/22 DEXA  Pertinent review of systems: No fevers or chills  Relevant historical information: Hypertension   Exam:  BP (!) 178/80   Pulse 90   Ht 5' 3 (1.6 m)   Wt 228 lb (103.4 kg)   SpO2 98%   BMI 40.39 kg/m  General: Well Developed, well nourished, and in no acute distress.   MSK: Left knee mild effusion normal-appearing otherwise normal motion varicosities present.  Right fourth digit some bruising is present around the DIP.  Tender palpation in this region.    Lab and Radiology Results  Procedure: Real-time Ultrasound Guided Injection of left knee joint superior lateral patella space Device: Philips Affiniti 50G/GE Logiq Images permanently stored and available for review in PACS Verbal informed consent obtained.  Discussed risks and benefits of procedure. Warned about infection, bleeding, hyperglycemia  damage to structures among others. Patient expresses understanding and agreement Time-out conducted.   Noted no overlying erythema, induration, or other signs of local infection.   Skin prepped in a sterile fashion.   Local anesthesia: Topical Ethyl chloride.   With sterile technique and under real time ultrasound guidance: 40 mg of Kenalog and 2 mL of Marcaine  injected into knee joint. Fluid seen entering the joint capsule.   Completed without difficulty   Pain immediately resolved suggesting accurate placement of the medication.   Advised to call if fevers/chills, erythema, induration, drainage, or persistent bleeding.   Images permanently stored and available for review in the ultrasound unit.  Impression: Technically successful ultrasound guided injection.   X-ray images right ring finger obtained today personally and independently interpreted. Degenerative changes present especially at the fourth DIP.  On lateral view she does have a possible fracture of an osteophyte dorsally.  She has no large typical finger fracture. Await formal radiology review     Assessment and Plan: 68 y.o. 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  Plan for steroid injection today.  Right fourth DIP pain this will act more like a contusion than a fracture.  Plan for stack splint.  Overall pain difficult to manage.  I did prescribe a load dose oxycodone  that she can use in addition to Tylenol if needed.  She should try using less ibuprofen especially given history of gastric bypass.  PDMP reviewed during this encounter. Orders Placed This Encounter  Procedures   US  LIMITED JOINT SPACE STRUCTURES LOW LEFT(NO LINKED CHARGES)    Reason for Exam (SYMPTOM  OR DIAGNOSIS REQUIRED):   left knee pain    Preferred imaging location?:   [facility] Finger Ring Right    Standing Status:   Future    Number of Occurrences:   1    Expiration Date:   07/11/2025     Reason for Exam (SYMPTOM  OR DIAGNOSIS REQUIRED):   right hand pain    Preferred imaging location?:   [facility] ordered this encounter  Medications   oxyCODONE  (ROXICODONE ) 5 MG immediate release tablet    Sig: Take 1 tablet (5 mg total) by mouth at bedtime as needed for severe pain (pain score 7-10).    Dispense:  15 tablet    Refill:  0     Discussed warning signs or symptoms. Please see discharge instructions. Patient expresses understanding.   The above documentation has been reviewed and is accurate and complete Artist Lloyd, M.D.

## 2024-07-17 ENCOUNTER — Ambulatory Visit: Payer: Self-pay | Admitting: Family Medicine

## 2024-07-17 NOTE — Progress Notes (Signed)
 Right ring finger x-ray shows advanced arthritis at the end of the 4th and 5th digits.

## 2024-07-24 ENCOUNTER — Other Ambulatory Visit: Payer: Self-pay | Admitting: Physician Assistant

## 2024-07-25 ENCOUNTER — Ambulatory Visit: Admitting: Physician Assistant

## 2024-07-25 ENCOUNTER — Other Ambulatory Visit: Payer: Self-pay | Admitting: Physician Assistant

## 2024-07-26 ENCOUNTER — Other Ambulatory Visit: Payer: Self-pay | Admitting: Physician Assistant

## 2024-07-26 DIAGNOSIS — Z1231 Encounter for screening mammogram for malignant neoplasm of breast: Secondary | ICD-10-CM

## 2024-07-30 ENCOUNTER — Ambulatory Visit (INDEPENDENT_AMBULATORY_CARE_PROVIDER_SITE_OTHER): Admitting: Physician Assistant

## 2024-07-30 ENCOUNTER — Encounter: Payer: Self-pay | Admitting: Physician Assistant

## 2024-07-30 VITALS — BP 130/82 | HR 78 | Wt 227.0 lb

## 2024-07-30 DIAGNOSIS — I10 Essential (primary) hypertension: Secondary | ICD-10-CM

## 2024-07-30 DIAGNOSIS — M159 Polyosteoarthritis, unspecified: Secondary | ICD-10-CM | POA: Diagnosis not present

## 2024-07-30 DIAGNOSIS — E782 Mixed hyperlipidemia: Secondary | ICD-10-CM

## 2024-07-30 DIAGNOSIS — Z9884 Bariatric surgery status: Secondary | ICD-10-CM

## 2024-07-30 DIAGNOSIS — Z1211 Encounter for screening for malignant neoplasm of colon: Secondary | ICD-10-CM

## 2024-07-30 DIAGNOSIS — E559 Vitamin D deficiency, unspecified: Secondary | ICD-10-CM | POA: Diagnosis not present

## 2024-07-30 DIAGNOSIS — F419 Anxiety disorder, unspecified: Secondary | ICD-10-CM

## 2024-07-30 LAB — COMPREHENSIVE METABOLIC PANEL WITH GFR
ALT: 21 U/L (ref 0–35)
AST: 18 U/L (ref 0–37)
Albumin: 4.4 g/dL (ref 3.5–5.2)
Alkaline Phosphatase: 57 U/L (ref 39–117)
BUN: 26 mg/dL — ABNORMAL HIGH (ref 6–23)
CO2: 29 meq/L (ref 19–32)
Calcium: 9.5 mg/dL (ref 8.4–10.5)
Chloride: 104 meq/L (ref 96–112)
Creatinine, Ser: 0.78 mg/dL (ref 0.40–1.20)
GFR: 77.85 mL/min (ref >60.00)
Glucose, Bld: 98 mg/dL (ref 70–99)
Potassium: 5 meq/L (ref 3.5–5.1)
Sodium: 140 meq/L (ref 135–145)
Total Bilirubin: 0.4 mg/dL (ref 0.2–1.2)
Total Protein: 6.8 g/dL (ref 6.0–8.3)

## 2024-07-30 LAB — LIPID PANEL
Cholesterol: 167 mg/dL (ref 0–200)
HDL: 75.9 mg/dL (ref >39.00)
LDL Cholesterol: 79 mg/dL (ref 0–99)
NonHDL: 91.12
Total CHOL/HDL Ratio: 2
Triglycerides: 61 mg/dL (ref 0.0–149.0)
VLDL: 12.2 mg/dL (ref 0.0–40.0)

## 2024-07-30 LAB — CBC WITH DIFFERENTIAL/PLATELET
Basophils Absolute: 0 K/uL (ref 0.0–0.1)
Basophils Relative: 0.5 % (ref 0.0–3.0)
Eosinophils Absolute: 0.1 K/uL (ref 0.0–0.7)
Eosinophils Relative: 2.2 % (ref 0.0–5.0)
HCT: 42.3 % (ref 36.0–46.0)
Hemoglobin: 13.8 g/dL (ref 12.0–15.0)
Lymphocytes Relative: 29.4 % (ref 12.0–46.0)
Lymphs Abs: 1.5 K/uL (ref 0.7–4.0)
MCHC: 32.6 g/dL (ref 30.0–36.0)
MCV: 88.7 fl (ref 78.0–100.0)
Monocytes Absolute: 0.3 K/uL (ref 0.1–1.0)
Monocytes Relative: 5.7 % (ref 3.0–12.0)
Neutro Abs: 3.3 K/uL (ref 1.4–7.7)
Neutrophils Relative %: 62.2 % (ref 43.0–77.0)
Platelets: 212 K/uL (ref 150.0–400.0)
RBC: 4.77 Mil/uL (ref 3.87–5.11)
RDW: 14.2 % (ref 11.5–15.5)
WBC: 5.2 K/uL (ref 4.0–10.5)

## 2024-07-30 LAB — VITAMIN D 25 HYDROXY (VIT D DEFICIENCY, FRACTURES): VITD: 34.33 ng/mL (ref 30.00–100.00)

## 2024-07-30 NOTE — Patient Instructions (Signed)
 Wt Readings from Last 4 Encounters:  07/30/24 227 lb (103 kg)  07/11/24 228 lb (103.4 kg)  04/19/24 221 lb (100.2 kg)  04/06/24 225 lb (102.1 kg)

## 2024-07-30 NOTE — Progress Notes (Signed)
 Nicole Padilla is a 68 y.o. female here for a follow up of a pre-existing problem.  History of Present Illness:   Chief Complaint  Patient presents with   Hypertension    Discussed the use of AI scribe software for clinical note transcription with the patient, who gave verbal consent to proceed.  History of Present Illness   Nicole Padilla is a 68 year old female with osteoarthritis who presents with worsening knee pain and balance issues.  She experiences worsening knee pain and balance issues. Physical therapy for muscle strengthening was initially beneficial, but balance exercises increased knee pain. Recent injections have been less effective, with the last providing only one day of relief. 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  She takes Oxycontin  with Tylenol for pain management but is concerned about the limited supply and potential perception of medication abuse. She has stopped Naltrexone  and continues Wellbutrin , discontinuing Buspar .  Bone spurs on her feet prevent comfortable flexing, requiring uncomfortable shoes. This, along with knee pain, has altered her gait, worsening hip arthritis.  Her family history includes severe osteoarthritis in her mother and grandmother. She is concerned about her health trajectory as she approaches 70.  She has a history of Roux-en-Y gastric bypass and intestinal perforations, surgically repaired. She takes Crestor  three times a week, though prescribed daily, and has stopped vitamin D  supplementation due to lack of refill approval.        Past Medical History:  Diagnosis Date   Abdominal wall abscess    Anxiety    Fibromyalgia 2006   H/O gastric bypass    HTN (hypertension)    Mixed hyperlipidemia 11/25/2013     Social History   Tobacco Use   Smoking status: Never   Smokeless tobacco: Never  Vaping Use   Vaping status: Never Used  Substance Use Topics   Alcohol use: Yes     Comment: rarely   Drug use: Never    Past Surgical History:  Procedure Laterality Date   APPENDECTOMY     EXPLORATORY LAPAROTOMY  2006   post bypass, had intestinal preforation   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     JEJUNOSTOMY REVISION  08/18/2005   salivary stones removed     TONSILLECTOMY     WISDOM TOOTH EXTRACTION      Family History  Problem Relation Age of Onset   Osteoporosis Mother    Emphysema Mother    Alzheimer's disease Father    Heart disease Father    Multiple sclerosis Sister     Allergies  Allergen Reactions   E-Mycin [Erythromycin]     GI upset    Current Medications:   Current Outpatient Medications:    amitriptyline  (ELAVIL ) 10 MG tablet, Take 1 tablet (10 mg total) by mouth at bedtime., Disp: 90 tablet, Rfl: 1   ascorbic acid (VITAMIN C) 500 MG tablet, Take 500 mg by mouth daily., Disp: , Rfl:    buPROPion  (WELLBUTRIN  XL) 150 MG 24 hr tablet, Take 1 tablet (150 mg total) by mouth daily., Disp: 30 tablet, Rfl: 2   Calcium  Carbonate-Vit D-Min (CALCIUM  1200 PO), Take by mouth daily., Disp: , Rfl:    Cholecalciferol (VITAMIN D -3) 125 MCG (5000 UT) TABS, Take 5,000 Int'l Units/day by mouth daily., Disp: , Rfl:    Fexofenadine HCl (ALLEGRA PO), Take by mouth. (Patient taking differently: Take by mouth as needed.), Disp: , Rfl:    fluticasone  (  FLONASE ) 50 MCG/ACT nasal spray, Place 1 spray into both nostrils daily., Disp: 15.8 mL, Rfl: 0   Magnesium Glycinate 100 MG CAPS, Take 100 Int'l Units/hr by mouth in the morning, at noon, and at bedtime. 300 once a day, Disp: , Rfl:    Multiple Vitamins-Minerals (MULTIVITAMIN WITH MINERALS) tablet, Take 1 tablet by mouth daily., Disp: , Rfl:    olmesartan  (BENICAR ) 20 MG tablet, Take 1 tablet (20 mg total) by mouth daily., Disp: 90 tablet, Rfl: 1   oxyCODONE  (ROXICODONE ) 5 MG immediate release tablet, Take 1 tablet (5 mg total) by mouth at bedtime as needed for severe pain (pain score  7-10)., Disp: 15 tablet, Rfl: 0   pantoprazole  (PROTONIX ) 40 MG tablet, Take 1 tablet (40 mg total) by mouth daily., Disp: 30 tablet, Rfl: 3   rosuvastatin  (CRESTOR ) 5 MG tablet, Take 1 tablet daily, Disp: 90 tablet, Rfl: 1   Vitamin D , Ergocalciferol , (DRISDOL ) 1.25 MG (50000 UNIT) CAPS capsule, Take 1 capsule (50,000 Units total) by mouth every 7 (seven) days., Disp: 4 capsule, Rfl: 0   busPIRone  (BUSPAR ) 10 MG tablet, Take 10 mg by mouth daily. (Patient not taking: Reported on 07/30/2024), Disp: , Rfl:    cyanocobalamin  (VITAMIN B12) 1000 MCG tablet, Take 1,000 mcg by mouth daily. (Patient not taking: Reported on 07/30/2024), Disp: , Rfl:    Review of Systems:   Negative unless otherwise specified per HPI.  Vitals:   Vitals:   07/30/24 1008  BP: 130/82  Pulse: 78  SpO2: 97%  Weight: 227 lb (103 kg)     Body mass index is 40.21 kg/m.  Physical Exam:   Physical Exam Vitals and nursing note reviewed.  Constitutional:      General: She is not in acute distress.    Appearance: She is well-developed. She is not ill-appearing or toxic-appearing.  Cardiovascular:     Rate and Rhythm: Normal rate and regular rhythm.     Pulses: Normal pulses.     Heart sounds: Normal heart sounds, S1 normal and S2 normal.  Pulmonary:     Effort: Pulmonary effort is normal.     Breath sounds: Normal breath sounds.  Skin:    General: Skin is warm and dry.  Neurological:     Mental Status: She is alert.     GCS: GCS eye subscore is 4. GCS verbal subscore is 5. GCS motor subscore is 6.  Psychiatric:        Attention and Perception: Attention normal.        Mood and Affect: Affect is tearful.        Speech: Speech normal.        Behavior: Behavior normal. Behavior is cooperative.     Assessment and Plan:   Assessment and Plan    Generalized osteoarthritis  Chronic osteoarthritis with significant pain. Physical therapy worsened knee pain. Injections provided minimal relief. Bone spurs  affect gait and hip arthritis. Oxycontin  offers limited relief. Considering low-dose radiation therapy study. - Continue home exercises twice a week. - Discontinue physical therapy. - pain prescription per sports medicine - Consider alternative pain management, including low-dose radiation therapy study. - Schedule bone density scan in October.  Dyslipidemia Managed with Crestor , no significant side effects. Open to medication adjustment if needed. - Continue Crestor  5 mg daily - Monitor for muscle or joint pain. - Consider alternative statin if pain develops.  Anxiety Improved mental health with Wellbutrin . - Continue Wellbutrin .  Vitamin D  deficiency Refill not approved, no  perceived difference with supplements. - Reassess vitamin D  levels.  General Health Maintenance Fear of colonoscopy, open to Cologuard. - Provide information and order Cologuard test. - Discuss results and potential colonoscopy if positive.      Special screening for malignant neoplasms, colon She is reluctant to complete a colonoscopy Agreeable to cologuard    Lucie Buttner, PA-C

## 2024-07-31 ENCOUNTER — Ambulatory Visit: Payer: Self-pay | Admitting: Physician Assistant

## 2024-07-31 DIAGNOSIS — R195 Other fecal abnormalities: Secondary | ICD-10-CM

## 2024-08-08 ENCOUNTER — Ambulatory Visit: Admission: RE | Admit: 2024-08-08 | Discharge: 2024-08-08 | Disposition: A | Source: Ambulatory Visit

## 2024-08-08 DIAGNOSIS — Z1231 Encounter for screening mammogram for malignant neoplasm of breast: Secondary | ICD-10-CM

## 2024-08-09 NOTE — Progress Notes (Unsigned)
   LILLETTE Ileana Collet, PhD, LAT, ATC acting as a scribe for Artist Lloyd, MD.  Nicole Padilla is a 68 y.o. female who presents to [facility] at [facility] today for exacerbation of her R foot pain. Pt was last seen for her foot on 05/10/24 and was given a R dorsal midfoot steroid injection  Today, pt reports ***  Dx testing: 03/06/24 R foot XR 02/22/24 Labs  07/26/22 DEXA  Pertinent review of systems: ***  Relevant historical information: ***   Exam:  There were no vitals taken for this visit. General: Well Developed, well nourished, and in no acute distress.   MSK: ***    Lab and Radiology Results No results found for this or any previous visit (from the past 72 hours). No results found.     Assessment and Plan: 68 y.o. female with ***   PDMP not reviewed this encounter. No orders of the defined types were placed in this encounter.  No orders of the defined types were placed in this encounter.    Discussed warning signs or symptoms. Please see discharge instructions. Patient expresses understanding.   ***

## 2024-08-10 ENCOUNTER — Other Ambulatory Visit: Payer: Self-pay

## 2024-08-10 ENCOUNTER — Ambulatory Visit: Admitting: Family Medicine

## 2024-08-10 ENCOUNTER — Encounter: Payer: Self-pay | Admitting: Family Medicine

## 2024-08-10 ENCOUNTER — Telehealth: Payer: Self-pay

## 2024-08-10 VITALS — BP 162/86 | HR 67 | Ht 63.0 in | Wt 231.0 lb

## 2024-08-10 DIAGNOSIS — G8929 Other chronic pain: Secondary | ICD-10-CM | POA: Diagnosis not present

## 2024-08-10 DIAGNOSIS — M1712 Unilateral primary osteoarthritis, left knee: Secondary | ICD-10-CM | POA: Diagnosis not present

## 2024-08-10 DIAGNOSIS — M79671 Pain in right foot: Secondary | ICD-10-CM | POA: Diagnosis not present

## 2024-08-10 DIAGNOSIS — M25562 Pain in left knee: Secondary | ICD-10-CM | POA: Diagnosis not present

## 2024-08-10 NOTE — Telephone Encounter (Signed)
 Patient scheduled for 11/13/24, can call patient to come in sooner   Zilretta  authorized for left knee NO PRE CERT REQUIRED  Copay $20 Patient coinsurance responsibility 20% Deductible does not apply OOP MAX $3900 has met $495.15 Once OOP has been met coverage goes to 100% and copay will no longer apply Reference # [id number]

## 2024-08-10 NOTE — Telephone Encounter (Signed)
 Ran for left knee zilretta  case ID [id number]

## 2024-08-10 NOTE — Patient Instructions (Addendum)
 Thank you for coming in today.   You received an injection today. Seek immediate medical attention if the joint becomes red, extremely painful, or is oozing fluid.   We are checking benefits for Zilretta  for your left knee, we will be in touch after we hear back about coverage.   See you back in 3 months for consideration of repeat foot injection.

## 2024-08-10 NOTE — Telephone Encounter (Signed)
 Please check insurance benefits for Zilretta  injection for LEFT knee OA.   If not covered, check for gel shots.

## 2024-08-13 NOTE — Telephone Encounter (Signed)
 Noted

## 2024-08-20 ENCOUNTER — Encounter: Payer: Self-pay | Admitting: Radiology

## 2024-08-21 LAB — COLOGUARD: COLOGUARD: POSITIVE — AB

## 2024-10-02 ENCOUNTER — Encounter: Payer: Medicare Other | Admitting: Nurse Practitioner

## 2024-10-05 ENCOUNTER — Other Ambulatory Visit: Payer: Self-pay | Admitting: Physician Assistant

## 2024-10-05 DIAGNOSIS — I10 Essential (primary) hypertension: Secondary | ICD-10-CM

## 2024-10-08 NOTE — Progress Notes (Unsigned)
"       ° °  LILLETTE Ileana Collet, PhD, LAT, ATC acting as a scribe for Artist Lloyd, MD.  Nicole Padilla is a 68 y.o. female who presents to [facility] at [facility] today for exacerbation of her L knee pain. Pt was last seen for her knee on 07/11/24 and was given a steroid injection.  Today, pt reports ***  Pertinent review of systems: ***  Relevant historical information: ***   Exam:  There were no vitals taken for this visit. General: Well Developed, well nourished, and in no acute distress.   MSK: ***    Lab and Radiology Results No results found for this or any previous visit (from the past 72 hours). No results found.     Assessment and Plan: 68 y.o. female with ***   PDMP not reviewed this encounter. No orders of the defined types were placed in this encounter.  No orders of the defined types were placed in this encounter.    Discussed warning signs or symptoms. Please see discharge instructions. Patient expresses understanding.   ***  "

## 2024-10-09 ENCOUNTER — Encounter: Payer: Self-pay | Admitting: Family Medicine

## 2024-10-09 ENCOUNTER — Ambulatory Visit: Admitting: Family Medicine

## 2024-10-09 VITALS — BP 150/90 | HR 60 | Ht 63.0 in | Wt 242.0 lb

## 2024-10-09 DIAGNOSIS — G8929 Other chronic pain: Secondary | ICD-10-CM

## 2024-10-09 DIAGNOSIS — M1712 Unilateral primary osteoarthritis, left knee: Secondary | ICD-10-CM | POA: Diagnosis not present

## 2024-10-09 DIAGNOSIS — M25562 Pain in left knee: Secondary | ICD-10-CM | POA: Diagnosis not present

## 2024-10-09 MED ORDER — TRIAMCINOLONE ACETONIDE 32 MG IX SRER
32.0000 mg | Freq: Once | INTRA_ARTICULAR | Status: AC
  Administered 2024-10-09: 32 mg via INTRA_ARTICULAR

## 2024-10-09 NOTE — Patient Instructions (Signed)
 Thank you for coming in today.   You received an injection today. Seek immediate medical attention if the joint becomes red, extremely painful, or is oozing fluid.

## 2024-10-16 ENCOUNTER — Ambulatory Visit: Admitting: Family Medicine

## 2024-10-17 ENCOUNTER — Other Ambulatory Visit: Payer: Self-pay | Admitting: Nurse Practitioner

## 2024-10-17 ENCOUNTER — Other Ambulatory Visit: Payer: Self-pay | Admitting: Physician Assistant

## 2024-10-17 DIAGNOSIS — F321 Major depressive disorder, single episode, moderate: Secondary | ICD-10-CM

## 2024-11-13 ENCOUNTER — Ambulatory Visit: Admitting: Family Medicine

## 2024-11-15 ENCOUNTER — Other Ambulatory Visit: Payer: Self-pay | Admitting: Physician Assistant

## 2024-11-15 DIAGNOSIS — E782 Mixed hyperlipidemia: Secondary | ICD-10-CM

## 2025-01-08 ENCOUNTER — Ambulatory Visit: Admitting: Family Medicine
